# Patient Record
Sex: Male | Born: 1950 | Race: White | Hispanic: No | Marital: Single | Smoking: Never smoker
Health system: Southern US, Community
[De-identification: ages and names within clinical notes are randomized; demographics above are authoritative.]

## PROBLEM LIST (undated history)

## (undated) DIAGNOSIS — R3911 Hesitancy of micturition: Secondary | ICD-10-CM

## (undated) DIAGNOSIS — M75102 Unspecified rotator cuff tear or rupture of left shoulder, not specified as traumatic: Secondary | ICD-10-CM

## (undated) DIAGNOSIS — I712 Thoracic aortic aneurysm, without rupture: Secondary | ICD-10-CM

## (undated) DIAGNOSIS — I6529 Occlusion and stenosis of unspecified carotid artery: Secondary | ICD-10-CM

## (undated) DIAGNOSIS — E785 Hyperlipidemia, unspecified: Secondary | ICD-10-CM

## (undated) DIAGNOSIS — E291 Testicular hypofunction: Secondary | ICD-10-CM

## (undated) DIAGNOSIS — Z8709 Personal history of other diseases of the respiratory system: Secondary | ICD-10-CM

## (undated) DIAGNOSIS — M8580 Other specified disorders of bone density and structure, unspecified site: Secondary | ICD-10-CM

## (undated) DIAGNOSIS — H9319 Tinnitus, unspecified ear: Secondary | ICD-10-CM

## (undated) DIAGNOSIS — I10 Essential (primary) hypertension: Secondary | ICD-10-CM

## (undated) DIAGNOSIS — E559 Vitamin D deficiency, unspecified: Secondary | ICD-10-CM

## (undated) DIAGNOSIS — R351 Nocturia: Secondary | ICD-10-CM

## (undated) DIAGNOSIS — IMO0001 Reserved for inherently not codable concepts without codable children: Secondary | ICD-10-CM

## (undated) DIAGNOSIS — N400 Enlarged prostate without lower urinary tract symptoms: Secondary | ICD-10-CM

## (undated) DIAGNOSIS — M1990 Unspecified osteoarthritis, unspecified site: Secondary | ICD-10-CM

## (undated) DIAGNOSIS — R200 Anesthesia of skin: Secondary | ICD-10-CM

## (undated) DIAGNOSIS — R350 Frequency of micturition: Secondary | ICD-10-CM

## (undated) DIAGNOSIS — S68119A Complete traumatic metacarpophalangeal amputation of unspecified finger, initial encounter: Secondary | ICD-10-CM

## (undated) DIAGNOSIS — M545 Low back pain, unspecified: Secondary | ICD-10-CM

## (undated) DIAGNOSIS — G8929 Other chronic pain: Secondary | ICD-10-CM

## (undated) HISTORY — DX: Other chronic pain: G89.29

## (undated) HISTORY — DX: Vitamin D deficiency, unspecified: E55.9

## (undated) HISTORY — PX: ANTERIOR FUSION CERVICAL SPINE: SUR626

## (undated) HISTORY — DX: Hyperlipidemia, unspecified: E78.5

## (undated) HISTORY — DX: Low back pain, unspecified: M54.50

## (undated) HISTORY — DX: Testicular hypofunction: E29.1

## (undated) HISTORY — DX: Unspecified rotator cuff tear or rupture of left shoulder, not specified as traumatic: M75.102

## (undated) HISTORY — DX: Unspecified osteoarthritis, unspecified site: M19.90

## (undated) HISTORY — DX: Essential (primary) hypertension: I10

## (undated) HISTORY — DX: Other specified disorders of bone density and structure, unspecified site: M85.80

## (undated) HISTORY — DX: Occlusion and stenosis of unspecified carotid artery: I65.29

## (undated) HISTORY — PX: PROSTATE CRYOABLATION: SUR358

## (undated) HISTORY — PX: CARPAL TUNNEL RELEASE: SHX101

## (undated) HISTORY — DX: Low back pain: M54.5

## (undated) HISTORY — PX: ROTATOR CUFF REPAIR: SHX139

## (undated) HISTORY — DX: Thoracic aortic aneurysm, without rupture: I71.2

## (undated) HISTORY — DX: Benign prostatic hyperplasia without lower urinary tract symptoms: N40.0

## (undated) HISTORY — DX: Complete traumatic metacarpophalangeal amputation of unspecified finger, initial encounter: S68.119A

---
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
## 2001-06-09 HISTORY — PX: COLONOSCOPY: SHX174

## 2005-06-05 ENCOUNTER — Encounter: Admission: RE | Admit: 2005-06-05 | Discharge: 2005-06-05 | Payer: Self-pay | Admitting: Family Medicine

## 2005-06-10 ENCOUNTER — Encounter: Admission: RE | Admit: 2005-06-10 | Discharge: 2005-06-10 | Payer: Self-pay | Admitting: Family Medicine

## 2005-09-05 ENCOUNTER — Ambulatory Visit (HOSPITAL_COMMUNITY): Admission: RE | Admit: 2005-09-05 | Discharge: 2005-09-06 | Payer: Self-pay | Admitting: Neurosurgery

## 2007-10-13 ENCOUNTER — Encounter: Admission: RE | Admit: 2007-10-13 | Discharge: 2007-10-13 | Payer: Self-pay | Admitting: Internal Medicine

## 2008-01-27 ENCOUNTER — Ambulatory Visit (HOSPITAL_COMMUNITY): Admission: RE | Admit: 2008-01-27 | Discharge: 2008-01-28 | Payer: Self-pay | Admitting: Specialist

## 2008-09-30 ENCOUNTER — Encounter: Admission: RE | Admit: 2008-09-30 | Discharge: 2008-09-30 | Payer: Self-pay | Admitting: Neurosurgery

## 2008-10-09 ENCOUNTER — Emergency Department (HOSPITAL_COMMUNITY): Admission: EM | Admit: 2008-10-09 | Discharge: 2008-10-09 | Payer: Self-pay | Admitting: Emergency Medicine

## 2009-09-07 HISTORY — PX: OTHER SURGICAL HISTORY: SHX169

## 2010-06-09 HISTORY — PX: SPIROMETRY: SHX456

## 2010-10-22 NOTE — Op Note (Signed)
NAMEFRANCHOT, POLLITT               ACCOUNT NO.:  [id number]   MEDICAL RECORD NO.:  [id number]          PATIENT TYPE:  AMB   LOCATION:  DAY                          FACILITY:  [facility]   PHYSICIAN:  Jene Every, M.D.    DATE OF BIRTH:  1951-05-07   DATE OF PROCEDURE:  01/27/2008  DATE OF DISCHARGE:                               OPERATIVE REPORT   PREOPERATIVE DIAGNOSES:  Massive rotator cuff tear left shoulder with  impingement syndrome.   POSTOPERATIVE DIAGNOSES:  Massive rotator cuff tear left shoulder with  impingement syndrome.   PROCEDURE PERFORMED:  1. Repair of massive rotator cuff tear, left shoulder.  2. Subacromial decompression with acromioplasty.  3. Debridement of labral tear with lavaged of glenohumeral joint.   ANESTHESIA:  General.   ASSISTANT:  Strader.   I used a push lock suture anchor system.   BRIEF HISTORY AND INDICATIONS:  This gentleman is status post a rotator  cuff tear sustained at work.  He is 60 years old, right hand dominant.  Confirmed by MRI.  It was indicated for repair.  The risks and benefits  discussed, including bleeding, infection, suboptimal range of motion,  need for immobilization, prolonged recovery, DVT, PE, and anesthetic  complications, etc.   TECHNIQUE:  The patient in supine in the beach-chair position.  After  the induction of adequate anesthesia and 1 gm of Kefzol, the left  shoulder and upper extremity was prepped and draped in the usual sterile  fashion.  A surgical marker was utilized to delineate the acromion and  an incision was made over the anterolateral aspect of the acromion.  Subcutaneous tissue was dissected.  Electrocautery was utilized to  achieve hemostasis.  The raphe between the anterolateral heads of the  deltoid were subperiosteally elevated over the anterolateral aspect of  the acromion, preserving the attachment.  The CA ligament was divided  and excised.  Acromioplasty was performed with the deltoid  protected  utilizing the oscillating saw.  The glenohumeral joint was debrided and  lavaged.  A massive rotator cuff tear was noted, retracted to the  glenohumeral joint.  The glenohumeral joint showed some arthritic  changes.  This was debrided and this was lavaged.  I then mobilized the  rotator cuff.  Utilizing a combination of subacromial digital lysis and  the Cobb, elevating the anterior aspect of the cuffs attachment to the  glenoid.  Next, a bed in the medial to the greater tuberosity was  prepared utilizing a Baer rongeur and a curette.  Good bleeding bone was  obtained.  There was a tear in the subscap as well.  I then placed two  suture anchors utilizing an awl just lateral to the articular surface  into the bone, and then a 5/5 suture anchor with good purchase, unable  to dislodge.  The suture leaflets were then threaded into the subscap,  the supraspinatus anteriorly and laterally, the first suture anchor and  into the posterior aspect of the supraspinatus with the second suture  anchor.  Two of these were utilized.  The tendon was advanced with the  arm  abducted.  We tied the first leaflet down to the suture anchor,  fixing the subscap.  We then tied the second two down transfixing the  supraspinatus.  This was advanced to the bed just 1-2 mm medial to the  greater tuberosity without excessive tension.  We then crossed the  suture ends and then utilized a push lock over the outer aspect of the  greater tuberosity, preparing it with an awl and then inserting the 4-5  push lock, threading the threads through that.  With tension on that,  that then further matted down the rotator cuff.  With excellent fixation  and coverage of the head, we ranged the shoulder and there was full  coverage noted.  The actual tendon itself once advanced was fairly thick  and of good quality.  The biceps tendon was also intact.  The wound was  then copiously irrigated, and I repaired the raphe with  #1 Vicryl  interrupted figure-of-eight sutures over and through the acromion, subcu  with 2-0 Vicryl simple sutures.  The skin was reapproximated for 4-0  subcuticular Prolene.  The wound reinforced with Steri-Strips.  Sterile  dressing applied.  I placed him in a large abduction pillow, extubated  without difficulty and transported to the recovery room in satisfactory  condition.   The patient tolerated the procedure well with no complications.      Jene Every, M.D.  Electronically Signed     JB/MEDQ  D:  01/27/2008  T:  01/27/2008  Job:  [id number]

## 2010-10-25 NOTE — Op Note (Signed)
Gregory Fisher, Gregory Fisher               ACCOUNT NO.:  [id number]   MEDICAL RECORD NO.:  [id number]          PATIENT TYPE:  AMB   LOCATION:  SDS                          FACILITY:  [facility]   PHYSICIAN:  Henry A. Pool, M.D.    DATE OF BIRTH:  01/31/1951   DATE OF PROCEDURE:  09/05/2005  DATE OF DISCHARGE:                                 OPERATIVE REPORT   SERVICE:  Neurosurgery.   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   PROCEDURE:  C6-7 anterior cervical diskectomy and fusion with allograft and  anterior plating.   SURGEON:  Kathaleen Maser. Pool, M.D.   ASSISTANT:  Tia Alert, M.D.   ANESTHESIA:  General endotracheal.   INDICATION:  Mr. Slatten is a 60 year old male who is status post previous  C5-6 anterior cervical diskectomy and fusion done by another physician  approximately 16 years ago.  The patient presents now with neck and left  upper extremity pain, paresthesias and weakness and tenderness on the left  side with C7 radiculopathy.  Workup demonstrates evidence of a left-sided C6-  7 disk herniation and compression of the spinal cord and exiting left-sided  C7 nerve root.  The patient has been counseled as to his options.  He  decided with a C6-7 anterior cervical diskectomy and fusion, allografting  and anterior plating in hopes of improving his symptoms.   DESCRIPTION OF PROCEDURE:  The patient was taken to the operating room and  placed on the table in a supine position.  After adequate level of  anesthesia was achieved, the patient was positioned supine with his neck  slightly extended and head placed in halter traction.  The patient's  anterior cervical region was prepped and draped sterilely.  A 10 blade was  used to make a linear skin incision overlying the C6-7 interspace.  This was  carried down sharply to the platysma.  The platysma was  then divided  vertically and dissection proceeded along the medial border of the  sternocleidomastoid muscle and carotid sheath.  Trachea and esophagus were  mobilized and retracted towards the left.  The prevertebral fascia was  stripped off the anterior spinal column.  The longus colli muscle was then  elevated bilaterally using electrocautery.  A deep self-retaining retractor  was placed and intraoperative fluoroscopy used; the C6-7 level was  confirmed.  The disk space was then incised with a 15 blade in rectangular  fashion.  A wide disk space cleanout was then achieved using pituitary  rongeurs, forward- and back-biting Carlen curettes, Kerrison rongeurs and a  high-speed drill until all elements of disk were removed down to the level  of the posterior annulus.  The microscope was then brought into the field  and used for microdissection throughout the remainder of the diskectomy.  The remaining aspects of the annulus and osteophytes were removed using a  high-speed drill down to the level of the posterior longitudinal ligament.  The posterior longitudinal ligament was then elevated and resected in  piecemeal fashion using Kerrison rongeurs.  The underlying thecal sac was  then divided and a wide central decompression was then performed,  undercutting the bodies of C6 and C7 using Kerrison rongeurs.  All elements  of disk herniation were resected along the way.  Decompression then  proceeded out into each neural foramen.  A wide anterior foraminotomy was  then performed along the previously exciting C7 nerve roots bilaterally.  Wound was then irrigated with antibiotic solution.  Gelfoam was placed  topically for hemostasis, which was found to be good.  A 6-mm LifeNet  allograft wedge was then impacted into place and recessed approximately 1 mm  from the anterior cortical margin.  A 25-mm Prestige anterior cervical plate  was then placed over the C6 and C7 levels.  This was then  attached under  fluoroscopic guidance using 13-mm variable-angle screws, 2 each at both  levels.  All 4 screws were given a final tightening and found to be solid  within bone.  Locking screws were then engaged.  Final images revealed good  position of bone grafts and hardware at proper operative levels and normal  alignment of the spine.  Wound was then irrigated with antibiotic solution.  Gelfoam was placed topically for hemostasis, which was found to be good.  Wound was then closed in typical fashion.  Steri-Strips and sterile dressing  were applied.  There were no apparent complications.  The patient tolerated  the procedure well and he returns to the recovery room postoperatively.           ______________________________  Kathaleen Maser Pool, M.D.     HAP/MEDQ  D:  09/05/2005  T:  09/06/2005  Job:  [id number]

## 2011-03-07 LAB — BASIC METABOLIC PANEL
CO2: 31
Calcium: 10
Creatinine, Ser: 1.18
GFR calc Af Amer: >60
GFR calc non Af Amer: >60
Sodium: 146 — ABNORMAL HIGH

## 2011-03-07 LAB — DIFFERENTIAL
Lymphocytes Relative: 21
Lymphs Abs: 1.2
Monocytes Absolute: 0.5
Monocytes Relative: 9
Neutro Abs: 3.9
Neutrophils Relative %: 69

## 2011-03-07 LAB — CBC
Hemoglobin: 14.4
MCHC: 34.4
RBC: 4.49
WBC: 5.7

## 2011-04-10 HISTORY — PX: US ECHOCARDIOGRAPHY: HXRAD669

## 2011-11-26 ENCOUNTER — Other Ambulatory Visit: Payer: Self-pay | Admitting: Specialist

## 2011-11-26 DIAGNOSIS — M25512 Pain in left shoulder: Secondary | ICD-10-CM

## 2011-12-04 ENCOUNTER — Ambulatory Visit
Admission: RE | Admit: 2011-12-04 | Discharge: 2011-12-04 | Disposition: A | Discharge status: Home / Self Care | Payer: BC Managed Care – PPO | Source: Ambulatory Visit | Attending: Specialist | Admitting: Specialist

## 2011-12-04 DIAGNOSIS — M25512 Pain in left shoulder: Secondary | ICD-10-CM

## 2011-12-04 MED ORDER — IOHEXOL 180 MG/ML  SOLN
1.5000 mL | Freq: Once | INTRAMUSCULAR | Status: AC | PRN
  Administered 2011-12-04: 1.5 mL via INTRA_ARTICULAR

## 2012-02-05 LAB — COMPREHENSIVE METABOLIC PANEL
AST: 21 U/L
Alkaline Phosphatase: 62 U/L
Calcium: 10.2 mg/dL
Creat: 1
Potassium: 4 mmol/L
Total Bilirubin: 0.7 mg/dL

## 2012-02-05 LAB — LIPID PANEL
Cholesterol: 165 mg/dL (ref 0–200)
HDL: 59 mg/dL (ref 35–70)
Triglycerides: 60

## 2012-02-05 LAB — CBC: platelet count: 254

## 2012-02-05 LAB — VITAMIN D 25 HYDROXY (VIT D DEFICIENCY, FRACTURES): Vit D, 25-Hydroxy: 35.97

## 2012-09-03 ENCOUNTER — Telehealth: Payer: Self-pay

## 2012-09-03 MED ORDER — CARVEDILOL 12.5 MG PO TABS: 12.5000 mg | ORAL_TABLET | Freq: Two times a day (BID) | ORAL | Status: DC

## 2012-09-03 MED ORDER — VALSARTAN-HYDROCHLOROTHIAZIDE 320-12.5 MG PO TABS: 1.0000 | ORAL_TABLET | Freq: Every day | ORAL | Status: DC

## 2012-09-03 NOTE — Telephone Encounter (Signed)
Pt request refill of Valsartan HCT 320/12.5 mg taking one daily and Carvedilol 12.5 mg taking one tab twice a day. [facility]. Advised pt since he was a new pt would need to ck with the doctor he has been seeing for refill. Pt said that doctor will want him to come in for CPX. Lyla Son put pt on list to call if cancellation for new pt. Prior to pt scheduled appt on 10/19/12. Please advise.

## 2012-09-03 NOTE — Telephone Encounter (Signed)
Patient notified as instructed by telephone. Pt has lived here all his life. Pt will ck with previous dr first and call back if further need.

## 2012-09-03 NOTE — Telephone Encounter (Signed)
Pt said previous dr will not refill med without being seen. Pt request meds sent to [facility]; pt is out of Carvedilol.Please advise.

## 2012-09-03 NOTE — Addendum Note (Signed)
Addended by: Eustaquio Boyden on: 09/03/2012 05:58 PM   Modules accepted: Orders

## 2012-09-03 NOTE — Telephone Encounter (Signed)
Is he local or did he move from afar? If local, recommend he first check with prior PCP for 2 mo refill while he establishes here.  If they refuse, we may fill. If from far away, may refill 1 mo supply with 1 refill.

## 2012-09-03 NOTE — Telephone Encounter (Signed)
Meds refilled x 2 mo. Notified patient.

## 2012-10-19 ENCOUNTER — Ambulatory Visit (INDEPENDENT_AMBULATORY_CARE_PROVIDER_SITE_OTHER): Payer: BC Managed Care – PPO | Admitting: Family Medicine

## 2012-10-19 ENCOUNTER — Encounter: Payer: Self-pay | Admitting: Family Medicine

## 2012-10-19 VITALS — BP 126/84 | HR 76 | Temp 98.2°F | Ht 68.0 in | Wt 225.0 lb

## 2012-10-19 DIAGNOSIS — N400 Enlarged prostate without lower urinary tract symptoms: Secondary | ICD-10-CM

## 2012-10-19 DIAGNOSIS — I10 Essential (primary) hypertension: Secondary | ICD-10-CM

## 2012-10-19 DIAGNOSIS — I712 Thoracic aortic aneurysm, without rupture, unspecified: Secondary | ICD-10-CM

## 2012-10-19 DIAGNOSIS — I6529 Occlusion and stenosis of unspecified carotid artery: Secondary | ICD-10-CM

## 2012-10-19 DIAGNOSIS — M75102 Unspecified rotator cuff tear or rupture of left shoulder, not specified as traumatic: Secondary | ICD-10-CM

## 2012-10-19 DIAGNOSIS — M8580 Other specified disorders of bone density and structure, unspecified site: Secondary | ICD-10-CM | POA: Insufficient documentation

## 2012-10-19 DIAGNOSIS — E559 Vitamin D deficiency, unspecified: Secondary | ICD-10-CM

## 2012-10-19 DIAGNOSIS — E785 Hyperlipidemia, unspecified: Secondary | ICD-10-CM

## 2012-10-19 DIAGNOSIS — S43429A Sprain of unspecified rotator cuff capsule, initial encounter: Secondary | ICD-10-CM

## 2012-10-19 DIAGNOSIS — M899 Disorder of bone, unspecified: Secondary | ICD-10-CM

## 2012-10-19 DIAGNOSIS — M949 Disorder of cartilage, unspecified: Secondary | ICD-10-CM

## 2012-10-19 NOTE — Patient Instructions (Addendum)
Sign one more release form today. Return at your convenience fasting for blood work and afterwards for physical. Good to meet you today, call us with quesitons. Look into taking aspirin 81mg  enteric coated several times a week.
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
## 2012-10-19 NOTE — Assessment & Plan Note (Signed)
Chronic, stable as of last blood work.  Continue high dose simvastatin

## 2012-10-19 NOTE — Assessment & Plan Note (Signed)
On last dexa.  Compliant with vit D and calcium daily. H/o vit d def.

## 2012-10-19 NOTE — Assessment & Plan Note (Signed)
Chronic, stable. Continue meds. Discussed avoiding sudden discontinuation of clonidine.
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
## 2012-10-19 NOTE — Assessment & Plan Note (Addendum)
I have requested and will review last echo report, then determine need for f/u based on this result. Discussed with pt possible increased risk of future thoracic aorta problems and possible need for further f/u.

## 2012-10-19 NOTE — Assessment & Plan Note (Signed)
I have requested records of latest carotid US from [facility]

## 2012-10-19 NOTE — Assessment & Plan Note (Signed)
On disability for this. Ortho is Dr. Thomasena Edis at [facility].

## 2012-11-19 ENCOUNTER — Other Ambulatory Visit: Payer: Self-pay | Admitting: Family Medicine

## 2012-11-23 ENCOUNTER — Encounter: Payer: Self-pay | Admitting: Family Medicine

## 2012-12-06 ENCOUNTER — Encounter: Payer: Self-pay | Admitting: Family Medicine

## 2012-12-13 ENCOUNTER — Other Ambulatory Visit: Payer: Self-pay

## 2012-12-13 ENCOUNTER — Other Ambulatory Visit: Payer: Self-pay | Admitting: Family Medicine

## 2012-12-13 NOTE — Telephone Encounter (Signed)
Pt has changed insurance and pt request new written rx for carvedilol and valsartan-HCTZ to be attached to [employer] form that is at [employer] and that form with prescriptions to be mailed to [employer].Please advise.

## 2012-12-15 MED ORDER — CARVEDILOL 12.5 MG PO TABS: ORAL_TABLET | ORAL | Status: DC

## 2012-12-15 MED ORDER — VALSARTAN-HYDROCHLOROTHIAZIDE 320-12.5 MG PO TABS: ORAL_TABLET | ORAL | Status: DC

## 2012-12-15 NOTE — Telephone Encounter (Signed)
Printed and placed in Kim's box. 

## 2012-12-15 NOTE — Telephone Encounter (Signed)
Rx's and form mailed per request.

## 2012-12-20 ENCOUNTER — Other Ambulatory Visit: Payer: Self-pay

## 2012-12-20 MED ORDER — SIMVASTATIN 80 MG PO TABS: 80.0000 mg | ORAL_TABLET | Freq: Every day | ORAL | Status: DC

## 2012-12-20 NOTE — Telephone Encounter (Signed)
Pt request refill simvastatin to [facility]. Advise done.

## 2012-12-20 NOTE — Telephone Encounter (Signed)
Pt wanted to ck status of refill advised mailed on 12/15/12.

## 2013-01-07 DIAGNOSIS — I6529 Occlusion and stenosis of unspecified carotid artery: Secondary | ICD-10-CM

## 2013-01-07 HISTORY — DX: Occlusion and stenosis of unspecified carotid artery: I65.29

## 2013-01-18 ENCOUNTER — Telehealth: Payer: Self-pay

## 2013-01-18 NOTE — Telephone Encounter (Signed)
Pt has refills available for simvastatin,carvedilol and valsartan to [facility]; [facility] to let [facility] know time for example carvedilol needs to be refilled. pts insurance has changed requiring pt to use mail order pharmacy for long term meds. [facility] and let them know to take off auto refill except if pt needs immediate filling of medication like an antibiotic or pain med.

## 2013-01-19 ENCOUNTER — Ambulatory Visit (INDEPENDENT_AMBULATORY_CARE_PROVIDER_SITE_OTHER): Payer: BC Managed Care – PPO | Admitting: Family Medicine

## 2013-01-19 ENCOUNTER — Encounter: Payer: Self-pay | Admitting: Family Medicine

## 2013-01-19 VITALS — BP 104/72 | HR 80 | Temp 98.0°F | Ht 67.75 in | Wt 221.5 lb

## 2013-01-19 DIAGNOSIS — E559 Vitamin D deficiency, unspecified: Secondary | ICD-10-CM

## 2013-01-19 DIAGNOSIS — Z0000 Encounter for general adult medical examination without abnormal findings: Secondary | ICD-10-CM

## 2013-01-19 DIAGNOSIS — Z23 Encounter for immunization: Secondary | ICD-10-CM

## 2013-01-19 DIAGNOSIS — M899 Disorder of bone, unspecified: Secondary | ICD-10-CM

## 2013-01-19 DIAGNOSIS — E785 Hyperlipidemia, unspecified: Secondary | ICD-10-CM

## 2013-01-19 DIAGNOSIS — I10 Essential (primary) hypertension: Secondary | ICD-10-CM

## 2013-01-19 DIAGNOSIS — Z1211 Encounter for screening for malignant neoplasm of colon: Secondary | ICD-10-CM

## 2013-01-19 DIAGNOSIS — I6523 Occlusion and stenosis of bilateral carotid arteries: Secondary | ICD-10-CM

## 2013-01-19 DIAGNOSIS — I6529 Occlusion and stenosis of unspecified carotid artery: Secondary | ICD-10-CM

## 2013-01-19 DIAGNOSIS — M8580 Other specified disorders of bone density and structure, unspecified site: Secondary | ICD-10-CM

## 2013-01-19 DIAGNOSIS — I658 Occlusion and stenosis of other precerebral arteries: Secondary | ICD-10-CM

## 2013-01-19 DIAGNOSIS — N400 Enlarged prostate without lower urinary tract symptoms: Secondary | ICD-10-CM

## 2013-01-19 DIAGNOSIS — I712 Thoracic aortic aneurysm, without rupture, unspecified: Secondary | ICD-10-CM

## 2013-01-19 LAB — LIPID PANEL
HDL: 44.3 mg/dL (ref >39.00)
Total CHOL/HDL Ratio: 3

## 2013-01-19 LAB — COMPREHENSIVE METABOLIC PANEL
ALT: 25 U/L (ref 0–53)
AST: 21 U/L (ref 0–37)
CO2: 32 mEq/L (ref 19–32)
Chloride: 100 mEq/L (ref 96–112)
GFR: 58.98 mL/min — ABNORMAL LOW (ref >60.00)
Sodium: 138 mEq/L (ref 135–145)
Total Bilirubin: 1 mg/dL (ref 0.3–1.2)
Total Protein: 7.1 g/dL (ref 6.0–8.3)

## 2013-01-19 LAB — CBC WITH DIFFERENTIAL/PLATELET
Basophils Absolute: 0 10*3/uL (ref 0.0–0.1)
Eosinophils Absolute: 0.1 10*3/uL (ref 0.0–0.7)
Hemoglobin: 15.8 g/dL (ref 13.0–17.0)
Lymphocytes Relative: 17.6 % (ref 12.0–46.0)
Lymphs Abs: 1.1 10*3/uL (ref 0.7–4.0)
MCHC: 34.1 g/dL (ref 30.0–36.0)
MCV: 94.3 fl (ref 78.0–100.0)
Monocytes Absolute: 0.7 10*3/uL (ref 0.1–1.0)
Neutro Abs: 4.3 10*3/uL (ref 1.4–7.7)
RDW: 12.6 % (ref 11.5–14.6)

## 2013-01-19 NOTE — Assessment & Plan Note (Signed)
Chronic. Stable on simvastatin. Check FLP today.

## 2013-01-19 NOTE — Assessment & Plan Note (Signed)
Stable. Continue flomax.  DRE/PSA today.

## 2013-01-19 NOTE — Assessment & Plan Note (Signed)
Per prior records.  I will order another carotid US to assess.  No bruit on exam today.

## 2013-01-19 NOTE — Assessment & Plan Note (Signed)
Preventative protocols reviewed and updated unless pt declined. Discussed healthy diet and lifestyle.  

## 2013-01-19 NOTE — Assessment & Plan Note (Signed)
Chronic, stable. Continue meds.  Today bp somewhat soft - I advised him to monitor at local pharmacy and notify me if remaining low for decrease in clonidine.   Pt agrees with plan.

## 2013-01-19 NOTE — Assessment & Plan Note (Signed)
No details of this.  Currently stable.

## 2013-01-19 NOTE — Addendum Note (Signed)
Addended by: Sydell Axon C on: 01/19/2013 09:57 AM   Modules accepted: Orders

## 2013-01-19 NOTE — Assessment & Plan Note (Signed)
Continue calcium/vit D supplementation. Last dexa 2011 with mild osteopenia.  H/o hypogonadism in past.

## 2013-01-19 NOTE — Assessment & Plan Note (Signed)
Last check Vit D level 35.
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
## 2013-01-19 NOTE — Patient Instructions (Addendum)
Tdap today (tetanus and pertussis immunization).  Blood work today. Pass by Marion's office to schedule ultrasound of carotid arteries. Let's keep an eye on blood pressure at local pharmacy - if top number persistently <110, let me know to change clonidine. No med changes today. Call your insurance about the shingles shot to see if it is covered or how much it would cost and where is cheaper (here or pharmacy).  If you want to receive here, call for nurse visit. Good to see you today, return to see me in 6 months for follow up.

## 2013-01-20 ENCOUNTER — Other Ambulatory Visit: Payer: BC Managed Care – PPO

## 2013-01-20 ENCOUNTER — Telehealth: Payer: Self-pay | Admitting: Family Medicine

## 2013-01-20 DIAGNOSIS — Z1211 Encounter for screening for malignant neoplasm of colon: Secondary | ICD-10-CM

## 2013-01-20 LAB — FECAL OCCULT BLOOD, IMMUNOCHEMICAL: Fecal Occult Bld: NEGATIVE

## 2013-01-20 NOTE — Telephone Encounter (Signed)
Patient Information:  Caller Name: Albert  Phone: [phone]  Patient: Gregory Fisher, Gregory Fisher  Gender: Male  DOB: 31-May-1951  Age: 62 Years  PCP: Eustaquio Boyden [facility])  Office Follow Up:  Does the office need to follow up with this patient?: No  Instructions For The Office: N/A  RN Note:  Per EPIC advised pt's FBS 115.  Advised pt of food high in sugar and carb content.  Symptoms  Reason For Call & Symptoms: Pt states he received a message from the office to return call to get his result of his lab work.  Pt has a questions about his Blood sugar.  Reviewed Health History In EMR: Yes  Reviewed Medications In EMR: Yes  Reviewed Allergies In EMR: Yes  Reviewed Surgeries / Procedures: Yes  Date of Onset of Symptoms: 01/20/2013  Guideline(s) Used:  No Protocol Available - Information Only  Disposition Per Guideline:   Home Care  Reason For Disposition Reached:   Information only question and nurse able to answer  Advice Given:  Call Back If:  New symptoms develop  You become worse.  Patient Will Follow Care Advice:  YES

## 2013-01-21 ENCOUNTER — Encounter: Payer: Self-pay | Admitting: Family Medicine

## 2013-01-21 ENCOUNTER — Encounter: Payer: Self-pay | Admitting: *Deleted

## 2013-01-21 LAB — FECAL OCCULT BLOOD, GUAIAC: Fecal Occult Blood: NEGATIVE

## 2013-01-24 ENCOUNTER — Encounter (INDEPENDENT_AMBULATORY_CARE_PROVIDER_SITE_OTHER): Payer: BC Managed Care – PPO

## 2013-01-24 DIAGNOSIS — I6523 Occlusion and stenosis of bilateral carotid arteries: Secondary | ICD-10-CM

## 2013-01-24 DIAGNOSIS — I6529 Occlusion and stenosis of unspecified carotid artery: Secondary | ICD-10-CM

## 2013-01-26 ENCOUNTER — Encounter: Payer: Self-pay | Admitting: Family Medicine

## 2013-03-07 ENCOUNTER — Emergency Department (HOSPITAL_COMMUNITY)
Admission: EM | Admit: 2013-03-07 | Discharge: 2013-03-07 | Disposition: A | Discharge status: Home / Self Care | Payer: BC Managed Care – PPO | Source: Home / Self Care | Attending: Family Medicine | Admitting: Family Medicine

## 2013-03-07 ENCOUNTER — Encounter (HOSPITAL_COMMUNITY): Payer: Self-pay

## 2013-03-07 DIAGNOSIS — F5102 Adjustment insomnia: Secondary | ICD-10-CM

## 2013-03-07 DIAGNOSIS — I10 Essential (primary) hypertension: Secondary | ICD-10-CM

## 2013-03-07 MED ORDER — FUROSEMIDE 40 MG PO TABS
ORAL_TABLET | ORAL | Status: AC
  Filled 2013-03-07: qty 1

## 2013-03-07 MED ORDER — ZOLPIDEM TARTRATE 5 MG PO TABS: 5.0000 mg | ORAL_TABLET | Freq: Every evening | ORAL | Status: DC | PRN

## 2013-03-07 MED ORDER — FUROSEMIDE 40 MG PO TABS
40.0000 mg | ORAL_TABLET | Freq: Once | ORAL | Status: AC
Start: 2013-03-07 — End: 2013-03-07
  Administered 2013-03-07: 40 mg via ORAL

## 2013-03-07 NOTE — ED Notes (Signed)
Reportedly ate 2 lb of on sale Polish sausage in 2 days, and since then has not felt right, BP readings have been elevated, in spite of having used extra doses of Rx medication in an attempt to bring it down
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
## 2013-03-08 ENCOUNTER — Telehealth: Payer: Self-pay

## 2013-03-08 ENCOUNTER — Ambulatory Visit (INDEPENDENT_AMBULATORY_CARE_PROVIDER_SITE_OTHER): Payer: BC Managed Care – PPO | Admitting: Family Medicine

## 2013-03-08 ENCOUNTER — Encounter: Payer: Self-pay | Admitting: Family Medicine

## 2013-03-08 VITALS — BP 144/104 | HR 88 | Temp 98.3°F | Wt 222.5 lb

## 2013-03-08 DIAGNOSIS — I10 Essential (primary) hypertension: Secondary | ICD-10-CM

## 2013-03-08 DIAGNOSIS — Z23 Encounter for immunization: Secondary | ICD-10-CM

## 2013-03-08 MED ORDER — CLONIDINE HCL 0.1 MG PO TABS: 0.1000 mg | ORAL_TABLET | Freq: Two times a day (BID) | ORAL | Status: DC

## 2013-03-08 NOTE — Telephone Encounter (Signed)
Has appt this AM.  

## 2013-03-08 NOTE — Patient Instructions (Addendum)
continue carvedilol twice daily and continue valsartan hct once daily as prior. Let's decrease clonidine to 0.1mg  twice daily every day. Continue monitoring blood pressure over next week - once to twice daily - and give me a call with numbers next week. Good to see you today, return to see me in 1 month for blood pressure follow up. Look into mediterranean diet and low blood pressure diet below.  DASH Diet The DASH diet stands for "Dietary Approaches to Stop Hypertension." It is a healthy eating plan that has been shown to reduce high blood pressure (hypertension) in as little as 14 days, while also possibly providing other significant health benefits. These other health benefits include reducing the risk of breast cancer after menopause and reducing the risk of type 2 diabetes, heart disease, colon cancer, and stroke. Health benefits also include weight loss and slowing kidney failure in patients with chronic kidney disease.  DIET GUIDELINES  Limit salt (sodium). Your diet should contain less than 1500 mg of sodium daily.  Limit refined or processed carbohydrates. Your diet should include mostly whole grains. Desserts and added sugars should be used sparingly.  Include small amounts of heart-healthy fats. These types of fats include nuts, oils, and tub margarine. Limit saturated and trans fats. These fats have been shown to be harmful in the body. CHOOSING FOODS  The following food groups are based on a 2000 calorie diet. See your Registered Dietitian for individual calorie needs. Grains and Grain Products (6 to 8 servings daily)  Eat More Often: Whole-wheat bread, brown rice, whole-grain or wheat pasta, quinoa, popcorn without added fat or salt (air popped).  Eat Less Often: White bread, white pasta, white rice, cornbread. Vegetables (4 to 5 servings daily)  Eat More Often: Fresh, frozen, and canned vegetables. Vegetables may be raw, steamed, roasted, or grilled with a minimal amount of  fat.  Eat Less Often/Avoid: Creamed or fried vegetables. Vegetables in a cheese sauce. Fruit (4 to 5 servings daily)  Eat More Often: All fresh, canned (in natural juice), or frozen fruits. Dried fruits without added sugar. One hundred percent fruit juice ( cup [237 mL] daily).  Eat Less Often: Dried fruits with added sugar. Canned fruit in light or heavy syrup. [employer], Fish, and Poultry (2 servings or less daily. One serving is 3 to 4 oz [85-114 g]).  Eat More Often: Ninety percent or leaner ground beef, tenderloin, sirloin. Round cuts of beef, chicken breast, [place] breast. All fish. Grill, bake, or broil your meat. Nothing should be fried.  Eat Less Often/Avoid: Fatty cuts of meat, [place], or chicken leg, thigh, or wing. Fried cuts of meat or fish. Dairy (2 to 3 servings)  Eat More Often: Low-fat or fat-free milk, low-fat plain or light yogurt, reduced-fat or part-skim cheese.  Eat Less Often/Avoid: Milk (whole, 2%).Whole milk yogurt. Full-fat cheeses. Nuts, Seeds, and Legumes (4 to 5 servings per week)  Eat More Often: All without added salt.  Eat Less Often/Avoid: Salted nuts and seeds, canned beans with added salt. Fats and Sweets (limited)  Eat More Often: Vegetable oils, tub margarines without trans fats, sugar-free gelatin. Mayonnaise and salad dressings.  Eat Less Often/Avoid: Coconut oils, palm oils, butter, stick margarine, cream, half and half, cookies, candy, pie. FOR MORE INFORMATION The Dash Diet Eating Plan: www.dashdiet.org Document Released: 05/15/2011 Document Revised: 08/18/2011 Document Reviewed: 05/15/2011 [facility] Patient Information 2014 [place], [place].

## 2013-03-08 NOTE — Addendum Note (Signed)
Addended by: Josph Macho A on: 03/08/2013 10:53 AM   Modules accepted: Orders

## 2013-03-08 NOTE — Assessment & Plan Note (Addendum)
With recent hypertensive urgency - anticipate brought on by dietary indiscretions and self taper off clonidine leading to rebound HTN. Discussed safety concerns with self tapering antihypertensives. rec start lower dose clonidine 0.1mg  bid regularly, continue coreg and diovan hct at current doses. Anticipate improvement in control with adherence to a more regular regimen. Discussed mediterranean diet and provided with DASH handout today. rtc 1 mo for f/u.

## 2013-03-08 NOTE — Telephone Encounter (Signed)
Triage Record Num: [id number] Operator: Geanie Berlin Patient Name: Gregory Fisher Call Date & Time: 03/07/2013 5:05:26PM Patient Phone: [phone] PCP: Eustaquio Boyden Patient Gender: Male PCP Fax : [phone] Patient DOB: 01-20-1951 Practice Name: [facility] Reason for Call: Caller: [name]/Patient; PCP: Eustaquio Boyden [facility]); CB#: [phone]; Call regarding Elevated blood pressure; Onset: 03/07/13. Afebrile. BP 200/123 and 206/120 in left arm at 17:00. Face feels flushed. [facility]. Has been eating smoked sausage and cheese over past several days. Took three Valsartan/HCTZ today without improvement. Advised to see [facility] now for systolic blood pressure of more than 180 mmHg or diastolic blood pressue of more than 120 mmHg per Hypertesnion Diagnosed or Suspected. Protocol(s) Used: Hypertension, Diagnosed or Suspected Recommended Outcome per Protocol: See Provider within 4 hours Reason for Outcome: Systolic blood pressure of more than 180 mmHg OR diastolic blood pressure of more than 120 mmHg Care Advice: ~ Another adult should drive. Call EMS 911 if new symptoms develop, such as severe shortness of breath, chest pain, change in mental status, acute neurologic deficit, seizure, visual disturbances, pulse rate > 120 / minute, or very irregular pulse. ~ ~ HEALTH PROMOTION / MAINTENANCE ~ CAUTIONS ~ List, or take, all current prescription(s), nonprescription or alternative medication(s) to provider for evaluation. Medication Advice: - Discontinue all nonprescription and alternative medications, especially stimulants, until evaluated by provider. - Take prescribed medications as directed, following label instructions for the medication. - Do not change medications or dosing regimen until provider is consulted. - Know possible side effects of medication and what to do if they occur. - Tell provider all prescription, nonprescription or  alternative medications that you take ~ 03/07/2013 5:20:23PM Page 1 of 1 CAN_
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
## 2013-03-10 ENCOUNTER — Emergency Department (HOSPITAL_COMMUNITY)
Admission: EM | Admit: 2013-03-10 | Discharge: 2013-03-10 | Disposition: A | Discharge status: Home / Self Care | Payer: BC Managed Care – PPO | Source: Home / Self Care | Attending: Family Medicine | Admitting: Family Medicine

## 2013-03-10 ENCOUNTER — Encounter (HOSPITAL_COMMUNITY): Payer: Self-pay | Admitting: Emergency Medicine

## 2013-03-10 DIAGNOSIS — F411 Generalized anxiety disorder: Secondary | ICD-10-CM

## 2013-03-10 DIAGNOSIS — F419 Anxiety disorder, unspecified: Secondary | ICD-10-CM

## 2013-03-10 MED ORDER — CLONAZEPAM 1 MG PO TABS
1.0000 mg | ORAL_TABLET | Freq: Once | ORAL | Status: AC
  Administered 2013-03-10: 1 mg via ORAL
  Filled 2013-03-10: qty 1

## 2013-03-10 MED ORDER — CLONAZEPAM 0.5 MG PO TABS: 0.5000 mg | ORAL_TABLET | Freq: Two times a day (BID) | ORAL | Status: DC | PRN

## 2013-03-10 MED ORDER — CLONAZEPAM 1 MG PO TABS: 1.0000 mg | ORAL_TABLET | Freq: Once | ORAL | Status: DC

## 2013-03-10 NOTE — Discharge Instructions (Signed)
See your doctor on fri for recheck.
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
## 2013-03-10 NOTE — ED Notes (Signed)
C/o blood pressure is elevated.  Patient took new medication an hour ago before coming in. 6pm.

## 2013-03-11 ENCOUNTER — Other Ambulatory Visit: Payer: Self-pay

## 2013-03-11 MED ORDER — CLONAZEPAM 0.5 MG PO TABS: 0.5000 mg | ORAL_TABLET | Freq: Two times a day (BID) | ORAL | Status: DC | PRN

## 2013-03-11 NOTE — Telephone Encounter (Signed)
Pt came by office and left note that was seen [facility] on 03/10/13; pt said BP 233/123; BP went down 110/72 P 76 after taking Klonopin. Revonda Standard scheduled pt 30 min appt with Dr Reece Agar on 03/14/13 at 12 noon.Please advise.

## 2013-03-11 NOTE — Telephone Encounter (Signed)
Phoned in to pharmacy. 

## 2013-03-11 NOTE — Telephone Encounter (Signed)
plz phone in another short course to take until pt sees me.  plz notify patient.

## 2013-03-11 NOTE — Telephone Encounter (Signed)
Pt left v/m requesting refill Klonopin 0.5 mg [facility]; Klonopin 0.5 mg was filled # 10 on 03/10/13 by Dr Artis Flock, pt was seen [facility].Please advise.

## 2013-03-14 ENCOUNTER — Ambulatory Visit (INDEPENDENT_AMBULATORY_CARE_PROVIDER_SITE_OTHER): Payer: BC Managed Care – PPO | Admitting: Family Medicine

## 2013-03-14 ENCOUNTER — Encounter: Payer: Self-pay | Admitting: Family Medicine

## 2013-03-14 VITALS — BP 122/94 | HR 72 | Temp 97.6°F | Wt 221.5 lb

## 2013-03-14 DIAGNOSIS — I712 Thoracic aortic aneurysm, without rupture, unspecified: Secondary | ICD-10-CM

## 2013-03-14 DIAGNOSIS — I10 Essential (primary) hypertension: Secondary | ICD-10-CM

## 2013-03-14 DIAGNOSIS — I6529 Occlusion and stenosis of unspecified carotid artery: Secondary | ICD-10-CM

## 2013-03-14 DIAGNOSIS — I6523 Occlusion and stenosis of bilateral carotid arteries: Secondary | ICD-10-CM

## 2013-03-14 DIAGNOSIS — I658 Occlusion and stenosis of other precerebral arteries: Secondary | ICD-10-CM

## 2013-03-14 DIAGNOSIS — F32A Depression, unspecified: Secondary | ICD-10-CM | POA: Insufficient documentation

## 2013-03-14 DIAGNOSIS — F39 Unspecified mood [affective] disorder: Secondary | ICD-10-CM

## 2013-03-14 MED ORDER — LORAZEPAM 1 MG PO TABS: 1.0000 mg | ORAL_TABLET | Freq: Two times a day (BID) | ORAL | Status: DC | PRN

## 2013-03-14 MED ORDER — QUETIAPINE FUMARATE 50 MG PO TABS: 50.0000 mg | ORAL_TABLET | Freq: Every day | ORAL | Status: DC

## 2013-03-14 NOTE — Assessment & Plan Note (Signed)
Anticipate situational anxiety with recent family stress leading to sudden elevations of blood pressure. Treat this with prn lorazepam - discussed use. Also concerning intrusive thoughts in [facility] mental health - likely depression related.  Will start seroquel in hopes of minimizing this - rtc 3 wks for f/u.   Has not responded well to paxil or zoloft. Pt agrees with plan.

## 2013-03-14 NOTE — Assessment & Plan Note (Signed)
Consider rpt echo to monitor esp in setting of elevated bp

## 2013-03-14 NOTE — Assessment & Plan Note (Signed)
Log of blood pressures he brings over the past week predominantly shows well controlled hypertension.  Only elevated reading was during stressful period where he was talking to sister in law about brother's health issues. Endorses some low bp readings mainly after exercise.  Will recommend continue lower clonidine 0.1mg  bid dose, continue coreg and diovan hct. Keep appt at end of month. See below for stress management.
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
## 2013-03-14 NOTE — Patient Instructions (Addendum)
Continue clonidine and carvedilol twice daily. Continue valsartan hct once daily. May use lorazepam 1mg  1/2 to 1 pill as needed for anxiety/stress. Good to see you today, call us with questions. Start seroquel 50mg  nightly - keep appointment at end of month

## 2013-04-06 ENCOUNTER — Encounter: Payer: Self-pay | Admitting: Radiology

## 2013-04-07 ENCOUNTER — Ambulatory Visit: Payer: BC Managed Care – PPO | Admitting: Family Medicine

## 2013-04-07 DIAGNOSIS — Z0289 Encounter for other administrative examinations: Secondary | ICD-10-CM

## 2013-04-09 HISTORY — PX: US ECHOCARDIOGRAPHY: HXRAD669

## 2013-04-11 ENCOUNTER — Encounter: Payer: Self-pay | Admitting: Family Medicine

## 2013-04-11 ENCOUNTER — Ambulatory Visit (INDEPENDENT_AMBULATORY_CARE_PROVIDER_SITE_OTHER): Payer: BC Managed Care – PPO | Admitting: Family Medicine

## 2013-04-11 VITALS — BP 130/88 | HR 80 | Temp 97.9°F | Wt 227.5 lb

## 2013-04-11 DIAGNOSIS — F39 Unspecified mood [affective] disorder: Secondary | ICD-10-CM

## 2013-04-11 DIAGNOSIS — I712 Thoracic aortic aneurysm, without rupture, unspecified: Secondary | ICD-10-CM

## 2013-04-11 DIAGNOSIS — I10 Essential (primary) hypertension: Secondary | ICD-10-CM

## 2013-04-11 MED ORDER — AMITRIPTYLINE HCL 50 MG PO TABS: 50.0000 mg | ORAL_TABLET | Freq: Every day | ORAL | Status: DC

## 2013-04-11 MED ORDER — LORAZEPAM 1 MG PO TABS
1.0000 mg | ORAL_TABLET | Freq: Two times a day (BID) | ORAL | Status: DC | PRN
Start: 2013-04-11 — End: 2013-06-03

## 2013-04-11 NOTE — Patient Instructions (Signed)
Stop seroquel.  Start amitriptyline for mood and sleep - 1/2 tablet nightly for 4 nights then increase to 1 tablet nightly. Continue lorazepam but only use as needed for anxiety/stress. Continue blood pressure medicines. I will order ultrasound of heart to check on thoracic aortic aneurysm history. Return as needed or in 2-3 months for follow up.

## 2013-04-11 NOTE — Assessment & Plan Note (Signed)
Improved #s as evidenced by blood pressure in office today. Continue meds. Encouraged compliance to low salt diet.

## 2013-04-11 NOTE — Assessment & Plan Note (Signed)
Anticipate situational anxiety with family stress. Continue prn lorazepam as it seems to be helping. Continued homicidal thoughts but pt denies concerns about acting on those thoughts. Start amitriptyline 1/2 tab nightly for 4 days then increase to 50mg  nightly. Poor response to SSRI in past, want to avoid SNRI 2/2 hypertensive effect. F/u in 2-3 months.
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
## 2013-04-28 ENCOUNTER — Ambulatory Visit (INDEPENDENT_AMBULATORY_CARE_PROVIDER_SITE_OTHER): Payer: BC Managed Care – PPO | Admitting: Cardiology

## 2013-04-28 DIAGNOSIS — I716 Thoracoabdominal aortic aneurysm, without rupture: Secondary | ICD-10-CM

## 2013-04-30 ENCOUNTER — Encounter: Payer: Self-pay | Admitting: Family Medicine

## 2013-05-02 ENCOUNTER — Encounter: Payer: Self-pay | Admitting: *Deleted

## 2013-06-03 ENCOUNTER — Other Ambulatory Visit: Payer: Self-pay | Admitting: Family Medicine

## 2013-06-03 NOTE — Telephone Encounter (Signed)
plz phone in. 

## 2013-06-03 NOTE — Telephone Encounter (Signed)
Ok to refill 

## 2013-06-06 NOTE — Telephone Encounter (Signed)
Rx called in as directed.   

## 2013-06-27 ENCOUNTER — Other Ambulatory Visit: Payer: Self-pay | Admitting: Family Medicine

## 2013-07-08 ENCOUNTER — Encounter: Payer: Self-pay | Admitting: Family Medicine

## 2013-07-08 ENCOUNTER — Ambulatory Visit (INDEPENDENT_AMBULATORY_CARE_PROVIDER_SITE_OTHER): Payer: BC Managed Care – PPO | Admitting: Family Medicine

## 2013-07-08 VITALS — BP 124/84 | HR 94 | Temp 98.2°F | Wt 234.5 lb

## 2013-07-08 DIAGNOSIS — J209 Acute bronchitis, unspecified: Secondary | ICD-10-CM

## 2013-07-08 MED ORDER — HYDROCODONE-HOMATROPINE 5-1.5 MG/5ML PO SYRP: 5.0000 mL | ORAL_SOLUTION | Freq: Two times a day (BID) | ORAL | Status: DC | PRN

## 2013-07-08 MED ORDER — AZITHROMYCIN 250 MG PO TABS: ORAL_TABLET | ORAL | Status: DC

## 2013-07-08 NOTE — Assessment & Plan Note (Signed)
Anticipate acute bronchitis. Given lung findings will treat aggressively to cover atypicals with zpack. Treat cough with hycodan prn (discussed sedation precautions), ibuprofen and immediate release guaifenesin during the day Update if sxs persist or fail to improve.  Red flags to return discussed
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
## 2013-07-08 NOTE — Progress Notes (Signed)
Pre-visit discussion using our clinic review tool. No additional management support is needed unless otherwise documented below in the visit note.  

## 2013-07-08 NOTE — Patient Instructions (Signed)
I do think you have bronchitis - treat with zpack antibiotic. Start ibuprofen 400-600mg  twice daily with food. Cough syrup for night time . Push fluids and plenty of rest. Immediate release guaifenesin with plenty of water to help mobilize mucous out. Let me know if fever >101.05, worsening productive cough, or not improving as expected.

## 2013-07-11 ENCOUNTER — Ambulatory Visit: Payer: BC Managed Care – PPO | Admitting: Family Medicine

## 2013-07-12 ENCOUNTER — Telehealth: Payer: Self-pay | Admitting: *Deleted

## 2013-07-12 NOTE — Telephone Encounter (Signed)
Plz notify - zpack is longacting med that stays in his system for several weeks. Would give this more time.  How is cough syrup working for night time? If no improvement into end of week call us back with an update.  if any worsening fever or shortness of breath or wheezing to return for recheck.

## 2013-07-12 NOTE — Telephone Encounter (Signed)
Patient called to let you know that he finished the antibiotic today. Patient states that he still has a terrible cough, productive/yellow, no fever. Patient thinks that he needs a stronger antibiotic. Pharmacy [facility]

## 2013-07-13 NOTE — Telephone Encounter (Signed)
Patient notified. He said the cough syrup isn't really working to help him sleep and that OTC Robitussin seems to work better. He will continue to use this since you are not here today to give him anything stronger. He thinks he is better today than he was yesterday when called. He will call back on Friday if he doesn't continue to get better.

## 2013-07-20 ENCOUNTER — Ambulatory Visit: Payer: BC Managed Care – PPO | Admitting: Family Medicine

## 2013-07-25 ENCOUNTER — Encounter: Payer: Self-pay | Admitting: Family Medicine

## 2013-07-25 ENCOUNTER — Ambulatory Visit (INDEPENDENT_AMBULATORY_CARE_PROVIDER_SITE_OTHER): Payer: BC Managed Care – PPO | Admitting: Family Medicine

## 2013-07-25 VITALS — BP 118/64 | HR 92 | Temp 98.1°F | Wt 246.5 lb

## 2013-07-25 DIAGNOSIS — M899 Disorder of bone, unspecified: Secondary | ICD-10-CM

## 2013-07-25 DIAGNOSIS — I10 Essential (primary) hypertension: Secondary | ICD-10-CM

## 2013-07-25 DIAGNOSIS — M25519 Pain in unspecified shoulder: Secondary | ICD-10-CM

## 2013-07-25 DIAGNOSIS — M25511 Pain in right shoulder: Secondary | ICD-10-CM | POA: Insufficient documentation

## 2013-07-25 DIAGNOSIS — M949 Disorder of cartilage, unspecified: Secondary | ICD-10-CM

## 2013-07-25 DIAGNOSIS — I712 Thoracic aortic aneurysm, without rupture, unspecified: Secondary | ICD-10-CM

## 2013-07-25 DIAGNOSIS — M8580 Other specified disorders of bone density and structure, unspecified site: Secondary | ICD-10-CM

## 2013-07-25 LAB — BASIC METABOLIC PANEL
BUN: 14 mg/dL (ref 6–23)
CHLORIDE: 103 meq/L (ref 96–112)
CO2: 31 mEq/L (ref 19–32)
Calcium: 9.4 mg/dL (ref 8.4–10.5)
Creatinine, Ser: 1.1 mg/dL (ref 0.4–1.5)
GFR: 76 mL/min (ref >60.00)
Glucose, Bld: 105 mg/dL — ABNORMAL HIGH (ref 70–99)
Potassium: 4.6 mEq/L (ref 3.5–5.1)
Sodium: 139 mEq/L (ref 135–145)

## 2013-07-25 LAB — TSH: TSH: 1.33 u[IU]/mL (ref 0.35–5.50)

## 2013-07-25 MED ORDER — TRAMADOL HCL 50 MG PO TABS: 50.0000 mg | ORAL_TABLET | Freq: Two times a day (BID) | ORAL | Status: DC | PRN

## 2013-07-25 NOTE — Assessment & Plan Note (Signed)
Chronic, stable. Continue meds. 

## 2013-07-25 NOTE — Assessment & Plan Note (Signed)
Discussed tylenol then tramadol prn use.

## 2013-07-25 NOTE — Patient Instructions (Addendum)
Blood work today. No changes to meds today - you are doing well. Return to see me for physical in 6 months. Call your insurance about the shingles shot to see if it is covered or how much it would cost and where is cheaper (here or pharmacy).  If you want to receive here, call for nurse visit. For shoulder, start with tylenol, may use tramadol for breakthrough pain.

## 2013-07-25 NOTE — Assessment & Plan Note (Signed)
Stable echo 04/2013.  Discussed with patient dx, as well as improtance of BP control and need for urgent evaluation if any chest pain.

## 2013-07-25 NOTE — Progress Notes (Signed)
BP 118/64  Pulse 92  Temp(Src) 98.1 F (36.7 C) (Oral)  Wt 246 lb 8 oz (111.812 kg)  SpO2 97%   CC: 3 mo f/u  Subjective:    Patient ID: Gregory Fisher, male    DOB: [birth year], 63 y.o.   MRN: [id number]  HPI: Gregory Fisher is a 63 y.o. male presenting on 07/25/2013 with Follow-up  HTN - Compliant with current antihypertensive regimen of diovan hct 320/12.5mg , clonidine 0.1mg  bid, and coreg 12.5mg  bid.  Does check blood pressures at home: well controlled, no recent checkes.  No low blood pressure readings or symptoms of dizziness/syncope.  Denies HA, vision changes, CP/tightness, SOB, leg swelling.  BP Readings from Last 3 Encounters:  07/25/13 118/64  07/08/13 124/84  04/11/13 130/88   Mood - stopped amitriptyline because he's currently taking alka seltzer cold at night and didn't want to mix meds.  Bronchitis - getting over this, taking robitussin and alka seltzer plus, still with some head congestion.  H/o bilat shoulder issues, s/p L RTC repair x 2.  Having worsening R shoulder discomfort.  Worse with sleep.  Has had shots in past.  Thinks will need work done by ortho but states he cannot afford currently.  Requests tramadol prn.  Relevant past medical, surgical, family and social history reviewed and updated. Allergies and medications reviewed and updated. Current Outpatient Prescriptions on File Prior to Visit  Medication Sig  . aspirin EC 81 MG tablet Take 81 mg by mouth daily.   . Calcium Carbonate-Vitamin D (CALCIUM 600+D) 600-400 MG-UNIT per tablet Take 1 tablet by mouth daily.  . carvedilol (COREG) 12.5 MG tablet TAKE 1 TABLET (12.5 MG TOTAL) BY MOUTH 2 (TWO) TIMES DAILY WITH A MEAL.  . cholecalciferol (VITAMIN D) 1000 UNITS tablet Take 1,000 Units by mouth daily.  . cloNIDine (CATAPRES) 0.1 MG tablet Take 1 tablet (0.1 mg total) by mouth 2 (two) times daily.  Marland Kitchen LORazepam (ATIVAN) 1 MG tablet TAKE 1 TABLET BY MOUTH TWICE A DAY AS NEEDED ANXIETY  . omega-3 fish oil  (MAXEPA) 1000 MG CAPS capsule Take 1 capsule by mouth 2 (two) times daily.   . simvastatin (ZOCOR) 80 MG tablet TAKE 1 BY MOUTH AT BEDTIME  . tamsulosin (FLOMAX) 0.4 MG CAPS Take 0.4 mg by mouth at bedtime.  . valsartan-hydrochlorothiazide (DIOVAN-HCT) 320-12.5 MG per tablet TAKE 1 TABLET BY MOUTH DAILY.  . vitamin C (ASCORBIC ACID) 500 MG tablet Take 500 mg by mouth daily.  Marland Kitchen amitriptyline (ELAVIL) 50 MG tablet Take 1 tablet (50 mg total) by mouth at bedtime.  . magnesium hydroxide (MILK OF MAGNESIA) 400 MG/5ML suspension Take 5 mL by mouth daily as needed for constipation.   No current facility-administered medications on file prior to visit.    Review of Systems Per HPI unless specifically indicated above    Objective:    BP 118/64  Pulse 92  Temp(Src) 98.1 F (36.7 C) (Oral)  Wt 246 lb 8 oz (111.812 kg)  SpO2 97%  Physical Exam  Nursing note and vitals reviewed. Constitutional: He appears well-developed and well-nourished. No distress.  HENT:  Head: Normocephalic and atraumatic.  Mouth/Throat: Oropharynx is clear and moist. No oropharyngeal exudate.  Cardiovascular: Normal rate, regular rhythm, normal heart sounds and intact distal pulses.   No murmur heard. Pulmonary/Chest: Effort normal and breath sounds normal. No respiratory distress. He has no wheezes. He has no rales.  Musculoskeletal: He exhibits no edema.  Skin: Skin is warm and dry. No  rash noted.       Assessment & Plan:   Problem List Items Addressed This Visit   HTN (hypertension) - Primary     Chronic, stable. Continue meds.    Relevant Orders      TSH      Basic metabolic panel   Osteopenia     Unclear etiology - check TSH.  Continue cal/vit D once daily.  H/o hypogonadism per prior report.    Right shoulder pain     Discussed tylenol then tramadol prn use.    Thoracic aortic aneurysm     Stable echo 04/2013.  Discussed with patient dx, as well as improtance of BP control and need for urgent  evaluation if any chest pain.        Follow up plan: Return in about 6 months (around 01/22/2014), or if symptoms worsen or fail to improve, for annual exam, prior fasting for blood work.

## 2013-07-25 NOTE — Assessment & Plan Note (Signed)
Unclear etiology - check TSH.  Continue cal/vit D once daily.  H/o hypogonadism per prior report.

## 2013-07-25 NOTE — Progress Notes (Signed)
Pre-visit discussion using our clinic review tool. No additional management support is needed unless otherwise documented below in the visit note.  

## 2013-07-27 ENCOUNTER — Telehealth: Payer: Self-pay | Admitting: Family Medicine

## 2013-07-27 ENCOUNTER — Encounter: Payer: Self-pay | Admitting: *Deleted

## 2013-07-27 NOTE — Telephone Encounter (Signed)
Relevant patient education mailed to patient.  

## 2013-08-17 ENCOUNTER — Telehealth: Payer: Self-pay | Admitting: Family Medicine

## 2013-08-17 NOTE — Telephone Encounter (Signed)
Patient notified

## 2013-08-17 NOTE — Telephone Encounter (Signed)
I recommend against colon cleansers as they can be unsafe.

## 2013-08-17 NOTE — Telephone Encounter (Signed)
Caller: Gregory Fisher/Patient; Phone: [phone]; Reason for Call: Gregory Fisher is calling to see if it would be ok for him to do a 7 day detox/colon cleanse called [employer]; wants to know if he could take it and if it would affect his meds; says he bought the box at [facility] and it advised asking physician; please call back

## 2013-08-22 ENCOUNTER — Other Ambulatory Visit: Payer: Self-pay | Admitting: Family Medicine

## 2013-08-22 NOTE — Telephone Encounter (Signed)
plz phone in. 

## 2013-08-22 NOTE — Telephone Encounter (Signed)
Ok to refill 

## 2013-08-22 NOTE — Telephone Encounter (Signed)
Rx called in as directed.   

## 2013-08-25 ENCOUNTER — Other Ambulatory Visit: Payer: Self-pay | Admitting: Family Medicine

## 2013-08-25 NOTE — Telephone Encounter (Signed)
Ok to refill 

## 2013-08-26 NOTE — Telephone Encounter (Signed)
Rx called in as directed.   

## 2013-08-26 NOTE — Telephone Encounter (Signed)
plz phoen in. 

## 2013-10-11 ENCOUNTER — Other Ambulatory Visit: Payer: Self-pay | Admitting: Family Medicine

## 2013-10-11 NOTE — Telephone Encounter (Signed)
plz phone in. 

## 2013-10-11 NOTE — Telephone Encounter (Signed)
Ok to refill 

## 2013-10-12 NOTE — Telephone Encounter (Signed)
Rx called in as directed.   

## 2013-11-21 ENCOUNTER — Other Ambulatory Visit: Payer: Self-pay | Admitting: Family Medicine

## 2013-12-07 ENCOUNTER — Other Ambulatory Visit: Payer: Self-pay | Admitting: Family Medicine

## 2013-12-07 NOTE — Telephone Encounter (Signed)
Last refilled 10/11/13--last OV was 07/25/13--please advise if okay to refill

## 2013-12-07 NOTE — Telephone Encounter (Signed)
plz phone in. 

## 2013-12-08 NOTE — Telephone Encounter (Signed)
Rx called in to pharmacy. 

## 2013-12-12 ENCOUNTER — Ambulatory Visit (INDEPENDENT_AMBULATORY_CARE_PROVIDER_SITE_OTHER): Payer: BC Managed Care – PPO | Admitting: Family Medicine

## 2013-12-12 ENCOUNTER — Encounter: Payer: Self-pay | Admitting: Family Medicine

## 2013-12-12 VITALS — BP 138/96 | HR 76 | Temp 97.9°F | Wt 230.2 lb

## 2013-12-12 DIAGNOSIS — G4700 Insomnia, unspecified: Secondary | ICD-10-CM | POA: Insufficient documentation

## 2013-12-12 DIAGNOSIS — R5381 Other malaise: Secondary | ICD-10-CM | POA: Insufficient documentation

## 2013-12-12 DIAGNOSIS — I10 Essential (primary) hypertension: Secondary | ICD-10-CM

## 2013-12-12 DIAGNOSIS — R5383 Other fatigue: Secondary | ICD-10-CM

## 2013-12-12 LAB — BASIC METABOLIC PANEL
BUN: 7 mg/dL (ref 6–23)
CALCIUM: 9.4 mg/dL (ref 8.4–10.5)
CO2: 30 meq/L (ref 19–32)
Chloride: 99 mEq/L (ref 96–112)
Creatinine, Ser: 1 mg/dL (ref 0.4–1.5)
GFR: 76.75 mL/min (ref >60.00)
GLUCOSE: 108 mg/dL — AB (ref 70–99)
Potassium: 3.7 mEq/L (ref 3.5–5.1)
Sodium: 135 mEq/L (ref 135–145)

## 2013-12-12 MED ORDER — TRAZODONE HCL 50 MG PO TABS: 25.0000 mg | ORAL_TABLET | Freq: Every evening | ORAL | Status: DC | PRN

## 2013-12-12 MED ORDER — ATORVASTATIN CALCIUM 40 MG PO TABS: 40.0000 mg | ORAL_TABLET | Freq: Every day | ORAL | Status: DC

## 2013-12-12 NOTE — Progress Notes (Signed)
Pre visit review using our clinic review tool, if applicable. No additional management support is needed unless otherwise documented below in the visit note. 

## 2013-12-12 NOTE — Assessment & Plan Note (Signed)
Chronic issue. Currently on lorazepam prn sleep/anxiety. Will add trazodone 25-50mg  nightly prn insomnia.

## 2013-12-12 NOTE — Progress Notes (Signed)
BP 138/96  Pulse 76  Temp(Src) 97.9 F (36.6 C) (Oral)  Wt 230 lb 4 oz (104.441 kg)   CC: malaise   Subjective:    Patient ID: Gregory Fisher, male    DOB: 11-Jan-1951, 63 y.o.   MRN: [id number]  HPI: Gregory Fisher is a 63 y.o. male presenting on 12/12/2013 for Not feeling well   Trouble sleeping last night. Some malaise for the past month. 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  Feeling woozy in the mornings described as lightheaded, no vertigo or pre syncope. Wonders about low blood sugars. Some itching without rash.    No fevers/chills, abd pain, nausea, coughing. No headaches.  HTN - reports compliance with carvedilol 12.5mg  bid, diovan hctz 320/12.5mg  daily, and clonidine 0.1mg  bid. Also on flomax 0.4mg  daily. No HA, CP/tightness, SOB, leg swelling. Does not check bp at home.  [facility].  HLD - compliant with statin daily. Noticing some leg pains recently.  Stopped simvastatin and noticed decrease in leg pain.  Anxiety - stable on ativan.  Not eating right - skips breakfast. Eating more junk food.   Wt Readings from Last 3 Encounters:  12/12/13 230 lb 4 oz (104.441 kg)  07/25/13 246 lb 8 oz (111.812 kg)  07/08/13 234 lb 8 oz (106.369 kg)  Body mass index is 35.26 kg/(m^2).  Relevant past medical, surgical, family and social history reviewed and updated as indicated.  Allergies and medications reviewed and updated. Current Outpatient Prescriptions on File Prior to Visit  Medication Sig  . aspirin EC 81 MG tablet Take 81 mg by mouth daily.   . carvedilol (COREG) 12.5 MG tablet TAKE 1 BY MOUTH TWICE DAILY WITH A MEAL  . cholecalciferol (VITAMIN D) 1000 UNITS tablet Take 1,000 Units by mouth daily.  . cloNIDine (CATAPRES) 0.1 MG tablet Take 1 tablet (0.1 mg total) by mouth 2 (two) times daily.  Marland Kitchen LORazepam (ATIVAN) 1 MG tablet TAKE 1 TABLET BY MOUTH 2 TIMES A DAY AS NEEDED  . omega-3 fish oil (MAXEPA) 1000 MG CAPS capsule  Take 1 capsule by mouth 2 (two) times daily.   . tamsulosin (FLOMAX) 0.4 MG CAPS Take 0.4 mg by mouth at bedtime.  . traMADol (ULTRAM) 50 MG tablet TAKE 1 TABLET BY MOUTH EVERY 12 HOURS AS NEEDED FOR MODERATE PAIN  . valsartan-hydrochlorothiazide (DIOVAN-HCT) 320-12.5 MG per tablet TAKE 1 BY MOUTH DAILY  . vitamin C (ASCORBIC ACID) 500 MG tablet Take 500 mg by mouth daily.  . Calcium Carbonate-Vitamin D (CALCIUM 600+D) 600-400 MG-UNIT per tablet Take 1 tablet by mouth daily.  . magnesium hydroxide (MILK OF MAGNESIA) 400 MG/5ML suspension Take 5 mL by mouth daily as needed for constipation.  . psyllium (METAMUCIL) 58.6 % powder Take 1 packet by mouth daily.   No current facility-administered medications on file prior to visit.    Review of Systems Per HPI unless specifically indicated above    Objective:    BP 138/96  Pulse 76  Temp(Src) 97.9 F (36.6 C) (Oral)  Wt 230 lb 4 oz (104.441 kg)  Physical Exam  Vitals reviewed. Constitutional: He is oriented to person, place, and time. He appears well-developed and well-nourished. No distress.  HENT:  Head: Normocephalic and atraumatic.  Mouth/Throat: Oropharynx is clear and moist. No oropharyngeal exudate.  Eyes: Conjunctivae and EOM are normal. Pupils are equal, round, and reactive to light. No scleral icterus.  Neck: Normal range of motion. Neck  supple. Carotid bruit is not present.  Cardiovascular: Normal rate, regular rhythm, normal heart sounds and intact distal pulses.   No murmur heard. Pulmonary/Chest: Breath sounds normal. No respiratory distress. He has no wheezes. He has no rales.  Abdominal: Soft. There is no tenderness.  No abd/renal bruits  Musculoskeletal: He exhibits no edema.  Neurological: He is alert and oriented to person, place, and time. He has normal strength. No cranial nerve deficit or sensory deficit. Coordination normal.  CN 2-12 intact Intact FTN  Skin: Skin is warm and dry. No rash noted.   Results for  orders placed in visit on 07/25/13  TSH      Result Value Ref Range   TSH 1.33  0.35 - 5.50 uIU/mL  BASIC METABOLIC PANEL      Result Value Ref Range   Sodium 139  135 - 145 mEq/L   Potassium 4.6  3.5 - 5.1 mEq/L   Chloride 103  96 - 112 mEq/L   CO2 31  19 - 32 mEq/L   Glucose, Bld 105 (*) 70 - 99 mg/dL   BUN 14  6 - 23 mg/dL   Creatinine, Ser 1.1  0.4 - 1.5 mg/dL   Calcium 9.4  8.4 - 10.5 mg/dL   GFR 76.00  >60.00 mL/min      Assessment & Plan:   Problem List Items Addressed This Visit   Malaise - Primary     Nonspecific sxs with nonfocal exam. Pt questions hypoglycemia, I wonder about dehydration. Will check Cr and glu today. Continue to monitor sxs for now.    Insomnia     Chronic issue. Currently on lorazepam prn sleep/anxiety. Will add trazodone 25-50mg  nightly prn insomnia.    HTN (hypertension)      Chronic, overall stable. Continue meds.  BP Readings from Last 3 Encounters:  12/12/13 138/96  07/25/13 118/64  07/08/13 124/84      Relevant Medications      atorvastatin (LIPITOR) tablet   Other Relevant Orders      Basic metabolic panel       Follow up plan: Return if symptoms worsen or fail to improve.

## 2013-12-12 NOTE — Assessment & Plan Note (Signed)
Nonspecific sxs with nonfocal exam. Pt questions hypoglycemia, I wonder about dehydration. Will check Cr and glu today. Continue to monitor sxs for now.

## 2013-12-12 NOTE — Assessment & Plan Note (Signed)
Chronic, overall stable. Continue meds.  BP Readings from Last 3 Encounters:  12/12/13 138/96  07/25/13 118/64  07/08/13 124/84

## 2013-12-12 NOTE — Patient Instructions (Addendum)
Let's try lipitor 40mg  instead of simvastatin daily. Let's check blood work today No changes to medications today. Let me know if not feeling better.

## 2013-12-13 ENCOUNTER — Telehealth: Payer: Self-pay

## 2013-12-13 ENCOUNTER — Encounter: Payer: Self-pay | Admitting: *Deleted

## 2013-12-13 NOTE — Telephone Encounter (Signed)
Joy with [facility] left v/m requesting clarification of med; ref # [id number]; pt had received refill simvastatin in 11/2013 and now rx for atorvastatin 40 mg. Pt was seen [facility] 12/12/13 and simvastatin was stopped and pt was to start atorvastatin. [place] pharmacist with [facility] voiced understanding.

## 2014-01-12 ENCOUNTER — Telehealth: Payer: Self-pay

## 2014-01-12 NOTE — Telephone Encounter (Signed)
Pt missed call yesterday from [facility] #; pts v/m not working. Pt said probably reminder call about 01/16/14 at 8 AM lab appt. Pt will keep appt for fasting lab work.

## 2014-01-14 ENCOUNTER — Other Ambulatory Visit: Payer: Self-pay | Admitting: Family Medicine

## 2014-01-14 DIAGNOSIS — N400 Enlarged prostate without lower urinary tract symptoms: Secondary | ICD-10-CM

## 2014-01-14 DIAGNOSIS — I10 Essential (primary) hypertension: Secondary | ICD-10-CM

## 2014-01-14 DIAGNOSIS — E785 Hyperlipidemia, unspecified: Secondary | ICD-10-CM

## 2014-01-14 DIAGNOSIS — E559 Vitamin D deficiency, unspecified: Secondary | ICD-10-CM

## 2014-01-16 ENCOUNTER — Other Ambulatory Visit (INDEPENDENT_AMBULATORY_CARE_PROVIDER_SITE_OTHER): Payer: BC Managed Care – PPO

## 2014-01-16 ENCOUNTER — Encounter: Payer: Self-pay | Admitting: Radiology

## 2014-01-16 DIAGNOSIS — E559 Vitamin D deficiency, unspecified: Secondary | ICD-10-CM

## 2014-01-16 DIAGNOSIS — M899 Disorder of bone, unspecified: Secondary | ICD-10-CM

## 2014-01-16 DIAGNOSIS — N400 Enlarged prostate without lower urinary tract symptoms: Secondary | ICD-10-CM

## 2014-01-16 DIAGNOSIS — M8580 Other specified disorders of bone density and structure, unspecified site: Secondary | ICD-10-CM

## 2014-01-16 DIAGNOSIS — M949 Disorder of cartilage, unspecified: Secondary | ICD-10-CM

## 2014-01-16 DIAGNOSIS — E785 Hyperlipidemia, unspecified: Secondary | ICD-10-CM

## 2014-01-16 LAB — LIPID PANEL
CHOL/HDL RATIO: 5
Cholesterol: 185 mg/dL (ref 0–200)
HDL: 40.8 mg/dL (ref >39.00)
LDL Cholesterol: 118 mg/dL — ABNORMAL HIGH (ref 0–99)
NONHDL: 144.2
Triglycerides: 133 mg/dL (ref 0.0–149.0)
VLDL: 26.6 mg/dL (ref 0.0–40.0)

## 2014-01-16 LAB — VITAMIN D 25 HYDROXY (VIT D DEFICIENCY, FRACTURES): VITD: 36.97 ng/mL (ref 30.00–100.00)

## 2014-01-16 LAB — PSA: PSA: 2.89 ng/mL (ref 0.10–4.00)

## 2014-01-23 ENCOUNTER — Ambulatory Visit (INDEPENDENT_AMBULATORY_CARE_PROVIDER_SITE_OTHER): Payer: BC Managed Care – PPO | Admitting: Family Medicine

## 2014-01-23 ENCOUNTER — Encounter: Payer: Self-pay | Admitting: Family Medicine

## 2014-01-23 VITALS — BP 110/70 | HR 72 | Temp 98.1°F | Ht 67.75 in | Wt 221.5 lb

## 2014-01-23 DIAGNOSIS — E559 Vitamin D deficiency, unspecified: Secondary | ICD-10-CM

## 2014-01-23 DIAGNOSIS — I10 Essential (primary) hypertension: Secondary | ICD-10-CM

## 2014-01-23 DIAGNOSIS — I6529 Occlusion and stenosis of unspecified carotid artery: Secondary | ICD-10-CM

## 2014-01-23 DIAGNOSIS — M545 Low back pain, unspecified: Secondary | ICD-10-CM

## 2014-01-23 DIAGNOSIS — E785 Hyperlipidemia, unspecified: Secondary | ICD-10-CM

## 2014-01-23 DIAGNOSIS — N400 Enlarged prostate without lower urinary tract symptoms: Secondary | ICD-10-CM

## 2014-01-23 DIAGNOSIS — Z0000 Encounter for general adult medical examination without abnormal findings: Secondary | ICD-10-CM

## 2014-01-23 DIAGNOSIS — Z1211 Encounter for screening for malignant neoplasm of colon: Secondary | ICD-10-CM

## 2014-01-23 LAB — POCT URINALYSIS DIPSTICK
Bilirubin, UA: NEGATIVE
Glucose, UA: NEGATIVE
KETONES UA: NEGATIVE
Leukocytes, UA: NEGATIVE
Nitrite, UA: NEGATIVE
PH UA: 6
Protein, UA: NEGATIVE
RBC UA: NEGATIVE
SPEC GRAV UA: 1.02
Urobilinogen, UA: 0.2

## 2014-01-23 MED ORDER — ATORVASTATIN CALCIUM 80 MG PO TABS: 80.0000 mg | ORAL_TABLET | Freq: Every day | ORAL | Status: DC

## 2014-01-23 NOTE — Addendum Note (Signed)
Addended by: Royann Shivers A on: 01/23/2014 12:09 PM   Modules accepted: Orders

## 2014-01-23 NOTE — Assessment & Plan Note (Signed)
Reviewed carotid US from 01/2013. Recommended rpt Q2 yrs. No bruits.

## 2014-01-23 NOTE — Patient Instructions (Addendum)
Stool kit today. Urine check today (urinalysis for infection). Increase lipitor to $RemoveBe'80mg'heshzkDjY$  daily (double up until you run out then new dose will be $Remov'80mg'cMAZis$  at night time). Call your insurance about the shingles shot to see if it is covered or how much it would cost and where is cheaper (here or pharmacy).  If you want to receive here, call for nurse visit. Return for fasting labwork in 6 months to recheck prostate and cholesterol. Return in 1 year for next physical. Good to see you today, call us with questions.

## 2014-01-23 NOTE — Progress Notes (Signed)
Pre visit review using our clinic review tool, if applicable. No additional management support is needed unless otherwise documented below in the visit note. 

## 2014-01-23 NOTE — Assessment & Plan Note (Signed)
Preventative protocols reviewed and updated unless pt declined. Discussed healthy diet and lifestyle.  

## 2014-01-23 NOTE — Assessment & Plan Note (Signed)
Chronic, continue med regimen.

## 2014-01-23 NOTE — Assessment & Plan Note (Signed)
Increase lipitor to 80mg  daily given not controlled with current regimen.
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
## 2014-01-23 NOTE — Assessment & Plan Note (Signed)
Now in range. Continue regimne.

## 2014-01-23 NOTE — Assessment & Plan Note (Addendum)
Noticing increasing lower urinary tract symptoms. Prostate exam with mild enlargement today. Will check UA to r/o infection as endorses some LBP although exam without significant tenderness or bogginess of prostate.

## 2014-01-26 ENCOUNTER — Other Ambulatory Visit: Payer: BC Managed Care – PPO

## 2014-01-26 ENCOUNTER — Encounter: Payer: Self-pay | Admitting: *Deleted

## 2014-01-26 DIAGNOSIS — Z1211 Encounter for screening for malignant neoplasm of colon: Secondary | ICD-10-CM

## 2014-01-26 LAB — FECAL OCCULT BLOOD, IMMUNOCHEMICAL: Fecal Occult Bld: NEGATIVE

## 2014-01-26 LAB — FECAL OCCULT BLOOD, GUAIAC: Fecal Occult Blood: NEGATIVE

## 2014-02-01 ENCOUNTER — Encounter: Payer: Self-pay | Admitting: Family Medicine

## 2014-02-07 ENCOUNTER — Other Ambulatory Visit: Payer: Self-pay | Admitting: Family Medicine

## 2014-02-07 NOTE — Telephone Encounter (Signed)
plz phone in. 

## 2014-02-07 NOTE — Telephone Encounter (Signed)
Rx called in as directed.   

## 2014-02-07 NOTE — Telephone Encounter (Signed)
Ok to refill 

## 2014-02-14 ENCOUNTER — Other Ambulatory Visit: Payer: Self-pay | Admitting: Family Medicine

## 2014-02-15 ENCOUNTER — Telehealth: Payer: Self-pay

## 2014-02-15 NOTE — Telephone Encounter (Signed)
Pt had lab test with [facility] and pt has received ck from [employer] and wants to know what to do with ck; advised pt to call Landry Mellow [phone]. Pt voiced understanding.

## 2014-03-08 ENCOUNTER — Other Ambulatory Visit: Payer: Self-pay | Admitting: Family Medicine

## 2014-03-08 MED ORDER — ATORVASTATIN CALCIUM 80 MG PO TABS: 80.0000 mg | ORAL_TABLET | Freq: Every day | ORAL | Status: DC

## 2014-03-08 NOTE — Telephone Encounter (Signed)
plz phone in. 

## 2014-03-09 ENCOUNTER — Other Ambulatory Visit: Payer: Self-pay | Admitting: Family Medicine

## 2014-03-09 NOTE — Telephone Encounter (Signed)
plz phone in. 

## 2014-03-09 NOTE — Telephone Encounter (Signed)
Rx's already called to pharmacy.

## 2014-03-09 NOTE — Telephone Encounter (Signed)
Electronic refill request, please advise  

## 2014-03-09 NOTE — Telephone Encounter (Signed)
Rx's called to pharmacy as instructed. 

## 2014-04-17 ENCOUNTER — Other Ambulatory Visit: Payer: Self-pay | Admitting: Family Medicine

## 2014-04-17 NOTE — Telephone Encounter (Signed)
Ok to refill 

## 2014-04-17 NOTE — Telephone Encounter (Signed)
Rx's called in as directed.  

## 2014-04-17 NOTE — Telephone Encounter (Signed)
plz phone in. 

## 2014-05-09 ENCOUNTER — Encounter: Payer: Self-pay | Admitting: Family Medicine

## 2014-05-09 ENCOUNTER — Ambulatory Visit (INDEPENDENT_AMBULATORY_CARE_PROVIDER_SITE_OTHER): Payer: BC Managed Care – PPO | Admitting: Family Medicine

## 2014-05-09 VITALS — BP 118/78 | HR 68 | Temp 97.9°F | Wt 222.0 lb

## 2014-05-09 DIAGNOSIS — R739 Hyperglycemia, unspecified: Secondary | ICD-10-CM

## 2014-05-09 DIAGNOSIS — E1169 Type 2 diabetes mellitus with other specified complication: Secondary | ICD-10-CM | POA: Insufficient documentation

## 2014-05-09 DIAGNOSIS — E118 Type 2 diabetes mellitus with unspecified complications: Secondary | ICD-10-CM | POA: Insufficient documentation

## 2014-05-09 DIAGNOSIS — E785 Hyperlipidemia, unspecified: Secondary | ICD-10-CM

## 2014-05-09 DIAGNOSIS — R7303 Prediabetes: Secondary | ICD-10-CM | POA: Insufficient documentation

## 2014-05-09 DIAGNOSIS — R5383 Other fatigue: Secondary | ICD-10-CM

## 2014-05-09 DIAGNOSIS — Z23 Encounter for immunization: Secondary | ICD-10-CM

## 2014-05-09 DIAGNOSIS — I10 Essential (primary) hypertension: Secondary | ICD-10-CM

## 2014-05-09 LAB — BASIC METABOLIC PANEL
BUN: 16 mg/dL (ref 6–23)
CALCIUM: 9.5 mg/dL (ref 8.4–10.5)
CHLORIDE: 106 meq/L (ref 96–112)
CO2: 25 mEq/L (ref 19–32)
CREATININE: 1 mg/dL (ref 0.4–1.5)
GFR: 77.51 mL/min (ref >60.00)
GLUCOSE: 99 mg/dL (ref 70–99)
Potassium: 4.7 mEq/L (ref 3.5–5.1)
Sodium: 141 mEq/L (ref 135–145)

## 2014-05-09 LAB — CBC WITH DIFFERENTIAL/PLATELET
BASOS ABS: 0 10*3/uL (ref 0.0–0.1)
Basophils Relative: 0.5 % (ref 0.0–3.0)
EOS ABS: 0.1 10*3/uL (ref 0.0–0.7)
Eosinophils Relative: 2.4 % (ref 0.0–5.0)
HCT: 44 % (ref 39.0–52.0)
Hemoglobin: 14.7 g/dL (ref 13.0–17.0)
Lymphocytes Relative: 20.5 % (ref 12.0–46.0)
Lymphs Abs: 1.2 10*3/uL (ref 0.7–4.0)
MCHC: 33.4 g/dL (ref 30.0–36.0)
MCV: 94.8 fl (ref 78.0–100.0)
MONO ABS: 0.5 10*3/uL (ref 0.1–1.0)
Monocytes Relative: 8.3 % (ref 3.0–12.0)
NEUTROS PCT: 68.3 % (ref 43.0–77.0)
Neutro Abs: 4.1 10*3/uL (ref 1.4–7.7)
Platelets: 203 10*3/uL (ref 150.0–400.0)
RBC: 4.64 Mil/uL (ref 4.22–5.81)
RDW: 12.7 % (ref 11.5–15.5)
WBC: 5.9 10*3/uL (ref 4.0–10.5)

## 2014-05-09 LAB — HEMOGLOBIN A1C: Hgb A1c MFr Bld: 5.4 % (ref 4.6–6.5)

## 2014-05-09 LAB — POCT CBG (FASTING - GLUCOSE)-MANUAL ENTRY: GLUCOSE FASTING, POC: 107 mg/dL — AB (ref 70–99)

## 2014-05-09 LAB — LDL CHOLESTEROL, DIRECT: Direct LDL: 69.8 mg/dL

## 2014-05-09 LAB — FOLATE: Folate: 23.4 ng/mL (ref >5.9)

## 2014-05-09 LAB — VITAMIN B12: VITAMIN B 12: 555 pg/mL (ref 211–911)

## 2014-05-09 MED ORDER — TRAMADOL HCL 50 MG PO TABS: ORAL_TABLET | ORAL | Status: DC

## 2014-05-09 MED ORDER — LORAZEPAM 1 MG PO TABS: ORAL_TABLET | ORAL | Status: DC

## 2014-05-09 NOTE — Assessment & Plan Note (Signed)
Check for reversible causes of fatigue including b12, CBC.

## 2014-05-09 NOTE — Assessment & Plan Note (Signed)
Chronic, stable. Continue regimen. 

## 2014-05-09 NOTE — Patient Instructions (Addendum)
Flu shot today cbg today Blood work today. Good to see you today, call us with questions.

## 2014-05-09 NOTE — Assessment & Plan Note (Signed)
Check A1c, cbg today. Doubt diabetes but will check for this.

## 2014-05-09 NOTE — Progress Notes (Signed)
BP 118/78 mmHg  Pulse 68  Temp(Src) 97.9 F (36.6 C) (Oral)  Wt 222 lb (100.699 kg)   CC: discuss concerns  Subjective:    Patient ID: Gregory Fisher, male    DOB: December 21, 1950, 63 y.o.   MRN: [id number]  HPI: Gregory Fisher is a 63 y.o. male presenting on 05/09/2014 for Diabetes   Noticing increasing fatigue and pain in legs ongoing for last few months.  Also feels he has noticed increased voiding. Worried about diabetes. For this reason has decreased soft drink intake. No dysuria, urgency, hematuria, fevers/chills. No results found for: HGBA1C  Lipitor increased to 80mg  daily 01/2014. Not currently taking aspirin. Advised to refill it  Fasting today.  No results found for: VITAMINB12   Relevant past medical, surgical, family and social history reviewed and updated as indicated. Interim medical history since our last visit reviewed. Allergies and medications reviewed and updated.  Current Outpatient Prescriptions on File Prior to Visit  Medication Sig  . aspirin EC 81 MG tablet Take 81 mg by mouth daily.   Marland Kitchen atorvastatin (LIPITOR) 80 MG tablet Take 1 tablet (80 mg total) by mouth daily.  . Calcium Carbonate-Vitamin D (CALCIUM 600+D) 600-400 MG-UNIT per tablet Take 1 tablet by mouth daily.  . carvedilol (COREG) 12.5 MG tablet TAKE 1 BY MOUTH TWICE DAILY WITH A MEAL  . cholecalciferol (VITAMIN D) 1000 UNITS tablet Take 1,000 Units by mouth daily.  . cloNIDine (CATAPRES) 0.1 MG tablet TAKE 1 TABLET (0.1 MG TOTAL) BY MOUTH 2 (TWO) TIMES DAILY.  . magnesium hydroxide (MILK OF MAGNESIA) 400 MG/5ML suspension Take 5 mL by mouth daily as needed for constipation.  . psyllium (METAMUCIL) 58.6 % powder Take 1 packet by mouth daily as needed.   . tamsulosin (FLOMAX) 0.4 MG CAPS Take 0.4 mg by mouth at bedtime.  . traZODone (DESYREL) 50 MG tablet Take 0.5-1 tablets (25-50 mg total) by mouth at bedtime as needed for sleep.  . valsartan-hydrochlorothiazide (DIOVAN-HCT) 320-12.5 MG per  tablet TAKE 1 BY MOUTH DAILY  . vitamin B-12 (CYANOCOBALAMIN) 1000 MCG tablet Take 1,000 mcg by mouth daily.  . vitamin C (ASCORBIC ACID) 500 MG tablet Take 500 mg by mouth daily.  . Omega-3 Krill Oil 500 MG CAPS Take 500 mg by mouth 2 (two) times daily.   No current facility-administered medications on file prior to visit.    Review of Systems Per HPI unless specifically indicated above     Objective:    BP 118/78 mmHg  Pulse 68  Temp(Src) 97.9 F (36.6 C) (Oral)  Wt 222 lb (100.699 kg)  Wt Readings from Last 3 Encounters:  05/09/14 222 lb (100.699 kg)  01/23/14 221 lb 8 oz (100.472 kg)  12/12/13 230 lb 4 oz (104.441 kg)    Physical Exam  Constitutional: He appears well-developed and well-nourished. No distress.  HENT:  Mouth/Throat: Oropharynx is clear and moist. No oropharyngeal exudate.  Cardiovascular: Normal rate, regular rhythm, normal heart sounds and intact distal pulses.   No murmur heard. Somewhat distant heart sounds  Pulmonary/Chest: Effort normal and breath sounds normal. No respiratory distress. He has no wheezes. He has no rales.  Musculoskeletal: He exhibits no edema.  Skin: Skin is warm and dry. No rash noted.  Nursing note and vitals reviewed.  Results for orders placed or performed in visit on 01/26/14  Fecal Occult Blood, Guaiac  Result Value Ref Range   Fecal Occult Blood Negative       Assessment &  Plan:   Problem List Items Addressed This Visit    Other fatigue - Primary    Check for reversible causes of fatigue including b12, CBC.    Relevant Orders      Vitamin B12      Folate      CBC with Differential   Hyperglycemia    Check A1c, cbg today. Doubt diabetes but will check for this.    Relevant Orders      Hemoglobin A1c      Basic metabolic panel   HTN (hypertension)    Chronic, stable. Continue regimen.    HLD (hyperlipidemia)    Check dLDL after increased lipitor dose today.    Relevant Orders      LDL Cholesterol, Direct         Follow up plan: Return in about 3 months (around 08/08/2014), or as needed, for follow up visit.

## 2014-05-09 NOTE — Assessment & Plan Note (Signed)
Check dLDL after increased lipitor dose today.

## 2014-05-09 NOTE — Addendum Note (Signed)
Addended by: Emelia Salisbury C on: 05/09/2014 10:22 AM   Modules accepted: Orders

## 2014-05-09 NOTE — Progress Notes (Signed)
Pre visit review using our clinic review tool, if applicable. No additional management support is needed unless otherwise documented below in the visit note. 

## 2014-05-11 ENCOUNTER — Encounter: Payer: Self-pay | Admitting: *Deleted

## 2014-05-15 ENCOUNTER — Other Ambulatory Visit: Payer: Self-pay | Admitting: Family Medicine

## 2014-05-15 NOTE — Telephone Encounter (Signed)
Ok to refill 

## 2014-05-16 NOTE — Telephone Encounter (Signed)
This is blood pressure med. Ok to refill.

## 2014-06-12 ENCOUNTER — Other Ambulatory Visit: Payer: Self-pay | Admitting: Family Medicine

## 2014-06-12 NOTE — Telephone Encounter (Signed)
Ok to refill 

## 2014-06-14 NOTE — Telephone Encounter (Signed)
plz phone in. 

## 2014-06-14 NOTE — Telephone Encounter (Signed)
Rx's called in as directed.  

## 2014-06-26 ENCOUNTER — Other Ambulatory Visit: Payer: Self-pay | Admitting: Family Medicine

## 2014-07-26 ENCOUNTER — Other Ambulatory Visit: Payer: Self-pay | Admitting: Family Medicine

## 2014-07-26 ENCOUNTER — Other Ambulatory Visit: Payer: Self-pay

## 2014-07-26 ENCOUNTER — Other Ambulatory Visit (INDEPENDENT_AMBULATORY_CARE_PROVIDER_SITE_OTHER): Payer: BLUE CROSS/BLUE SHIELD

## 2014-07-26 DIAGNOSIS — E785 Hyperlipidemia, unspecified: Secondary | ICD-10-CM

## 2014-07-26 DIAGNOSIS — I10 Essential (primary) hypertension: Secondary | ICD-10-CM

## 2014-07-26 LAB — HEPATIC FUNCTION PANEL
ALT: 18 U/L (ref 0–53)
AST: 17 U/L (ref 0–37)
Albumin: 4 g/dL (ref 3.5–5.2)
Alkaline Phosphatase: 71 U/L (ref 39–117)
Bilirubin, Direct: 0.1 mg/dL (ref 0.0–0.3)
TOTAL PROTEIN: 6.8 g/dL (ref 6.0–8.3)
Total Bilirubin: 0.6 mg/dL (ref 0.2–1.2)

## 2014-07-26 LAB — MICROALBUMIN / CREATININE URINE RATIO
Creatinine,U: 166.5 mg/dL
Microalb Creat Ratio: 0.5 mg/g (ref 0.0–30.0)
Microalb, Ur: 0.9 mg/dL (ref 0.0–1.9)

## 2014-07-26 MED ORDER — TRAMADOL HCL 50 MG PO TABS: ORAL_TABLET | ORAL | Status: DC

## 2014-07-26 MED ORDER — LORAZEPAM 1 MG PO TABS: ORAL_TABLET | ORAL | Status: DC

## 2014-07-26 NOTE — Telephone Encounter (Signed)
Rx's called in as directed.  

## 2014-07-26 NOTE — Telephone Encounter (Signed)
plz phone in. 

## 2014-07-26 NOTE — Telephone Encounter (Signed)
Pt left v/m requesting refill lorazepam and tramadol to [facility].Please advise. Pt last seen 05/09/2014.

## 2014-07-27 ENCOUNTER — Encounter: Payer: Self-pay | Admitting: *Deleted

## 2014-07-31 ENCOUNTER — Telehealth: Payer: Self-pay

## 2014-07-31 NOTE — Telephone Encounter (Signed)
Pt request appt with Dr Danise Mina on 08/01/14; appt given; pt having itching on and off all over his body; pt states has weight gain but not sure how much he has gained and over what period of time. Pt scheduled to see Dr Darnell Level on 08/01/14 at 2:45 pm.

## 2014-08-01 ENCOUNTER — Ambulatory Visit (INDEPENDENT_AMBULATORY_CARE_PROVIDER_SITE_OTHER): Payer: BLUE CROSS/BLUE SHIELD | Admitting: Family Medicine

## 2014-08-01 ENCOUNTER — Encounter: Payer: Self-pay | Admitting: Family Medicine

## 2014-08-01 VITALS — BP 150/80 | HR 72 | Temp 97.7°F | Wt 229.5 lb

## 2014-08-01 DIAGNOSIS — L299 Pruritus, unspecified: Secondary | ICD-10-CM | POA: Insufficient documentation

## 2014-08-01 DIAGNOSIS — E669 Obesity, unspecified: Secondary | ICD-10-CM

## 2014-08-01 MED ORDER — TAMSULOSIN HCL 0.4 MG PO CAPS: 0.4000 mg | ORAL_CAPSULE | Freq: Every day | ORAL | Status: DC

## 2014-08-01 NOTE — Progress Notes (Signed)
BP 150/80 mmHg  Pulse 72  Temp(Src) 97.7 F (36.5 C) (Oral)  Wt 229 lb 8 oz (104.101 kg)   CC: discuss pruritis, weight gain Subjective:    Patient ID: Gregory Fisher, male    DOB: [birth year], 64 y.o.   MRN: [id number]  HPI: Gregory Fisher is a 64 y.o. male presenting on 08/01/2014 for Pruritis and Weight Gain   In good spirits today  Stays worried about sugars.   Worried about 7lb weight gain - over holiday season has been eating more. Not as active as he should be.   Pruritis throughout arms and legs. Worse this winter. Quit drinking >10 yrs ago.   Noticing some leg pain and cramping more noticeable at night time. No pain with exercise or exertion.   bp elevated today - ate 3 hot dogs last night.  Lab Results  Component Value Date   ALT 18 07/26/2014   AST 17 07/26/2014   ALKPHOS 71 07/26/2014   BILITOT 0.6 07/26/2014    Relevant past medical, surgical, family and social history reviewed and updated as indicated. Interim medical history since our last visit reviewed. Allergies and medications reviewed and updated. Current Outpatient Prescriptions on File Prior to Visit  Medication Sig  . aspirin EC 81 MG tablet Take 81 mg by mouth daily.   Marland Kitchen atorvastatin (LIPITOR) 80 MG tablet Take 1 tablet (80 mg total) by mouth daily.  . Calcium Carbonate-Vitamin D (CALCIUM 600+D) 600-400 MG-UNIT per tablet Take 1 tablet by mouth daily.  . carvedilol (COREG) 12.5 MG tablet TAKE 1 BY MOUTH TWICE DAILY WITH A MEAL  . cholecalciferol (VITAMIN D) 1000 UNITS tablet Take 1,000 Units by mouth daily.  . cloNIDine (CATAPRES) 0.1 MG tablet TAKE 1 TABLET (0.1 MG TOTAL) BY MOUTH 2 (TWO) TIMES DAILY.  Marland Kitchen LORazepam (ATIVAN) 1 MG tablet TAKE 1 TABLET BY MOUTH 2 TIMES A DAY AS NEEDED  . psyllium (METAMUCIL) 58.6 % powder Take 1 packet by mouth daily as needed.   . traMADol (ULTRAM) 50 MG tablet TAKE 1 TABLET BY MOUTH 2 TIMES A DAY AS NEEDED  . valsartan-hydrochlorothiazide (DIOVAN-HCT) 320-12.5  MG per tablet TAKE 1 TABLET BY MOUTH DAILY.  . vitamin C (ASCORBIC ACID) 500 MG tablet Take 500 mg by mouth daily.  . magnesium hydroxide (MILK OF MAGNESIA) 400 MG/5ML suspension Take 5 mL by mouth daily as needed for constipation.  . Omega-3 Krill Oil 500 MG CAPS Take 500 mg by mouth 2 (two) times daily.  . vitamin B-12 (CYANOCOBALAMIN) 1000 MCG tablet Take 1,000 mcg by mouth daily.   No current facility-administered medications on file prior to visit.    Review of Systems Per HPI unless specifically indicated above     Objective:    BP 150/80 mmHg  Pulse 72  Temp(Src) 97.7 F (36.5 C) (Oral)  Wt 229 lb 8 oz (104.101 kg)  Wt Readings from Last 3 Encounters:  08/01/14 229 lb 8 oz (104.101 kg)  05/09/14 222 lb (100.699 kg)  01/23/14 221 lb 8 oz (100.472 kg)   Body mass index is 35.15 kg/(m^2).  Physical Exam  Constitutional: He appears well-developed and well-nourished. No distress.  HENT:  Mouth/Throat: Oropharynx is clear and moist. No oropharyngeal exudate.  Cardiovascular: Normal rate, regular rhythm, normal heart sounds and intact distal pulses.   No murmur heard. Pulmonary/Chest: Effort normal and breath sounds normal. No respiratory distress. He has no wheezes. He has no rales.  Musculoskeletal: He exhibits no edema.  Skin: Skin is warm and dry. No rash noted.  No significant dry skin  Nursing note and vitals reviewed.     Assessment & Plan:   Problem List Items Addressed This Visit    Pruritic condition - Primary    Anticipate some degree of xerosis - treat with benadryl and moisturizer. Update if not better with this.      Obesity    Reviewed weight gain with patient, discussed slowly re-incorporating walking into routine Discussed goal amt weekly activity recommended.          Follow up plan: Return in about 3 months (around 10/30/2014) for follow up visit.

## 2014-08-01 NOTE — Assessment & Plan Note (Signed)
Anticipate some degree of xerosis - treat with benadryl and moisturizer. Update if not better with this.

## 2014-08-01 NOTE — Patient Instructions (Addendum)
Start CoQ10 supplement daily to see if cramping improves.  Start walking daily. Start slowly and build up to a goal of 145min of moderate intensity aerobic exercise weekly.  For itching - try benadryl and regular moisturizing. This may be from dry winter skin. Let us know if any questions. Your sugar was good when we checked it. Return in May for follow up

## 2014-08-01 NOTE — Progress Notes (Signed)
Pre visit review using our clinic review tool, if applicable. No additional management support is needed unless otherwise documented below in the visit note. 

## 2014-08-01 NOTE — Assessment & Plan Note (Signed)
Reviewed weight gain with patient, discussed slowly re-incorporating walking into routine Discussed goal amt weekly activity recommended.

## 2014-08-31 ENCOUNTER — Other Ambulatory Visit: Payer: Self-pay | Admitting: Family Medicine

## 2014-08-31 NOTE — Telephone Encounter (Signed)
Rx called in as directed.   

## 2014-08-31 NOTE — Telephone Encounter (Signed)
plz phone in. 

## 2014-08-31 NOTE — Telephone Encounter (Signed)
Ok to refill 

## 2014-09-12 ENCOUNTER — Telehealth: Payer: Self-pay | Admitting: *Deleted

## 2014-09-12 DIAGNOSIS — N400 Enlarged prostate without lower urinary tract symptoms: Secondary | ICD-10-CM

## 2014-09-12 NOTE — Telephone Encounter (Signed)
Patient called stating that he needs a referral to Dr. Nesi/urologist. Patient stated that he has had problems with his prostate for years and has seen him previously, but it has been about 3 years. Patient stated that he thinks that his insurance requires a referral which is BCBS and medicare disability. Patient would like a call back regarding the referral.

## 2014-09-12 NOTE — Telephone Encounter (Signed)
H/o BPH - s/p thermal ablation by Dr. Janice Norrie. Hasn't f/u recently - financial concerns. On flomax.  Referral placed.

## 2014-09-13 NOTE — Telephone Encounter (Signed)
Message left advising patient.  

## 2014-09-15 NOTE — Telephone Encounter (Signed)
Pt aware of scheduled appt on 10/09/14 w/ Dr. Janice Norrie

## 2014-09-28 ENCOUNTER — Other Ambulatory Visit: Payer: Self-pay | Admitting: Family Medicine

## 2014-09-28 NOTE — Telephone Encounter (Signed)
Rx called in as directed.   

## 2014-09-28 NOTE — Telephone Encounter (Signed)
Ok to refill 

## 2014-09-28 NOTE — Telephone Encounter (Signed)
plz phone in. 

## 2014-10-08 DIAGNOSIS — S68119A Complete traumatic metacarpophalangeal amputation of unspecified finger, initial encounter: Secondary | ICD-10-CM

## 2014-10-08 HISTORY — PX: FINGER FRACTURE SURGERY: SHX638

## 2014-10-08 HISTORY — DX: Complete traumatic metacarpophalangeal amputation of unspecified finger, initial encounter: S68.119A

## 2014-10-12 ENCOUNTER — Emergency Department (HOSPITAL_COMMUNITY): Payer: Medicare Other | Admitting: Anesthesiology

## 2014-10-12 ENCOUNTER — Ambulatory Visit (HOSPITAL_COMMUNITY)
Admission: EM | Admit: 2014-10-12 | Discharge: 2014-10-13 | Disposition: A | Discharge status: Home / Self Care | Payer: Medicare Other | Attending: Orthopedic Surgery | Admitting: Orthopedic Surgery

## 2014-10-12 ENCOUNTER — Emergency Department (HOSPITAL_COMMUNITY): Payer: Medicare Other

## 2014-10-12 ENCOUNTER — Other Ambulatory Visit (HOSPITAL_COMMUNITY): Payer: Self-pay

## 2014-10-12 ENCOUNTER — Encounter (HOSPITAL_COMMUNITY)
Admission: EM | Disposition: A | Discharge status: Home / Self Care | Payer: Self-pay | Source: Home / Self Care | Attending: Emergency Medicine

## 2014-10-12 ENCOUNTER — Encounter (HOSPITAL_COMMUNITY): Payer: Self-pay | Admitting: Emergency Medicine

## 2014-10-12 DIAGNOSIS — E559 Vitamin D deficiency, unspecified: Secondary | ICD-10-CM | POA: Diagnosis not present

## 2014-10-12 DIAGNOSIS — M8580 Other specified disorders of bone density and structure, unspecified site: Secondary | ICD-10-CM | POA: Insufficient documentation

## 2014-10-12 DIAGNOSIS — Z981 Arthrodesis status: Secondary | ICD-10-CM | POA: Diagnosis not present

## 2014-10-12 DIAGNOSIS — Z01811 Encounter for preprocedural respiratory examination: Secondary | ICD-10-CM | POA: Diagnosis present

## 2014-10-12 DIAGNOSIS — Z7982 Long term (current) use of aspirin: Secondary | ICD-10-CM | POA: Insufficient documentation

## 2014-10-12 DIAGNOSIS — S62604B Fracture of unspecified phalanx of right ring finger, initial encounter for open fracture: Secondary | ICD-10-CM | POA: Diagnosis present

## 2014-10-12 DIAGNOSIS — I712 Thoracic aortic aneurysm, without rupture: Secondary | ICD-10-CM | POA: Insufficient documentation

## 2014-10-12 DIAGNOSIS — M1990 Unspecified osteoarthritis, unspecified site: Secondary | ICD-10-CM | POA: Insufficient documentation

## 2014-10-12 DIAGNOSIS — E785 Hyperlipidemia, unspecified: Secondary | ICD-10-CM | POA: Diagnosis not present

## 2014-10-12 DIAGNOSIS — IMO0001 Reserved for inherently not codable concepts without codable children: Secondary | ICD-10-CM

## 2014-10-12 DIAGNOSIS — S61309A Unspecified open wound of unspecified finger with damage to nail, initial encounter: Secondary | ICD-10-CM

## 2014-10-12 DIAGNOSIS — E118 Type 2 diabetes mellitus with unspecified complications: Secondary | ICD-10-CM | POA: Diagnosis present

## 2014-10-12 DIAGNOSIS — N400 Enlarged prostate without lower urinary tract symptoms: Secondary | ICD-10-CM | POA: Diagnosis not present

## 2014-10-12 DIAGNOSIS — R739 Hyperglycemia, unspecified: Secondary | ICD-10-CM | POA: Diagnosis not present

## 2014-10-12 DIAGNOSIS — W3189XA Contact with other specified machinery, initial encounter: Secondary | ICD-10-CM | POA: Diagnosis not present

## 2014-10-12 DIAGNOSIS — I10 Essential (primary) hypertension: Secondary | ICD-10-CM

## 2014-10-12 DIAGNOSIS — R7303 Prediabetes: Secondary | ICD-10-CM | POA: Diagnosis present

## 2014-10-12 DIAGNOSIS — E1169 Type 2 diabetes mellitus with other specified complication: Secondary | ICD-10-CM | POA: Diagnosis present

## 2014-10-12 DIAGNOSIS — S62634B Displaced fracture of distal phalanx of right ring finger, initial encounter for open fracture: Secondary | ICD-10-CM | POA: Insufficient documentation

## 2014-10-12 DIAGNOSIS — I6529 Occlusion and stenosis of unspecified carotid artery: Secondary | ICD-10-CM | POA: Diagnosis not present

## 2014-10-12 DIAGNOSIS — S61209A Unspecified open wound of unspecified finger without damage to nail, initial encounter: Secondary | ICD-10-CM

## 2014-10-12 HISTORY — PX: AMPUTATION: SHX166

## 2014-10-12 LAB — CBC WITH DIFFERENTIAL/PLATELET
Basophils Absolute: 0 10*3/uL (ref 0.0–0.1)
Basophils Relative: 1 % (ref 0–1)
Eosinophils Absolute: 0.2 10*3/uL (ref 0.0–0.7)
Eosinophils Relative: 3 % (ref 0–5)
HCT: 40.8 % (ref 39.0–52.0)
Hemoglobin: 14.3 g/dL (ref 13.0–17.0)
LYMPHS ABS: 1.2 10*3/uL (ref 0.7–4.0)
Lymphocytes Relative: 19 % (ref 12–46)
MCH: 31.6 pg (ref 26.0–34.0)
MCHC: 35 g/dL (ref 30.0–36.0)
MCV: 90.1 fL (ref 78.0–100.0)
MONO ABS: 0.5 10*3/uL (ref 0.1–1.0)
Monocytes Relative: 8 % (ref 3–12)
NEUTROS ABS: 4.3 10*3/uL (ref 1.7–7.7)
NEUTROS PCT: 69 % (ref 43–77)
PLATELETS: 201 10*3/uL (ref 150–400)
RBC: 4.53 MIL/uL (ref 4.22–5.81)
RDW: 11.8 % (ref 11.5–15.5)
WBC: 6.3 10*3/uL (ref 4.0–10.5)

## 2014-10-12 LAB — BASIC METABOLIC PANEL
Anion gap: 7 (ref 5–15)
BUN: 9 mg/dL (ref 6–20)
CO2: 26 mmol/L (ref 22–32)
Calcium: 9.9 mg/dL (ref 8.9–10.3)
Chloride: 103 mmol/L (ref 101–111)
Creatinine, Ser: 0.98 mg/dL (ref 0.61–1.24)
GFR calc non Af Amer: >60 mL/min (ref >60)
Glucose, Bld: 99 mg/dL (ref 70–99)
POTASSIUM: 4.2 mmol/L (ref 3.5–5.1)
Sodium: 136 mmol/L (ref 135–145)

## 2014-10-12 SURGERY — AMPUTATION DIGIT
Anesthesia: General | Laterality: Right

## 2014-10-12 MED ORDER — CEFAZOLIN SODIUM 1-5 GM-% IV SOLN
1.0000 g | Freq: Once | INTRAVENOUS | Status: AC
  Administered 2014-10-12: 1 g via INTRAVENOUS
  Filled 2014-10-12: qty 50

## 2014-10-12 MED ORDER — LORAZEPAM 1 MG PO TABS: 1.0000 mg | ORAL_TABLET | Freq: Two times a day (BID) | ORAL | Status: DC | PRN

## 2014-10-12 MED ORDER — TAMSULOSIN HCL 0.4 MG PO CAPS
0.4000 mg | ORAL_CAPSULE | Freq: Every day | ORAL | Status: DC
  Administered 2014-10-12: 0.4 mg via ORAL
  Filled 2014-10-12: qty 1

## 2014-10-12 MED ORDER — PROPOFOL 10 MG/ML IV BOLUS
INTRAVENOUS | Status: AC
  Filled 2014-10-12: qty 20

## 2014-10-12 MED ORDER — LACTATED RINGERS IV SOLN: INTRAVENOUS | Status: DC

## 2014-10-12 MED ORDER — METOCLOPRAMIDE HCL 5 MG/ML IJ SOLN: 5.0000 mg | Freq: Three times a day (TID) | INTRAMUSCULAR | Status: DC | PRN

## 2014-10-12 MED ORDER — CEFAZOLIN SODIUM-DEXTROSE 2-3 GM-% IV SOLR
2.0000 g | Freq: Four times a day (QID) | INTRAVENOUS | Status: AC
  Administered 2014-10-13 (×3): 2 g via INTRAVENOUS
  Filled 2014-10-12 (×3): qty 50

## 2014-10-12 MED ORDER — EPHEDRINE SULFATE 50 MG/ML IJ SOLN
INTRAMUSCULAR | Status: AC
  Filled 2014-10-12: qty 1

## 2014-10-12 MED ORDER — BUPIVACAINE HCL 0.25 % IJ SOLN
INTRAMUSCULAR | Status: DC | PRN
  Administered 2014-10-12: 10 mL

## 2014-10-12 MED ORDER — PROPOFOL 10 MG/ML IV BOLUS
INTRAVENOUS | Status: DC | PRN
  Administered 2014-10-12: 180 mg via INTRAVENOUS

## 2014-10-12 MED ORDER — ONDANSETRON HCL 4 MG/2ML IJ SOLN: 4.0000 mg | Freq: Four times a day (QID) | INTRAMUSCULAR | Status: DC | PRN

## 2014-10-12 MED ORDER — OXYCODONE-ACETAMINOPHEN 5-325 MG PO TABS: ORAL_TABLET | ORAL | Status: DC

## 2014-10-12 MED ORDER — SODIUM CHLORIDE 0.9 % IJ SOLN
INTRAMUSCULAR | Status: AC
  Filled 2014-10-12: qty 10

## 2014-10-12 MED ORDER — CARVEDILOL 12.5 MG PO TABS
12.5000 mg | ORAL_TABLET | Freq: Two times a day (BID) | ORAL | Status: DC
  Administered 2014-10-12: 12.5 mg via ORAL
  Filled 2014-10-12: qty 1

## 2014-10-12 MED ORDER — LIDOCAINE HCL 2 % IJ SOLN
20.0000 mL | Freq: Once | INTRAMUSCULAR | Status: AC
  Administered 2014-10-12: 400 mg via INTRADERMAL
  Filled 2014-10-12: qty 20

## 2014-10-12 MED ORDER — OXYCODONE HCL 5 MG PO TABS
5.0000 mg | ORAL_TABLET | ORAL | Status: DC | PRN
  Administered 2014-10-13 (×2): 10 mg via ORAL
  Filled 2014-10-12 (×2): qty 2

## 2014-10-12 MED ORDER — LIDOCAINE HCL (CARDIAC) 20 MG/ML IV SOLN
INTRAVENOUS | Status: AC
  Filled 2014-10-12: qty 5

## 2014-10-12 MED ORDER — MIDAZOLAM HCL 2 MG/2ML IJ SOLN
INTRAMUSCULAR | Status: AC
  Filled 2014-10-12: qty 2

## 2014-10-12 MED ORDER — IRBESARTAN 300 MG PO TABS
300.0000 mg | ORAL_TABLET | Freq: Every day | ORAL | Status: DC
  Administered 2014-10-13: 300 mg via ORAL
  Filled 2014-10-12: qty 1

## 2014-10-12 MED ORDER — CLONIDINE HCL 0.1 MG PO TABS
0.1000 mg | ORAL_TABLET | Freq: Two times a day (BID) | ORAL | Status: DC
  Administered 2014-10-12 – 2014-10-13 (×2): 0.1 mg via ORAL
  Filled 2014-10-12 (×2): qty 1

## 2014-10-12 MED ORDER — METHOCARBAMOL 500 MG PO TABS
500.0000 mg | ORAL_TABLET | Freq: Four times a day (QID) | ORAL | Status: DC | PRN
Start: 2014-10-12 — End: 2014-10-13
  Administered 2014-10-13: 500 mg via ORAL
  Filled 2014-10-12: qty 1

## 2014-10-12 MED ORDER — LACTATED RINGERS IV SOLN
INTRAVENOUS | Status: DC
  Administered 2014-10-12: 17:00:00 via INTRAVENOUS

## 2014-10-12 MED ORDER — METOCLOPRAMIDE HCL 5 MG PO TABS: 5.0000 mg | ORAL_TABLET | Freq: Three times a day (TID) | ORAL | Status: DC | PRN

## 2014-10-12 MED ORDER — FENTANYL CITRATE (PF) 250 MCG/5ML IJ SOLN
INTRAMUSCULAR | Status: AC
  Filled 2014-10-12: qty 5

## 2014-10-12 MED ORDER — MORPHINE SULFATE 4 MG/ML IJ SOLN
4.0000 mg | Freq: Once | INTRAMUSCULAR | Status: AC
  Administered 2014-10-12: 4 mg via INTRAVENOUS
  Filled 2014-10-12: qty 1

## 2014-10-12 MED ORDER — LIDOCAINE HCL (CARDIAC) 20 MG/ML IV SOLN
INTRAVENOUS | Status: DC | PRN
  Administered 2014-10-12: 80 mg via INTRAVENOUS

## 2014-10-12 MED ORDER — ONDANSETRON HCL 4 MG/2ML IJ SOLN
4.0000 mg | Freq: Once | INTRAMUSCULAR | Status: AC
  Administered 2014-10-12: 4 mg via INTRAVENOUS
  Filled 2014-10-12: qty 2

## 2014-10-12 MED ORDER — SULFAMETHOXAZOLE-TRIMETHOPRIM 800-160 MG PO TABS: 1.0000 | ORAL_TABLET | Freq: Two times a day (BID) | ORAL | Status: DC

## 2014-10-12 MED ORDER — ONDANSETRON HCL 4 MG PO TABS: 4.0000 mg | ORAL_TABLET | Freq: Four times a day (QID) | ORAL | Status: DC | PRN

## 2014-10-12 MED ORDER — TEMAZEPAM 15 MG PO CAPS
15.0000 mg | ORAL_CAPSULE | Freq: Every evening | ORAL | Status: DC | PRN
  Administered 2014-10-13: 15 mg via ORAL
  Filled 2014-10-12: qty 1

## 2014-10-12 MED ORDER — HYDROCHLOROTHIAZIDE 12.5 MG PO CAPS
12.5000 mg | ORAL_CAPSULE | Freq: Every day | ORAL | Status: DC
  Administered 2014-10-13: 12.5 mg via ORAL
  Filled 2014-10-12: qty 1

## 2014-10-12 MED ORDER — CEFAZOLIN SODIUM 1-5 GM-% IV SOLN
INTRAVENOUS | Status: DC | PRN
  Administered 2014-10-12: 1 g via INTRAVENOUS

## 2014-10-12 MED ORDER — MIDAZOLAM HCL 2 MG/2ML IJ SOLN
INTRAMUSCULAR | Status: DC | PRN
  Administered 2014-10-12: 2 mg via INTRAVENOUS

## 2014-10-12 MED ORDER — CEPHALEXIN 250 MG PO CAPS
500.0000 mg | ORAL_CAPSULE | Freq: Once | ORAL | Status: AC
  Administered 2014-10-12: 500 mg via ORAL
  Filled 2014-10-12: qty 2

## 2014-10-12 MED ORDER — FENTANYL CITRATE (PF) 250 MCG/5ML IJ SOLN
INTRAMUSCULAR | Status: DC | PRN
  Administered 2014-10-12: 50 ug via INTRAVENOUS

## 2014-10-12 MED ORDER — LACTATED RINGERS IV SOLN
INTRAVENOUS | Status: DC | PRN
  Administered 2014-10-12: 19:00:00 via INTRAVENOUS

## 2014-10-12 MED ORDER — EPHEDRINE SULFATE 50 MG/ML IJ SOLN
INTRAMUSCULAR | Status: DC | PRN
  Administered 2014-10-12 (×2): 10 mg via INTRAVENOUS

## 2014-10-12 MED ORDER — MORPHINE SULFATE 2 MG/ML IJ SOLN: 1.0000 mg | INTRAMUSCULAR | Status: DC | PRN

## 2014-10-12 MED ORDER — HYDRALAZINE HCL 20 MG/ML IJ SOLN: 5.0000 mg | INTRAMUSCULAR | Status: DC | PRN

## 2014-10-12 MED ORDER — DIPHENHYDRAMINE HCL 12.5 MG/5ML PO ELIX: 12.5000 mg | ORAL_SOLUTION | ORAL | Status: DC | PRN

## 2014-10-12 MED ORDER — ACETAMINOPHEN 650 MG RE SUPP: 650.0000 mg | Freq: Four times a day (QID) | RECTAL | Status: DC | PRN

## 2014-10-12 MED ORDER — CEFAZOLIN SODIUM-DEXTROSE 2-3 GM-% IV SOLR
2.0000 g | Freq: Four times a day (QID) | INTRAVENOUS | Status: DC
  Administered 2014-10-13: 2 g via INTRAVENOUS
  Filled 2014-10-12 (×3): qty 50

## 2014-10-12 MED ORDER — SODIUM CHLORIDE 0.9 % IV BOLUS (SEPSIS)
1000.0000 mL | Freq: Once | INTRAVENOUS | Status: AC
Start: 2014-10-12 — End: 2014-10-12
  Administered 2014-10-12: 1000 mL via INTRAVENOUS

## 2014-10-12 MED ORDER — METHOCARBAMOL 1000 MG/10ML IJ SOLN
500.0000 mg | Freq: Four times a day (QID) | INTRAVENOUS | Status: DC | PRN
  Filled 2014-10-12: qty 5

## 2014-10-12 MED ORDER — ACETAMINOPHEN 325 MG PO TABS: 650.0000 mg | ORAL_TABLET | Freq: Four times a day (QID) | ORAL | Status: DC | PRN

## 2014-10-12 MED ORDER — CEFAZOLIN SODIUM 1-5 GM-% IV SOLN
INTRAVENOUS | Status: AC
  Filled 2014-10-12: qty 50

## 2014-10-12 MED ORDER — INSULIN ASPART 100 UNIT/ML ~~LOC~~ SOLN
0.0000 [IU] | Freq: Three times a day (TID) | SUBCUTANEOUS | Status: DC
  Administered 2014-10-13: 2 [IU] via SUBCUTANEOUS

## 2014-10-12 MED ORDER — SUCCINYLCHOLINE CHLORIDE 20 MG/ML IJ SOLN
INTRAMUSCULAR | Status: DC | PRN
  Administered 2014-10-12: 140 mg via INTRAVENOUS

## 2014-10-12 MED ORDER — VALSARTAN-HYDROCHLOROTHIAZIDE 320-12.5 MG PO TABS: 1.0000 | ORAL_TABLET | Freq: Every day | ORAL | Status: DC

## 2014-10-12 MED ORDER — BUPIVACAINE HCL (PF) 0.25 % IJ SOLN
INTRAMUSCULAR | Status: AC
  Filled 2014-10-12: qty 30

## 2014-10-12 MED ORDER — ATORVASTATIN CALCIUM 80 MG PO TABS
80.0000 mg | ORAL_TABLET | Freq: Every day | ORAL | Status: DC
  Administered 2014-10-12 – 2014-10-13 (×2): 80 mg via ORAL
  Filled 2014-10-12 (×2): qty 1

## 2014-10-12 MED ORDER — SUCCINYLCHOLINE CHLORIDE 20 MG/ML IJ SOLN
INTRAMUSCULAR | Status: AC
  Filled 2014-10-12: qty 1

## 2014-10-12 MED ORDER — VITAMIN B-12 1000 MCG PO TABS
1000.0000 ug | ORAL_TABLET | Freq: Every day | ORAL | Status: DC
  Administered 2014-10-12 – 2014-10-13 (×2): 1000 ug via ORAL
  Filled 2014-10-12 (×2): qty 1

## 2014-10-12 SURGICAL SUPPLY — 45 items
BLADE LONG MED 31MMX9MM (MISCELLANEOUS)
BLADE LONG MED 31X9 (MISCELLANEOUS) IMPLANT
BNDG CMPR 9X4 STRL LF SNTH (GAUZE/BANDAGES/DRESSINGS) ×1
BNDG COHESIVE 1X5 TAN STRL LF (GAUZE/BANDAGES/DRESSINGS) ×3 IMPLANT
BNDG COHESIVE 3X5 TAN STRL LF (GAUZE/BANDAGES/DRESSINGS) IMPLANT
BNDG ESMARK 4X9 LF (GAUZE/BANDAGES/DRESSINGS) ×3 IMPLANT
BNDG GAUZE ELAST 4 BULKY (GAUZE/BANDAGES/DRESSINGS) IMPLANT
CORDS BIPOLAR (ELECTRODE) ×3 IMPLANT
CUFF TOURNIQUET SINGLE 18IN (TOURNIQUET CUFF) ×3 IMPLANT
CUFF TOURNIQUET SINGLE 24IN (TOURNIQUET CUFF) IMPLANT
DRSG KUZMA FLUFF (GAUZE/BANDAGES/DRESSINGS) IMPLANT
DURAPREP 26ML APPLICATOR (WOUND CARE) IMPLANT
GAUZE SPONGE 4X4 12PLY STRL (GAUZE/BANDAGES/DRESSINGS) IMPLANT
GAUZE XEROFORM 1X8 LF (GAUZE/BANDAGES/DRESSINGS) ×3 IMPLANT
GLOVE BIO SURGEON STRL SZ 6.5 (GLOVE) IMPLANT
GLOVE BIO SURGEONS STRL SZ 6.5 (GLOVE)
GLOVE SURG ORTHO 8.0 STRL STRW (GLOVE) ×3 IMPLANT
GOWN STRL REUS W/ TWL LRG LVL3 (GOWN DISPOSABLE) ×2 IMPLANT
GOWN STRL REUS W/ TWL XL LVL3 (GOWN DISPOSABLE) ×1 IMPLANT
GOWN STRL REUS W/TWL LRG LVL3 (GOWN DISPOSABLE) ×6
GOWN STRL REUS W/TWL XL LVL3 (GOWN DISPOSABLE) ×3
KIT BASIN OR (CUSTOM PROCEDURE TRAY) ×3 IMPLANT
KIT ROOM TURNOVER OR (KITS) ×3 IMPLANT
MANIFOLD NEPTUNE II (INSTRUMENTS) IMPLANT
NEEDLE HYPO 25GX1X1/2 BEV (NEEDLE) ×3 IMPLANT
NS IRRIG 1000ML POUR BTL (IV SOLUTION) ×3 IMPLANT
PACK ORTHO EXTREMITY (CUSTOM PROCEDURE TRAY) ×3 IMPLANT
PAD ARMBOARD 7.5X6 YLW CONV (MISCELLANEOUS) ×3 IMPLANT
PAD CAST 4YDX4 CTTN HI CHSV (CAST SUPPLIES) IMPLANT
PADDING CAST COTTON 4X4 STRL (CAST SUPPLIES)
RUBBERBAND STERILE (MISCELLANEOUS) IMPLANT
SPECIMEN JAR SMALL (MISCELLANEOUS) IMPLANT
SPLINT FINGER W/BULB (SOFTGOODS) ×3 IMPLANT
SPONGE GAUZE 4X4 12PLY STER LF (GAUZE/BANDAGES/DRESSINGS) ×3 IMPLANT
SPONGE SCRUB IODOPHOR (GAUZE/BANDAGES/DRESSINGS) IMPLANT
SUT CHROMIC 6 0 PS 4 (SUTURE) ×3 IMPLANT
SUT ETHILON 5 0 PS 2 18 (SUTURE) IMPLANT
SUT MON AB 5-0 P3 18 (SUTURE) ×3 IMPLANT
SUT SILK 4 0 PS 2 (SUTURE) IMPLANT
SUT VICRYL 4-0 PS2 18IN ABS (SUTURE) IMPLANT
SYR CONTROL 10ML LL (SYRINGE) ×3 IMPLANT
TOWEL OR 17X24 6PK STRL BLUE (TOWEL DISPOSABLE) ×3 IMPLANT
TOWEL OR 17X26 10 PK STRL BLUE (TOWEL DISPOSABLE) ×3 IMPLANT
UNDERPAD 30X30 INCONTINENT (UNDERPADS AND DIAPERS) ×3 IMPLANT
WATER STERILE IRR 1000ML POUR (IV SOLUTION) IMPLANT

## 2014-10-12 NOTE — ED Notes (Signed)
1 - $20 bill 1 - $10 bill 1 - $1 bill 1 medicare card 1 [employer] visa card - red 1 [employer] [place] visa card - silver 1 blue colored cell phone  Items placed in valuable belongings folder and given to security

## 2014-10-12 NOTE — Discharge Instructions (Signed)

## 2014-10-12 NOTE — Brief Op Note (Signed)
10/12/2014  8:17 PM  PATIENT:  Gregory Fisher  64 y.o. male  PRE-OPERATIVE DIAGNOSIS:  right ring finger amputation thru tuft  POST-OPERATIVE DIAGNOSIS:  same  PROCEDURE:  Right ring finger irrigation and debridement open fracture, open treatment distal phalanx fracture, repair skin and nail bed lacerations  SURGEON:  Surgeon(s) and Role:    * Leanora Cover, MD - Primary  PHYSICIAN ASSISTANT:   ASSISTANTS: none   ANESTHESIA:   general  EBL:     BLOOD ADMINISTERED:none  DRAINS: none   LOCAL MEDICATIONS USED:  MARCAINE     SPECIMEN:  No Specimen  DISPOSITION OF SPECIMEN:  N/A  COUNTS:  YES  TOURNIQUET:   Total Tourniquet Time Documented: Upper Arm (Right) - 30 minutes Total: Upper Arm (Right) - 30 minutes   DICTATION: .Other Dictation: Dictation Number [id number]  PLAN OF CARE: Discharge to home after PACU  PATIENT DISPOSITION:  PACU - hemodynamically stable.

## 2014-10-12 NOTE — Progress Notes (Addendum)
Pt arrived from ED, consent not signed. Pt reports that he wanted to talk to Dr. Fredna Dow prior to signing. Pt given 1g IV Ancef in ED, 2g ordered by Dr. Fredna Dow, will need to clarify dose prior to pulling med from pyxis.

## 2014-10-12 NOTE — Anesthesia Postprocedure Evaluation (Signed)
Anesthesia Post Note  Patient: Gregory Fisher  Procedure(s) Performed: Procedure(s) (LRB): IRRIGATION AND DEBRIDEMENT OF RING FINGER WITH REVISION AMPUTATION (Right)  Anesthesia type: general  Patient location: PACU  Post pain: Pain level controlled  Post assessment: Patient's Cardiovascular Status Stable  Last Vitals:  Filed Vitals:   10/12/14 2025  BP: 123/78  Pulse:   Temp: 36.6 C  Resp: 20    Post vital signs: Reviewed and stable  Level of consciousness: sedated  Complications: No apparent anesthesia complications

## 2014-10-12 NOTE — Anesthesia Preprocedure Evaluation (Signed)
Anesthesia Evaluation  Patient identified by MRN, date of birth, ID band Patient awake    Reviewed: Allergy & Precautions, NPO status , Patient's Chart, lab work & pertinent test results  History of Anesthesia Complications Negative for: history of anesthetic complications  Airway Mallampati: I  TM Distance: >3 FB Neck ROM: Full    Dental  (+) Edentulous Upper, Edentulous Lower   Pulmonary neg pulmonary ROS, former smoker,    Pulmonary exam normal       Cardiovascular hypertension, + Peripheral Vascular Disease Normal cardiovascular exam    Neuro/Psych PSYCHIATRIC DISORDERS negative neurological ROS     GI/Hepatic negative GI ROS, Neg liver ROS,   Endo/Other  negative endocrine ROS  Renal/GU negative Renal ROS     Musculoskeletal   Abdominal   Peds  Hematology   Anesthesia Other Findings   Reproductive/Obstetrics                             Anesthesia Physical Anesthesia Plan  ASA: III and emergent  Anesthesia Plan: General   Post-op Pain Management:    Induction: Intravenous, Rapid sequence and Cricoid pressure planned  Airway Management Planned: Oral ETT  Additional Equipment:   Intra-op Plan:   Post-operative Plan: Extubation in OR  Informed Consent: I have reviewed the patients History and Physical, chart, labs and discussed the procedure including the risks, benefits and alternatives for the proposed anesthesia with the patient or authorized representative who has indicated his/her understanding and acceptance.   Dental advisory given  Plan Discussed with: CRNA, Anesthesiologist and Surgeon  Anesthesia Plan Comments:         Anesthesia Quick Evaluation
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
## 2014-10-12 NOTE — Progress Notes (Signed)
Patient lives alone, only has church members to rely on for assistance. Three church members in waiting room available to give patient a ride home, but none able to stay with patient, Spoke with Dr SInger, anesthesia who advised patient unable to go home alone and must be admitted for overnight obs if nobody available to stay with patient. Dr Fredna Dow updated who will admit patient for night.

## 2014-10-12 NOTE — Op Note (Signed)
[id number] 

## 2014-10-12 NOTE — ED Notes (Signed)
Pt put right hand into a lawnmower blade. Right ring finger partially amputated just proximal to nail.
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
## 2014-10-12 NOTE — Transfer of Care (Signed)
Immediate Anesthesia Transfer of Care Note  Patient: Gregory Fisher  Procedure(s) Performed: Procedure(s): IRRIGATION AND DEBRIDEMENT OF RING FINGER WITH REVISION AMPUTATION (Right)  Patient Location: PACU  Anesthesia Type:General  Level of Consciousness: awake, alert  and oriented  Airway & Oxygen Therapy: Patient connected to nasal cannula oxygen  Post-op Assessment: Report given to RN, Post -op Vital signs reviewed and stable and Patient moving all extremities X 4  Post vital signs: Reviewed and stable  Last Vitals:  Filed Vitals:   10/12/14 1651  BP: 158/98  Pulse: 92  Temp:   Resp: 20    Complications: No apparent anesthesia complications

## 2014-10-12 NOTE — Anesthesia Procedure Notes (Signed)
Procedure Name: Intubation Date/Time: 10/12/2014 7:26 PM Performed by: Valetta Fuller Pre-anesthesia Checklist: Patient identified, Emergency Drugs available, Suction available and Patient being monitored Patient Re-evaluated:Patient Re-evaluated prior to inductionOxygen Delivery Method: Circle system utilized Preoxygenation: Pre-oxygenation with 100% oxygen Intubation Type: IV induction, Rapid sequence and Cricoid Pressure applied Laryngoscope Size: Miller and 2 Grade View: Grade I Tube type: Oral Tube size: 7.5 mm Number of attempts: 1 Airway Equipment and Method: Stylet Secured at: 23 cm Tube secured with: Tape Dental Injury: Teeth and Oropharynx as per pre-operative assessment

## 2014-10-13 DIAGNOSIS — I10 Essential (primary) hypertension: Secondary | ICD-10-CM | POA: Diagnosis not present

## 2014-10-13 DIAGNOSIS — E785 Hyperlipidemia, unspecified: Secondary | ICD-10-CM | POA: Diagnosis not present

## 2014-10-13 LAB — GLUCOSE, CAPILLARY
Glucose-Capillary: 99 mg/dL (ref 70–99)
Glucose-Capillary: 95 mg/dL (ref 70–99)
Glucose-Capillary: 132 mg/dL — ABNORMAL HIGH (ref 70–99)

## 2014-10-13 LAB — CBC
HEMATOCRIT: 38.1 % — AB (ref 39.0–52.0)
HEMOGLOBIN: 13.3 g/dL (ref 13.0–17.0)
MCH: 31.7 pg (ref 26.0–34.0)
MCHC: 34.9 g/dL (ref 30.0–36.0)
MCV: 90.9 fL (ref 78.0–100.0)
Platelets: 173 10*3/uL (ref 150–400)
RBC: 4.19 MIL/uL — ABNORMAL LOW (ref 4.22–5.81)
RDW: 12 % (ref 11.5–15.5)
WBC: 5.9 10*3/uL (ref 4.0–10.5)

## 2014-10-13 LAB — BASIC METABOLIC PANEL
Anion gap: 7 (ref 5–15)
BUN: 7 mg/dL (ref 6–20)
CHLORIDE: 103 mmol/L (ref 101–111)
CO2: 30 mmol/L (ref 22–32)
Calcium: 9.4 mg/dL (ref 8.9–10.3)
Creatinine, Ser: 1.01 mg/dL (ref 0.61–1.24)
GFR calc non Af Amer: >60 mL/min (ref >60)
GLUCOSE: 103 mg/dL — AB (ref 70–99)
POTASSIUM: 4.2 mmol/L (ref 3.5–5.1)
Sodium: 140 mmol/L (ref 135–145)

## 2014-10-13 NOTE — Progress Notes (Addendum)
Patient Demographics  Gregory Fisher, is a 64 y.o. male, DOB - 1950/07/14, CLE:[id number]  Admit date - 10/12/2014   Admitting Physician Leanora Cover, MD  Outpatient Primary MD for the patient is [name], MD  LOS -                                                             Consult follow-up note  No chief complaint on file.      Admission HPI/Brief narrative: With history of hypertension, hyperlipidemia, hyperglycemia had a surgery for his right hand after sustaining injury with lawnmower Fisher. Dr. Fredna Dow, hand surgeon has requested medical consult for medical issue management. On exam patient denies any chest pain shortness of breath nausea vomiting abdominal pain diarrhea or fever chills. Denies any loss of consciousness.  Subjective:   Gregory Fisher has, No headache, No chest pain, No abdominal pain - No Nausea, No new weakness tingling or numbness, No Cough - SOB.   Assessment & Plan    Active Problems:   HTN (hypertension)   HLD (hyperlipidemia)   Hyperglycemia   Open fracture of phalanx of right ring finger  Hypertension - Much improved after patient has been resumed on his home medication, continue with Coreg, clonidine, irbesartan, and hydrochlorothiazide, as well on PRN hydralazine.  Hyperlipidemia - Continue with statin  Hyperglycemia - Patient has been placed on insulin sliding-scale, she CBGs appear to be controlled, will monitor for the next 24 hours, if no hyperglycemia will discontinue sliding scale.  Code Status: Full  Family Communication: None at bedside     Procedures   Right ring finger irrigation and debridement open fracture, open treatment distal phalanx fracture, repair skin and nail bed lacerations 5/5     Medications  Scheduled Meds: . atorvastatin  80 mg Oral Daily  . carvedilol  12.5 mg Oral BID WC  .  ceFAZolin (ANCEF) IV  2 g  Intravenous Q6H  .  ceFAZolin (ANCEF) IV  2 g Intravenous Q6H  . cloNIDine  0.1 mg Oral BID  . irbesartan  300 mg Oral Daily   And  . hydrochlorothiazide  12.5 mg Oral Daily  . insulin aspart  0-9 Units Subcutaneous TID WC  . tamsulosin  0.4 mg Oral QHS  . vitamin B-12  1,000 mcg Oral Daily   Continuous Infusions: . lactated ringers     PRN Meds:.acetaminophen **OR** acetaminophen, diphenhydrAMINE, hydrALAZINE, methocarbamol **OR** methocarbamol (ROBAXIN)  IV, metoCLOPramide **OR** metoCLOPramide (REGLAN) injection, morphine injection, ondansetron **OR** ondansetron (ZOFRAN) IV, oxyCODONE, temazepam  DVT Prophylaxis   SCDs   Lab Results  Component Value Date   PLT 173 10/13/2014    Antibiotics    Anti-infectives    Start     Dose/Rate Route Frequency Ordered Stop   10/13/14 0100  ceFAZolin (ANCEF) IVPB 2 g/50 mL premix     2 g 100 mL/hr over 30 Minutes Intravenous Every 6 hours 10/12/14 2235 10/13/14 1859   10/12/14 2200  ceFAZolin (ANCEF) IVPB 2 g/50 mL premix     2  g 100 mL/hr over 30 Minutes Intravenous Every 6 hours 10/12/14 2155 10/13/14 1559   10/12/14 1915  ceFAZolin (ANCEF) 1-5 GM-% IVPB    Comments:  Ubaldo Glassing   : cabinet override      10/12/14 1915 10/13/14 0729   10/12/14 1430  ceFAZolin (ANCEF) IVPB 1 g/50 mL premix     1 g 100 mL/hr over 30 Minutes Intravenous  Once 10/12/14 1415 10/12/14 1650   10/12/14 1315  cephALEXin (KEFLEX) capsule 500 mg     500 mg Oral  Once 10/12/14 1307 10/12/14 1313   10/12/14 0000  sulfamethoxazole-trimethoprim (BACTRIM DS) 800-160 MG per tablet     1 tablet Oral 2 times daily 10/12/14 2022            Objective:   Filed Vitals:   10/12/14 2149 10/13/14 0115 10/13/14 0157 10/13/14 0609  BP: 162/94 152/91 105/71 136/82  Pulse: 87 79 74 82  Temp: 97.7 F (36.5 C)  97.6 F (36.4 C) 97.8 F (36.6 C)  TempSrc:      Resp: 20  20 20   Height:      Weight:      SpO2: 97%  96% 96%    Wt Readings from Last 3  Encounters:  10/12/14 99.791 kg (220 lb)  08/01/14 104.101 kg (229 lb 8 oz)  05/09/14 100.699 kg (222 lb)     Intake/Output Summary (Last 24 hours) at 10/13/14 1211 Last data filed at 10/12/14 2018  Gross per 24 hour  Intake   1950 ml  Output      0 ml  Net   1950 ml     Physical Exam  Awake Alert, Oriented X 3, No new F.N deficits, Normal affect [place].AT,PERRAL Supple Neck,No JVD, No cervical lymphadenopathy appriciated.  Symmetrical Chest wall movement, Good air movement bilaterally, CTAB RRR,No Gallops,Rubs or new Murmurs, No Parasternal Heave +ve B.Sounds, Abd Soft, No tenderness, No organomegaly appriciated, No rebound - guarding or rigidity. No Cyanosis, Clubbing or edema, No new Rash or bruise  , right ring finger wrapped in bandage   Data Review   Micro Results No results found for this or any previous visit (from the past 240 hour(s)).  Radiology Reports Dg Chest 2 View  10/12/2014   CLINICAL DATA:  Preoperative evaluation for finger injury. Hypertension.  EXAM: CHEST  2 VIEW  COMPARISON:  January 18, 2008  FINDINGS: There is slight scarring in the right perihilar region. There is no edema or consolidation. Heart size and pulmonary vascularity are normal. No adenopathy. There is postoperative change in the lower cervical spine region. A piece of wire is seen in the lateral left hemithorax, stable. There is degenerative change in the thoracic spine.  IMPRESSION: No edema or consolidation.   Electronically Signed   By: Lowella Grip III M.D.   On: 10/12/2014 15:00   Dg Finger Ring Right  10/12/2014   CLINICAL DATA:  Finger caught in lawnmower blade  EXAM: RIGHT FOURTH FINGER 2+V  COMPARISON:  Oct 09, 2008  FINDINGS: Frontal, oblique, and lateral views were obtained. There is extensive soft tissue injury to the distal aspect of the fourth digit. There is a comminuted fracture involving the distal aspect of the fourth distal phalanx with multiple displaced fracture fragments.  No dislocation. There is moderate osteoarthritic change in the fourth PIP and DIP joints. Osteoarthritic changes also noted in the adjacent third and fifth PIP and DIP joints.  IMPRESSION: Soft tissue injury to the distal fourth digit  with comminuted fracture of the distal aspect of the fourth distal phalanx. Multiple displaced fracture fragments present. No dislocation. Osteoarthritic change in multiple joints.   Electronically Signed   By: Lowella Grip III M.D.   On: 10/12/2014 13:35     CBC  Recent Labs Lab 10/12/14 1430 10/13/14 0501  WBC 6.3 5.9  HGB 14.3 13.3  HCT 40.8 38.1*  PLT 201 173  MCV 90.1 90.9  MCH 31.6 31.7  MCHC 35.0 34.9  RDW 11.8 12.0  LYMPHSABS 1.2  --   MONOABS 0.5  --   EOSABS 0.2  --   BASOSABS 0.0  --     Chemistries   Recent Labs Lab 10/12/14 1430 10/13/14 0501  NA 136 140  K 4.2 4.2  CL 103 103  CO2 26 30  GLUCOSE 99 103*  BUN 9 7  CREATININE 0.98 1.01  CALCIUM 9.9 9.4   ------------------------------------------------------------------------------------------------------------------ estimated creatinine clearance is 85.8 mL/min (by C-G formula based on Cr of 1.01). ------------------------------------------------------------------------------------------------------------------ No results for input(s): HGBA1C in the last 72 hours. ------------------------------------------------------------------------------------------------------------------ No results for input(s): CHOL, HDL, LDLCALC, TRIG, CHOLHDL, LDLDIRECT in the last 72 hours. ------------------------------------------------------------------------------------------------------------------ No results for input(s): TSH, T4TOTAL, T3FREE, THYROIDAB in the last 72 hours.  Invalid input(s): FREET3 ------------------------------------------------------------------------------------------------------------------ No results for input(s): VITAMINB12, FOLATE, FERRITIN, TIBC, IRON,  RETICCTPCT in the last 72 hours.  Coagulation profile No results for input(s): INR, PROTIME in the last 168 hours.  No results for input(s): DDIMER in the last 72 hours.  Cardiac Enzymes No results for input(s): CKMB, TROPONINI, MYOGLOBIN in the last 168 hours.  Invalid input(s): CK ------------------------------------------------------------------------------------------------------------------ Invalid input(s): POCBNP     Time Spent in minutes   20 minutes   Gregory Fisher M.D on 10/13/2014 at 12:11 PM  Between 7am to 7pm - Pager - [phone]  After 7pm go to [email or web address] - password [facility]  [facility]   Office  [phone]

## 2014-10-13 NOTE — Op Note (Signed)
NAMESYLVANUS, TELFORD NO.:  [id number]  MEDICAL RECORD NO.:  [id number]  LOCATION:  5N21C                        FACILITY:  [facility]  PHYSICIAN:  Leanora Cover, MD        DATE OF BIRTH:  1950/09/27  DATE OF PROCEDURE:  10/12/2014 DATE OF DISCHARGE:                              OPERATIVE REPORT   PREOPERATIVE DIAGNOSIS:  Right ring finger lawnmower injury with open distal phalanx fracture, skin and nail bed lacerations.  POSTOPERATIVE DIAGNOSIS:  Right ring finger lawnmower injury with open distal phalanx fracture, skin and nail bed lacerations.  PROCEDURE:   1. Right ring finger irrigation and debridement of open Fracture including bone fragments 2. Open treatment of open distal phalanx tuft fracture 3. Repair of skin laceration 4. Repair of nail bed laceration  SURGEON:  Leanora Cover, MD  ASSISTANT:  None.  ANESTHESIA:  General.  IV FLUIDS:  Per anesthesia flow sheet.  ESTIMATED BLOOD LOSS:  Minimal.  COMPLICATIONS:  None.  SPECIMENS:  None.  TOURNIQUET TIME:  30 minutes.  DISPOSITION:  Stable to PACU.  INDICATIONS:  Mr. Tendler is a 64 year old right-hand dominant male who states he injured his right ring finger on a lawnmower earlier today. He was seen at the [facility] Emergency Department where he was evaluated.  I was consulted for management of injury.  On examination, he had injury through the nail bed of the finger.  It appeared initially to be amputated.  I recommended going to the operating room for revision amputation of the finger.  Risks, benefits, and alternatives of the surgery were discussed including risk of blood loss, infection, damage to nerves, vessels, tendons, ligaments, bone; failure of surgery; need for additional surgery, complications with wound healing, continued pain, need for further surgery, and nail deformity.  He voiced understanding of these risks and elected to proceed.  OPERATIVE COURSE:  After being  identified preoperatively by myself, the patient and I agreed upon procedure and site of procedure.  The surgical site was marked.  The risks, benefits, and alternatives of surgery were reviewed and he wished to proceed.  Surgical consent had been signed. He was given IV Ancef as preoperative antibiotic coverage.  He was transported to the operating room and placed on the operating room table in a supine position with the right upper extremity on arm board. General anesthesia was induced by anesthesiologist.  Right upper extremity was prepped and draped in normal sterile orthopedic fashion. Surgical pause was performed between surgeons, anesthesia, and operating room staff, and all were in agreement as to the patient, procedure, and site of procedure.  Tourniquet at the proximal aspect of the extremity was inflated to 250 mmHg after exsanguination of the limb with an Esmarch bandage.  The wound was explored.  Sutures from the emergency department were removed.  There was small amount of gross contamination what appeared to be grass.  This was removed.  There was no other gross contamination.  There were multiple small bone fragments that required removal to allow closure of soft tissues.  There did appear to be enough nail bed tissue remaining to perform a repair rather than revision amputation.  The wound  was copiously irrigated with 1000 mL of sterile saline by bulb syringe.  The skin was debrided sharply with the scissors to remove any devitalized tissue.  The skin was repaired with 5-0 Monocryl suture in an interrupted fashion.  The nail bed was repaired with 6-0 chromic suture in an interrupted fashion.  This apposed all soft tissues well.  There was coverage of the bone.  A piece of Xeroform was placed in the nail fold and all wounds were dressed with sterile Xeroform, 4 x 4, and wrapped with Coban dressing lightly.  Alumafoam splint was placed and wrapped with a Coban dressing  lightly.  The tourniquet was deflated at 30 minutes.  Fingertips were pink with brisk capillary refill after deflation of the tourniquet.  Operative drapes were broken down and the patient was awoken from anesthesia safely.  He was transferred back to the stretcher and taken to PACU in stable condition.  I will see him back in the office in 1 week for postoperative followup.  I will give him Percocet 5/325, one to two p.o. q.6 hours p.r.n. pain, dispensed #40, and Bactrim DS 1 p.o. b.i.d. x7 days.     Leanora Cover, MD     KK/MEDQ  D:  10/12/2014  T:  10/13/2014  Job:  [id number]

## 2014-10-14 ENCOUNTER — Encounter: Payer: Self-pay | Admitting: Family Medicine

## 2014-10-16 ENCOUNTER — Encounter (HOSPITAL_COMMUNITY): Payer: Self-pay | Admitting: Orthopedic Surgery

## 2014-10-17 NOTE — Discharge Summary (Signed)
NAMEDESTAN, FRANCHINI NO.:  [id number]  MEDICAL RECORD NO.:  [id number]  LOCATION:  5N21C                        FACILITY:  [place] [facility]  PHYSICIAN:  Leanora Cover, MD        DATE OF BIRTH:  02/25/1951  DATE OF ADMISSION:  10/12/2014 DATE OF DISCHARGE:  10/13/2014                              DISCHARGE SUMMARY   PROCEDURES:  Right ring finger irrigation and debridement of open fracture, open treatment of distal phalanx fracture.  Repair of skin and nail bed lacerations.  HISTORY:  Mr. Millan is a 64 year old right-hand dominant male, who injured his right ring finger on a lawn mower earlier the day of admission, previously seen in the emergency department.  I was consulted for management of injury.  I recommended revision amputation versus repair in the operating room.  Risks, benefits, and alternatives of surgery were discussed including risk of blood loss; infection; damage to nerves, vessels, tendons, ligaments, bone; failure of surgery; need for additional surgery; complications with wound healing; continued pain; nonunion; malunion; stiffness.  He voiced understanding of these risks and elected to proceed.  HOSPITAL COURSE:  Mr. Lile was taken to the operating room on the day of admission for the above-noted procedures.  He tolerated the procedure well.  He did not have anybody who could stay with him postprocedure and was kept overnight for observation.  He was discharged home the following day in stable condition.  He was given a prescription for Percocet 5/325, 1-2 p.o. q.6 hours p.r.n. pain, dispensed #40, and Bactrim DS 1 p.o. b.i.d. x7 days.  He will follow up in the office in 1 week.     Leanora Cover, MD     KK/MEDQ  D:  10/16/2014  T:  10/17/2014  Job:  [id number]

## 2014-10-26 ENCOUNTER — Other Ambulatory Visit: Payer: Self-pay | Admitting: Family Medicine

## 2014-10-30 ENCOUNTER — Encounter: Payer: Self-pay | Admitting: Family Medicine

## 2014-10-30 ENCOUNTER — Ambulatory Visit (INDEPENDENT_AMBULATORY_CARE_PROVIDER_SITE_OTHER): Payer: Medicare Other | Admitting: Family Medicine

## 2014-10-30 VITALS — BP 108/82 | HR 68 | Temp 97.8°F | Wt 219.5 lb

## 2014-10-30 DIAGNOSIS — I10 Essential (primary) hypertension: Secondary | ICD-10-CM | POA: Diagnosis not present

## 2014-10-30 DIAGNOSIS — S62604D Fracture of unspecified phalanx of right ring finger, subsequent encounter for fracture with routine healing: Secondary | ICD-10-CM

## 2014-10-30 DIAGNOSIS — E785 Hyperlipidemia, unspecified: Secondary | ICD-10-CM | POA: Diagnosis not present

## 2014-10-30 DIAGNOSIS — R739 Hyperglycemia, unspecified: Secondary | ICD-10-CM

## 2014-10-30 DIAGNOSIS — E669 Obesity, unspecified: Secondary | ICD-10-CM | POA: Diagnosis not present

## 2014-10-30 LAB — GLUCOSE, POCT (MANUAL RESULT ENTRY): POC GLUCOSE: 103 mg/dL — AB (ref 70–99)

## 2014-10-30 MED ORDER — LORAZEPAM 1 MG PO TABS: 1.0000 mg | ORAL_TABLET | Freq: Every day | ORAL | Status: DC | PRN

## 2014-10-30 MED ORDER — TRAMADOL HCL 50 MG PO TABS: 50.0000 mg | ORAL_TABLET | Freq: Two times a day (BID) | ORAL | Status: DC | PRN

## 2014-10-30 NOTE — Assessment & Plan Note (Signed)
Chronic, stable. Continue regimen. Tolerating lower clonidine dose well.

## 2014-10-30 NOTE — Assessment & Plan Note (Signed)
Check random cbg today. Not due for labwork yet. Reassured 132 in hospital ok (after shortcake.)

## 2014-10-30 NOTE — Patient Instructions (Addendum)
You are doing well. Good to see you today, call us with question. Return in 6 months for physical.

## 2014-10-30 NOTE — Assessment & Plan Note (Addendum)
Recovering well. Has f/u with hand surgery next week.

## 2014-10-30 NOTE — Assessment & Plan Note (Signed)
Chronic, stable. Continue lipitor 80mg daily.  

## 2014-10-30 NOTE — Progress Notes (Signed)
BP 108/82 mmHg  Pulse 68  Temp(Src) 97.8 F (36.6 C) (Oral)  Wt 219 lb 8 oz (99.565 kg)  SpO2 98%   CC: 3 mo f/u visit  Subjective:    Patient ID: Gregory Fisher, male    DOB: 1950-07-29, 64 y.o.   MRN: [id number]  HPI: Gregory Fisher is a 64 y.o. male presenting on 10/30/2014 for Follow-up   Seen earlier this month at ER with R ring finger open phalanx fracture suffered by lawn mower s/p OR debridement and repair of nail bed lacerations. Recovered well from this.   HTN - Compliant with current antihypertensive regimen of diovan hct 320/25 mg daily, carvedilol 12.5mg  bid, and clonidine 0.1mg  bid. Does check blood pressures at home. No low blood pressure readings or symptoms of dizziness/syncope. Denies HA, vision changes, CP/tightness, SOB, leg swelling.    HLD - compliant and tolerating lipitor 80mg  daily.  Sugar up to 132 in hospital after shortcake, he was given insulin. Worried about elevated sugars. Not fasting yet.   Relevant past medical, surgical, family and social history reviewed and updated as indicated. Interim medical history since our last visit reviewed. Allergies and medications reviewed and updated. Current Outpatient Prescriptions on File Prior to Visit  Medication Sig  . aspirin EC 81 MG tablet Take 81 mg by mouth daily.   Marland Kitchen atorvastatin (LIPITOR) 80 MG tablet Take 1 tablet (80 mg total) by mouth daily.  . Calcium Carbonate-Vitamin D (CALCIUM 600+D) 600-400 MG-UNIT per tablet Take 1 tablet by mouth daily.  . carvedilol (COREG) 12.5 MG tablet TAKE 1 BY MOUTH TWICE DAILY WITH A MEAL  . cloNIDine (CATAPRES) 0.1 MG tablet TAKE 1 TABLET (0.1 MG TOTAL) BY MOUTH 2 (TWO) TIMES DAILY.  . magnesium hydroxide (MILK OF MAGNESIA) 400 MG/5ML suspension Take 5 mL by mouth daily as needed for constipation.  Marland Kitchen oxyCODONE-acetaminophen (PERCOCET) 5-325 MG per tablet 1-2 tabs po q6 hours prn pain  . psyllium (METAMUCIL) 58.6 % powder Take 1 packet by mouth daily as needed.   .  tamsulosin (FLOMAX) 0.4 MG CAPS capsule Take 1 capsule (0.4 mg total) by mouth at bedtime.  . valsartan-hydrochlorothiazide (DIOVAN-HCT) 320-12.5 MG per tablet TAKE 1 TABLET BY MOUTH DAILY.  . vitamin C (ASCORBIC ACID) 500 MG tablet Take 500 mg by mouth daily.   No current facility-administered medications on file prior to visit.    Review of Systems Per HPI unless specifically indicated above     Objective:    BP 108/82 mmHg  Pulse 68  Temp(Src) 97.8 F (36.6 C) (Oral)  Wt 219 lb 8 oz (99.565 kg)  SpO2 98%  Wt Readings from Last 3 Encounters:  10/30/14 219 lb 8 oz (99.565 kg)  10/12/14 220 lb (99.791 kg)  08/01/14 229 lb 8 oz (104.101 kg)    Physical Exam  Constitutional: He appears well-developed and well-nourished. No distress.  HENT:  Mouth/Throat: Oropharynx is clear and moist. No oropharyngeal exudate.  Cardiovascular: Normal rate, regular rhythm, normal heart sounds and intact distal pulses.   No murmur heard. Pulmonary/Chest: Effort normal and breath sounds normal. No respiratory distress. He has no wheezes. He has no rales.  Musculoskeletal: He exhibits no edema.  Skin: Skin is warm and dry. No rash noted.  Psychiatric: He has a normal mood and affect.  Nursing note and vitals reviewed.  Lab Results  Component Value Date   HGBA1C 5.4 05/09/2014      Assessment & Plan:   Problem List Items  Addressed This Visit    HLD (hyperlipidemia)    Chronic, stable. Continue lipitor 80mg  daily.      HTN (hypertension) - Primary    Chronic, stable. Continue regimen. Tolerating lower clonidine dose well.      Hyperglycemia    Check random cbg today. Not due for labwork yet. Reassured 132 in hospital ok (after shortcake.)      Obesity   Relevant Orders   Glucose (CBG)   Open fracture of phalanx of right ring finger    Recovering well. Has f/u with hand surgery next week.          Follow up plan: Return in about 6 months (around 05/02/2015), or as needed, for  annual exam, prior fasting for blood work.

## 2014-11-28 ENCOUNTER — Other Ambulatory Visit: Payer: Self-pay | Admitting: Family Medicine

## 2014-11-28 NOTE — Telephone Encounter (Signed)
Ok to refill 

## 2014-11-28 NOTE — Telephone Encounter (Signed)
plz phone in. 

## 2014-11-28 NOTE — Telephone Encounter (Signed)
Rx's called in as directed.  

## 2014-12-20 ENCOUNTER — Telehealth: Payer: Self-pay

## 2014-12-20 NOTE — Telephone Encounter (Signed)
PA required for Valsartan-Hydrochlorothiazide. Placed in your in box for completion.

## 2014-12-20 NOTE — Telephone Encounter (Signed)
[employer] left v/m that copy of non formulary exception form was faxed to [facility] (form placed on Kims desk) for valsartan-HCTZ.

## 2014-12-20 NOTE — Telephone Encounter (Signed)
filled and in Kim's box. 

## 2014-12-20 NOTE — Telephone Encounter (Signed)
Form faxed. Will await determination. 

## 2014-12-21 NOTE — Telephone Encounter (Signed)
[employer] left v/m; received form but needs answer to questions 4 and 5. Request refax form to [phone] or call [phone].

## 2014-12-21 NOTE — Telephone Encounter (Signed)
Form re-faxed

## 2014-12-22 NOTE — Telephone Encounter (Signed)
Gregory Fisher with [facility] medicare left v/m that valsartan HCTZ has been approved for one yr; effective 12/21/14; approval letter to follow.

## 2014-12-22 NOTE — Telephone Encounter (Signed)
Will await letter

## 2015-01-08 ENCOUNTER — Other Ambulatory Visit: Payer: Self-pay | Admitting: Family Medicine

## 2015-01-08 NOTE — Telephone Encounter (Signed)
Received refill request electronically. Last refill Lorazepam 11/28/14 #40 Tramadol #30 same date Is it okay to refill?

## 2015-01-09 NOTE — Telephone Encounter (Signed)
Rx's called in as directed.  

## 2015-01-09 NOTE — Telephone Encounter (Signed)
plz phone in. 

## 2015-01-14 ENCOUNTER — Other Ambulatory Visit: Payer: Self-pay | Admitting: Family Medicine

## 2015-01-18 ENCOUNTER — Other Ambulatory Visit: Payer: BC Managed Care – PPO

## 2015-01-25 ENCOUNTER — Encounter: Payer: BC Managed Care – PPO | Admitting: Family Medicine

## 2015-02-13 ENCOUNTER — Other Ambulatory Visit: Payer: Self-pay | Admitting: Family Medicine

## 2015-02-13 NOTE — Telephone Encounter (Signed)
plz phone in. 

## 2015-02-13 NOTE — Telephone Encounter (Signed)
Rx's called in as directed.  

## 2015-02-13 NOTE — Telephone Encounter (Signed)
Ok to refill 

## 2015-02-15 ENCOUNTER — Other Ambulatory Visit: Payer: Self-pay | Admitting: Family Medicine

## 2015-02-20 ENCOUNTER — Other Ambulatory Visit: Payer: Self-pay | Admitting: *Deleted

## 2015-02-20 MED ORDER — CLONIDINE HCL 0.1 MG PO TABS: ORAL_TABLET | ORAL | Status: DC

## 2015-03-26 ENCOUNTER — Other Ambulatory Visit: Payer: Self-pay | Admitting: Family Medicine

## 2015-03-26 NOTE — Telephone Encounter (Signed)
Ok to refill 

## 2015-03-27 ENCOUNTER — Other Ambulatory Visit: Payer: Self-pay | Admitting: Family Medicine

## 2015-03-27 NOTE — Telephone Encounter (Signed)
Rx's called in as directed.  

## 2015-03-27 NOTE — Telephone Encounter (Signed)
plz phone in. 

## 2015-04-17 ENCOUNTER — Ambulatory Visit (INDEPENDENT_AMBULATORY_CARE_PROVIDER_SITE_OTHER): Payer: Medicare Other | Admitting: Family Medicine

## 2015-04-17 ENCOUNTER — Encounter: Payer: Self-pay | Admitting: Family Medicine

## 2015-04-17 VITALS — BP 136/96 | HR 80 | Temp 97.5°F | Ht 67.75 in | Wt 236.0 lb

## 2015-04-17 DIAGNOSIS — E785 Hyperlipidemia, unspecified: Secondary | ICD-10-CM

## 2015-04-17 DIAGNOSIS — Z23 Encounter for immunization: Secondary | ICD-10-CM

## 2015-04-17 DIAGNOSIS — M8580 Other specified disorders of bone density and structure, unspecified site: Secondary | ICD-10-CM

## 2015-04-17 DIAGNOSIS — N400 Enlarged prostate without lower urinary tract symptoms: Secondary | ICD-10-CM | POA: Diagnosis not present

## 2015-04-17 DIAGNOSIS — I712 Thoracic aortic aneurysm, without rupture, unspecified: Secondary | ICD-10-CM

## 2015-04-17 DIAGNOSIS — I6529 Occlusion and stenosis of unspecified carotid artery: Secondary | ICD-10-CM

## 2015-04-17 DIAGNOSIS — Z0000 Encounter for general adult medical examination without abnormal findings: Secondary | ICD-10-CM | POA: Diagnosis not present

## 2015-04-17 DIAGNOSIS — Z1211 Encounter for screening for malignant neoplasm of colon: Secondary | ICD-10-CM

## 2015-04-17 DIAGNOSIS — I10 Essential (primary) hypertension: Secondary | ICD-10-CM

## 2015-04-17 DIAGNOSIS — Z7189 Other specified counseling: Secondary | ICD-10-CM | POA: Insufficient documentation

## 2015-04-17 DIAGNOSIS — IMO0001 Reserved for inherently not codable concepts without codable children: Secondary | ICD-10-CM

## 2015-04-17 LAB — LIPID PANEL
CHOL/HDL RATIO: 3
CHOLESTEROL: 152 mg/dL (ref 0–200)
HDL: 45.2 mg/dL (ref >39.00)
LDL Cholesterol: 89 mg/dL (ref 0–99)
NonHDL: 107.16
TRIGLYCERIDES: 89 mg/dL (ref 0.0–149.0)
VLDL: 17.8 mg/dL (ref 0.0–40.0)

## 2015-04-17 LAB — BASIC METABOLIC PANEL
BUN: 12 mg/dL (ref 6–23)
CO2: 31 mEq/L (ref 19–32)
Calcium: 10.5 mg/dL (ref 8.4–10.5)
Chloride: 101 mEq/L (ref 96–112)
Creatinine, Ser: 1.06 mg/dL (ref 0.40–1.50)
GFR: 74.76 mL/min (ref >60.00)
GLUCOSE: 105 mg/dL — AB (ref 70–99)
Potassium: 4.6 mEq/L (ref 3.5–5.1)
SODIUM: 138 meq/L (ref 135–145)

## 2015-04-17 LAB — PSA: PSA: 0.29 ng/mL (ref 0.10–4.00)

## 2015-04-17 NOTE — Progress Notes (Signed)
Pre visit review using our clinic review tool, if applicable. No additional management support is needed unless otherwise documented below in the visit note. 
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
## 2015-04-17 NOTE — Assessment & Plan Note (Signed)
Consider rpt carotid US in near future.

## 2015-04-17 NOTE — Patient Instructions (Addendum)
Flu shot today. Labwork today and stool kit today. Call your insurance about the shingles shot to see if it is covered or how much it would cost and where is cheaper (here or pharmacy).  If you want to receive here, call for nurse visit.  We will check PSA today and will send records to Dr Gaynelle Arabian.  Work on increased walking to help control weight. Return in 6 months for follow up visit

## 2015-04-17 NOTE — Assessment & Plan Note (Signed)

## 2015-04-17 NOTE — Assessment & Plan Note (Signed)
Chronic, stable. bp mildly elevated today but no changes made.

## 2015-04-17 NOTE — Assessment & Plan Note (Signed)
Advanced directive: does not have set up. Will provide with advanced directive packet.

## 2015-04-17 NOTE — Assessment & Plan Note (Signed)
Pending f/u with Dr Gaynelle Arabian. Will check PSA and route to urology.

## 2015-04-17 NOTE — Addendum Note (Signed)
Addended by: Royann Shivers A on: 04/17/2015 04:37 PM   Modules accepted: Orders

## 2015-04-17 NOTE — Assessment & Plan Note (Signed)
Stable echo 04/2013.

## 2015-04-17 NOTE — Assessment & Plan Note (Signed)
Discussed healthy diet and lifestyle changes to affect sustainable weight loss  

## 2015-04-17 NOTE — Assessment & Plan Note (Signed)
Continue calcium, vitamin D daily. H/o hypogonadism?

## 2015-04-17 NOTE — Assessment & Plan Note (Addendum)
Check FLP today. Continue high dose lipitor.

## 2015-04-18 ENCOUNTER — Encounter: Payer: Self-pay | Admitting: *Deleted

## 2015-04-24 ENCOUNTER — Other Ambulatory Visit: Payer: Medicare Other

## 2015-04-24 DIAGNOSIS — Z1211 Encounter for screening for malignant neoplasm of colon: Secondary | ICD-10-CM

## 2015-04-24 LAB — FECAL OCCULT BLOOD, GUAIAC: FECAL OCCULT BLD: NEGATIVE

## 2015-04-24 LAB — FECAL OCCULT BLOOD, IMMUNOCHEMICAL: FECAL OCCULT BLD: NEGATIVE

## 2015-04-26 ENCOUNTER — Encounter: Payer: Self-pay | Admitting: *Deleted

## 2015-04-30 ENCOUNTER — Other Ambulatory Visit: Payer: Self-pay | Admitting: Family Medicine

## 2015-04-30 NOTE — Telephone Encounter (Signed)
plz phone in. 

## 2015-04-30 NOTE — Telephone Encounter (Signed)
Ok to refill 

## 2015-05-01 NOTE — Telephone Encounter (Signed)
Rx's called in as directed.  

## 2015-05-17 ENCOUNTER — Other Ambulatory Visit: Payer: Self-pay | Admitting: Family Medicine

## 2015-05-31 ENCOUNTER — Other Ambulatory Visit: Payer: Self-pay | Admitting: Family Medicine

## 2015-05-31 NOTE — Telephone Encounter (Signed)
Rx's called in as directed.  

## 2015-05-31 NOTE — Telephone Encounter (Signed)
plz phone in. 

## 2015-05-31 NOTE — Telephone Encounter (Signed)
Ok to refill 

## 2015-07-04 ENCOUNTER — Other Ambulatory Visit: Payer: Self-pay | Admitting: Family Medicine

## 2015-07-04 NOTE — Telephone Encounter (Signed)
Ok to refill 

## 2015-07-05 NOTE — Telephone Encounter (Signed)
plz phone in. 

## 2015-07-05 NOTE — Telephone Encounter (Signed)
Rx's called in as directed.  

## 2015-07-17 ENCOUNTER — Other Ambulatory Visit: Payer: Self-pay | Admitting: *Deleted

## 2015-07-17 MED ORDER — ATORVASTATIN CALCIUM 80 MG PO TABS: ORAL_TABLET | ORAL | Status: DC

## 2015-07-17 MED ORDER — VALSARTAN-HYDROCHLOROTHIAZIDE 320-12.5 MG PO TABS: ORAL_TABLET | ORAL | Status: DC

## 2015-07-17 MED ORDER — CLONIDINE HCL 0.1 MG PO TABS: ORAL_TABLET | ORAL | Status: DC

## 2015-07-17 MED ORDER — CARVEDILOL 12.5 MG PO TABS: ORAL_TABLET | ORAL | Status: DC

## 2015-07-18 DIAGNOSIS — N401 Enlarged prostate with lower urinary tract symptoms: Secondary | ICD-10-CM | POA: Diagnosis not present

## 2015-07-18 DIAGNOSIS — N3281 Overactive bladder: Secondary | ICD-10-CM | POA: Diagnosis not present

## 2015-07-18 DIAGNOSIS — N138 Other obstructive and reflux uropathy: Secondary | ICD-10-CM | POA: Diagnosis not present

## 2015-07-18 DIAGNOSIS — Z0000 Encounter for general adult medical examination without abnormal findings: Secondary | ICD-10-CM | POA: Diagnosis not present

## 2015-07-23 DIAGNOSIS — M7541 Impingement syndrome of right shoulder: Secondary | ICD-10-CM | POA: Diagnosis not present

## 2015-07-24 ENCOUNTER — Other Ambulatory Visit: Payer: Self-pay | Admitting: *Deleted

## 2015-08-01 DIAGNOSIS — I10 Essential (primary) hypertension: Secondary | ICD-10-CM | POA: Diagnosis not present

## 2015-08-01 DIAGNOSIS — M5412 Radiculopathy, cervical region: Secondary | ICD-10-CM | POA: Diagnosis not present

## 2015-08-04 DIAGNOSIS — M5412 Radiculopathy, cervical region: Secondary | ICD-10-CM | POA: Diagnosis not present

## 2015-08-04 DIAGNOSIS — M4322 Fusion of spine, cervical region: Secondary | ICD-10-CM | POA: Diagnosis not present

## 2015-08-08 DIAGNOSIS — N401 Enlarged prostate with lower urinary tract symptoms: Secondary | ICD-10-CM | POA: Diagnosis not present

## 2015-08-08 DIAGNOSIS — N138 Other obstructive and reflux uropathy: Secondary | ICD-10-CM | POA: Diagnosis not present

## 2015-08-08 DIAGNOSIS — Z0000 Encounter for general adult medical examination without abnormal findings: Secondary | ICD-10-CM | POA: Diagnosis not present

## 2015-08-08 DIAGNOSIS — R351 Nocturia: Secondary | ICD-10-CM | POA: Diagnosis not present

## 2015-08-08 DIAGNOSIS — N3281 Overactive bladder: Secondary | ICD-10-CM | POA: Diagnosis not present

## 2015-08-14 ENCOUNTER — Other Ambulatory Visit: Payer: Self-pay | Admitting: Urology

## 2015-08-15 ENCOUNTER — Other Ambulatory Visit: Payer: Self-pay | Admitting: Family Medicine

## 2015-08-15 NOTE — Telephone Encounter (Signed)
Ok to refill 

## 2015-08-16 DIAGNOSIS — M5412 Radiculopathy, cervical region: Secondary | ICD-10-CM | POA: Diagnosis not present

## 2015-08-16 DIAGNOSIS — G5623 Lesion of ulnar nerve, bilateral upper limbs: Secondary | ICD-10-CM | POA: Diagnosis not present

## 2015-08-16 NOTE — Telephone Encounter (Signed)
Rx's called in as directed.  

## 2015-08-16 NOTE — Telephone Encounter (Signed)
plz phone in. 

## 2015-08-22 DIAGNOSIS — M7541 Impingement syndrome of right shoulder: Secondary | ICD-10-CM | POA: Diagnosis not present

## 2015-08-22 DIAGNOSIS — M19012 Primary osteoarthritis, left shoulder: Secondary | ICD-10-CM | POA: Diagnosis not present

## 2015-08-28 DIAGNOSIS — M4802 Spinal stenosis, cervical region: Secondary | ICD-10-CM | POA: Diagnosis not present

## 2015-08-28 DIAGNOSIS — G5623 Lesion of ulnar nerve, bilateral upper limbs: Secondary | ICD-10-CM | POA: Diagnosis not present

## 2015-08-28 DIAGNOSIS — G5601 Carpal tunnel syndrome, right upper limb: Secondary | ICD-10-CM | POA: Diagnosis not present

## 2015-08-28 NOTE — Patient Instructions (Addendum)
TRAVER FAZZINI  08/29/2015   Your procedure is scheduled on: Monday August 10, 2015  Report to [facility]  Entrance take [place]  elevators to 3rd floor to  [place] at 7:00 AM.  Call this number if you have problems the morning of surgery [phone]   Remember: ONLY 1 PERSON MAY GO WITH YOU TO SHORT STAY TO GET  READY MORNING OF [place].  Do not eat food or drink liquids :After Midnight.     Take these medicines the morning of surgery with A SIP OF WATER: Carvedilol (Coreg)                               You may not have any metal on your body including hair pins and              piercings  Do not wear jewelry,  lotions, powders or colognes, deodorant             Men may shave face and neck.   Do not bring valuables to the hospital. [place].  Contacts, dentures or bridgework may not be worn into surgery.      Patients discharged the day of surgery will not be allowed to drive home.  Name and phone number of your driver:Jim [place] Crane (friend)  _____________________________________________________________________             [facility] - Preparing for Surgery Before surgery, you can play an important role.  Because skin is not sterile, your skin needs to be as free of germs as possible.  You can reduce the number of germs on your skin by washing with CHG (chlorahexidine gluconate) soap before surgery.  CHG is an antiseptic cleaner which kills germs and bonds with the skin to continue killing germs even after washing. Please DO NOT use if you have an allergy to CHG or antibacterial soaps.  If your skin becomes reddened/irritated stop using the CHG and inform your nurse when you arrive at Short Stay. Do not shave (including legs and underarms) for at least 48 hours prior to the first CHG shower.  You may shave your face/neck. Please follow these instructions carefully:  1.  Shower with CHG Soap  the night before surgery and the  morning of Surgery.  2.  If you choose to wash your hair, wash your hair first as usual with your  normal  shampoo.  3.  After you shampoo, rinse your hair and body thoroughly to remove the  shampoo.                           4.  Use CHG as you would any other liquid soap.  You can apply chg directly  to the skin and wash                       Gently with a scrungie or clean washcloth.  5.  Apply the CHG Soap to your body ONLY FROM THE NECK DOWN.   Do not use on face/ open  Wound or open sores. Avoid contact with eyes, ears mouth and genitals (private parts).                       Wash face,  Genitals (private parts) with your normal soap.             6.  Wash thoroughly, paying special attention to the area where your surgery  will be performed.  7.  Thoroughly rinse your body with warm water from the neck down.  8.  DO NOT shower/wash with your normal soap after using and rinsing off  the CHG Soap.                9.  Pat yourself dry with a clean towel.            10.  Wear clean pajamas.            11.  Place clean sheets on your bed the night of your first shower and do not  sleep with pets. Day of Surgery : Do not apply any lotions/deodorants the morning of surgery.  Please wear clean clothes to the hospital/surgery center.  FAILURE TO FOLLOW THESE INSTRUCTIONS MAY RESULT IN THE CANCELLATION OF YOUR SURGERY PATIENT SIGNATURE_________________________________  NURSE SIGNATURE__________________________________  ________________________________________________________________________

## 2015-08-29 ENCOUNTER — Encounter (HOSPITAL_COMMUNITY)
Admission: RE | Admit: 2015-08-29 | Discharge: 2015-08-29 | Disposition: A | Discharge status: Home / Self Care | Payer: Medicare Other | Source: Ambulatory Visit | Attending: Urology | Admitting: Urology

## 2015-08-29 ENCOUNTER — Telehealth: Payer: Self-pay

## 2015-08-29 ENCOUNTER — Encounter (HOSPITAL_COMMUNITY): Payer: Self-pay

## 2015-08-29 DIAGNOSIS — N400 Enlarged prostate without lower urinary tract symptoms: Secondary | ICD-10-CM | POA: Insufficient documentation

## 2015-08-29 DIAGNOSIS — Z01812 Encounter for preprocedural laboratory examination: Secondary | ICD-10-CM | POA: Diagnosis not present

## 2015-08-29 HISTORY — DX: Frequency of micturition: R35.0

## 2015-08-29 HISTORY — DX: Nocturia: R35.1

## 2015-08-29 HISTORY — DX: Reserved for inherently not codable concepts without codable children: IMO0001

## 2015-08-29 HISTORY — DX: Hesitancy of micturition: R39.11

## 2015-08-29 HISTORY — DX: Personal history of other diseases of the respiratory system: Z87.09

## 2015-08-29 HISTORY — DX: Tinnitus, unspecified ear: H93.19

## 2015-08-29 HISTORY — DX: Anesthesia of skin: R20.0

## 2015-08-29 LAB — CBC
HCT: 42.4 % (ref 39.0–52.0)
Hemoglobin: 14.3 g/dL (ref 13.0–17.0)
MCH: 32.4 pg (ref 26.0–34.0)
MCHC: 33.7 g/dL (ref 30.0–36.0)
MCV: 95.9 fL (ref 78.0–100.0)
PLATELETS: 237 10*3/uL (ref 150–400)
RBC: 4.42 MIL/uL (ref 4.22–5.81)
RDW: 12.6 % (ref 11.5–15.5)
WBC: 7.5 10*3/uL (ref 4.0–10.5)

## 2015-08-29 LAB — BASIC METABOLIC PANEL
Anion gap: 7 (ref 5–15)
BUN: 19 mg/dL (ref 6–20)
CALCIUM: 9.9 mg/dL (ref 8.9–10.3)
CHLORIDE: 106 mmol/L (ref 101–111)
CO2: 29 mmol/L (ref 22–32)
CREATININE: 0.98 mg/dL (ref 0.61–1.24)
GFR calc Af Amer: >60 mL/min (ref >60)
GFR calc non Af Amer: >60 mL/min (ref >60)
GLUCOSE: 110 mg/dL — AB (ref 65–99)
Potassium: 5.5 mmol/L — ABNORMAL HIGH (ref 3.5–5.1)
Sodium: 142 mmol/L (ref 135–145)

## 2015-08-29 NOTE — Telephone Encounter (Signed)
Gregory Fisher with Dr Paula Compton office left v/m; pt has appt to see Webb Silversmith NP 08/30/15 at 8:45; pt is scheduled for surgery on 09/10/15 and hospital is requesting reeval aneurysm from 2014; Gregory Fisher wants to know if echo could be ordered; last echo was 04/28/2013. Gregory Fisher request cb to let her know if echo could be ordered prior to 09/10/15 surgical date or does R Baity NP thinks needs cardiology referral.

## 2015-08-29 NOTE — Telephone Encounter (Signed)
Message left advising Gregory Fisher.

## 2015-08-29 NOTE — Progress Notes (Signed)
BMP results in epic per PAT visit 08/29/2015 sent to Dr Gaynelle Arabian

## 2015-08-29 NOTE — Telephone Encounter (Signed)
Echo should be able to be done by that time

## 2015-08-29 NOTE — Progress Notes (Addendum)
Spoke with Dr E Fitzgerald/anesthesia in regards to pts ECHO/aneurysm noted. Dr  Ola Spurr wanted pt to be rechecked prior to surgery date due to size of aneurysm noted in 2014. This nurse contacted [place] with [facility] in regards to request.

## 2015-08-29 NOTE — Progress Notes (Signed)
Your patient has screened at an elevated risk for Obstructive Sleep Apnea using the Stop-Bang Tool during a pre-surgical visit. Patient scored at high risk.  

## 2015-08-29 NOTE — Progress Notes (Signed)
CXR epic 10/12/2014 EKG epic 10/15/2014 ECHO epic 04/28/2013

## 2015-08-30 ENCOUNTER — Ambulatory Visit (INDEPENDENT_AMBULATORY_CARE_PROVIDER_SITE_OTHER): Payer: Medicare Other | Admitting: Internal Medicine

## 2015-08-30 ENCOUNTER — Encounter: Payer: Self-pay | Admitting: Internal Medicine

## 2015-08-30 VITALS — BP 138/88 | HR 76 | Temp 98.2°F | Wt 232.0 lb

## 2015-08-30 DIAGNOSIS — R7301 Impaired fasting glucose: Secondary | ICD-10-CM | POA: Diagnosis not present

## 2015-08-30 DIAGNOSIS — I712 Thoracic aortic aneurysm, without rupture, unspecified: Secondary | ICD-10-CM

## 2015-08-30 DIAGNOSIS — Z01810 Encounter for preprocedural cardiovascular examination: Secondary | ICD-10-CM | POA: Diagnosis not present

## 2015-08-30 LAB — HEMOGLOBIN A1C: Hgb A1c MFr Bld: 5.4 % (ref 4.6–6.5)
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
## 2015-08-30 NOTE — Progress Notes (Signed)
Pre visit review using our clinic review tool, if applicable. No additional management support is needed unless otherwise documented below in the visit note. 

## 2015-08-30 NOTE — Patient Instructions (Signed)
Thoracic Aortic Aneurysm An aneurysm is a bulge in an artery. It happens when the wall of the artery is weakened or damaged. If the aneurysm gets too big, it bursts (ruptures) and severe bleeding occurs. A thoracic aortic aneurysm is an aneurysm that occurs in the first part of the aorta, between the heart and the diaphragm. The aorta is the main artery and supplies blood from the heart to the rest of the body. A thoracic aortic aneurysm can enlarge and rupture or blood can flow between the layers of the wall of the aorta through a tear (aorticdissection). Both of these conditions can cause bleeding inside the body and can be life threatening unless diagnosed and treated promptly. CAUSES  The exact cause of a thoracic aortic aneurysm is often unknown. Some contributing factors are:   A hardening of the arteries caused by the buildup of fat and other substances in the lining of a blood vessel (arteriosclerosis).  Inflammation of the walls of an artery (arteritis).  Connective tissue diseases, such as Marfan syndrome.  Injury or trauma to the aorta.  An infection, such as syphilis or staphylococcus, in the wall of the aorta (infectious aortitis) caused by bacteria. RISK FACTORS  Risk factors that contribute to a thoracic aortic aneurysm may include:  Age older than 19 years.  High blood pressure (hypertension).  Male gender.  Ethnicity (white race).  Obesity.  Family history of aneurysm (first degree relatives only).  Tobacco use. PREVENTION  The following healthy lifestyle habits may help decrease your risk of a thoracic aortic aneurysm:  Quitting smoking. Smoking can raise your blood pressure and cause arteriosclerosis.  Limiting or avoiding alcohol.  Keeping your blood pressure, blood sugar level, and cholesterol levels within normal limits.  Decreasing your salt intake. In some people, too much salt can raise blood pressure and increase your risk of abdominal aortic  aneurysm.  Eating a diet low in saturated fats and cholesterol.  Increasing your fiber intake by including whole grains, vegetables, and fruits in your diet. Eating these foods may help lower blood pressure.  Maintaining a healthy weight.  Staying physically active and exercising regularly. SYMPTOMS  The symptoms of thoracic aortic aneurysm may vary depending on the size and rate of growth of the aneurysm. Most grow slowly and do not have any symptoms. When symptoms do occur, they may include:  Pain (chest, back, sides, or abdomen). The pain may vary in intensity. A sudden onset of severe pain may indicate that the aneurysm has ruptured.  Hoarseness.  Cough.  Shortness of breath.  Swallowing problems.  Nausea or vomiting or both. DIAGNOSIS  Since most unruptured thoracic aortic aneurysms have no symptoms, they are often discovered during diagnostic exams for other conditions. An aneurysm may be found during the following procedures:  Ultrasonography (a one-time screening for thoracic aortic aneurysm by ultrasonography is also recommended for all men aged 63-75 years who have ever smoked).  X-ray exams.  A CT scan.  An MRI.  Angiography or arteriography. TREATMENT  Treatment of a thoracic aortic aneurysm depends on the size of your aneurysm, your age, and risk factors for rupture. Medicine to control blood pressure and pain may be used to manage aneurysms smaller than 2.3 in (6 cm). Regular monitoring for enlargement may be recommended by your health care provider if:  The aneurysm is 1.2-1.5 in (3-4 cm) in size (an annual ultrasonography may be recommended).  The aneurysm is 1.5-1.8 in (4-4.5 cm) in size (an ultrasonography every 6  months may be recommended).  The aneurysm is larger than 1.8 in (4.5 cm) in size (your health care provider may ask that you be examined by a vascular surgeon). If your aneurysm is larger than 2.2 in (5.5 cm) or if it is enlarging quickly,  surgical repair may be recommended. There are two main methods for repair of an aneurysm:   Endovascular repair (a minimally invasive surgery).  Open repair. This method is used if an endovascular repair is not possible.   This information is not intended to replace advice given to you by your health care provider. Make sure you discuss any questions you have with your health care provider.   Document Released: 05/26/2005 Document Revised: 03/16/2013 Document Reviewed: 12/06/2012 Elsevier Interactive Patient Education 2016 [employer]  

## 2015-09-04 ENCOUNTER — Ambulatory Visit (INDEPENDENT_AMBULATORY_CARE_PROVIDER_SITE_OTHER)
Admission: RE | Admit: 2015-09-04 | Discharge: 2015-09-04 | Disposition: A | Discharge status: Home / Self Care | Payer: Medicare Other | Source: Ambulatory Visit | Attending: Internal Medicine | Admitting: Internal Medicine

## 2015-09-04 DIAGNOSIS — I712 Thoracic aortic aneurysm, without rupture, unspecified: Secondary | ICD-10-CM

## 2015-09-04 MED ORDER — IOPAMIDOL (ISOVUE-370) INJECTION 76%
100.0000 mL | Freq: Once | INTRAVENOUS | Status: AC | PRN
  Administered 2015-09-04: 100 mL via INTRAVENOUS

## 2015-09-04 NOTE — Progress Notes (Signed)
CT Chest in epic 09/04/2015

## 2015-09-08 HISTORY — PX: CYSTOSCOPY WITH INSERTION OF UROLIFT: SHX6678

## 2015-09-09 NOTE — H&P (Signed)
Reason For Visit Cystoscopy   Active Problems Problems  1. Benign prostatic hyperplasia with urinary obstruction (N40.1,N13.8)   Assessed By: Carolan Clines (Urology); Last Assessed: 08 Aug 2015 2. Hypogonadism, testicular (E29.1)   Assessed By: Lowella Bandy (Urology); Last Assessed: 21 Aug 2010 3. Nocturia (R35.1)   Assessed By: Carolan Clines (Urology); Last Assessed: 18 Jul 2015 4. Overactive bladder (N32.81) 5. Peyronie's disease (N48.6)  History of Present Illness    65 yo disabled male ( shoulder) returns today for a cystoscopy for possible Urolift candidate. Hx of BPH, with IPSS= 21/7, and pvr = 52., peak flow 66m mean 6cc/sec, prostate volume 22cc. He had microwave thermotherapy before.     He is currently taking Tamsulosin 0.4mg  QD. He had a TUNA procedure in August 2008. His PSA was 2.89 in August 2015. Cystoscopy in June 2016 revealed moderate prostatic enlargement without significant bladder outlet obstruction.  Urodynamics showed a well sustained contraction but it was not a strong one. His flow was prolonged and interrupted. He voids better. He has a better flow.   Past Medical History Problems  1. History of Anxiety (F41.9) 2. History of arthritis (Z87.39) 3. History of depression (Z86.59) 4. History of esophageal reflux (Z87.19) 5. History of hypercholesterolemia (Z86.39) 6. History of hypertension (Z86.79)  Surgical History Problems  1. History of CABG 2. History of Neck Surgery 3. History of Repair Of Forearm 4. History of Shoulder Surgery 5. History of Wrist Surgery  Current Meds 1. Accu-Chek Compact STRP;  Therapy: (Recorded:03May2016) to Recorded 2. Aspirin 81 MG TABS;  Therapy: (Recorded:03May2016) to Recorded 3. Atorvastatin Calcium TABS;  Therapy: (Recorded:03May2016) to Recorded 4. Caltrate 600+D TABS;  Therapy: (Recorded:24Apr2008) to Recorded 5. Carvedilol 12.5 MG Oral Tablet;  Therapy: [id number] to Recorded 6. CloNIDine HCl  TABS;  Therapy: (Recorded:29Sep2009) to Recorded 7. Tamsulosin HCl - 0.4 MG Oral Capsule; TAKE 1 BY MOUTH AT BEDTIME;  Therapy: 21Jun2010 to (Evaluate:03Sep2017)  Requested for: 08Sep2016; Last  Rx:08Sep2016 Ordered 8. Valsartan-Hydrochlorothiazide 320-12.5 MG Oral Tablet;  Therapy: [id number] to Recorded 9. Vitamin C TABS;  Therapy: (Recorded:07Feb2013) to Recorded 10. Vitamin D TABS;   Therapy: (Recorded:07Feb2013) to Recorded  Allergies Medication  1. No Known Drug Allergies  Family History Problems  1. Family history of Alcoholism : Brother 2. Family history of Alzheimer Disease 3. Family history of Hypertension : Brother 4. Family history of Hypoglycemia : Brother  Social History Problems  1. Denied: History of Alcohol Use (History) 2. Caffeine Use 3. Family history of Death In The Family Father   45;  STROKE 4. Family history of Death In The Family Mother 34. Disabled 6. Marital History - Single 7. Never A Smoker 8. 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 Denied: History of Tobacco Use  Review of Systems Genitourinary, constitutional, skin, eye, otolaryngeal, hematologic/lymphatic, cardiovascular, pulmonary, endocrine, musculoskeletal, gastrointestinal, neurological and psychiatric system(s) were reviewed and pertinent findings if present are noted and are otherwise negative.  Genitourinary: urinary frequency, feelings of urinary urgency, nocturia, incomplete emptying of bladder and initiating urination requires straining.    Vitals Vital Signs [Data Includes: Last 1 Day]  Recorded: [id number] 11:26AM  Blood Pressure: 142 / 95 Temperature: 97.9 F Heart Rate: 92  Physical Exam Constitutional: Well nourished and well developed . No acute distress.  ENT:. The ears and nose are normal in appearance.  Neck: The appearance of the neck is normal and no neck mass is present.  Pulmonary: No respiratory distress and normal respiratory rhythm and effort.  Cardiovascular: Heart rate and rhythm are normal . No peripheral edema.  Abdomen: The abdomen is soft and nontender. No masses are palpated. No CVA tenderness. No hernias are palpable. No hepatosplenomegaly noted.  Rectal: Rectal exam demonstrates normal sphincter tone, the anus is normal on inspection., no tenderness and no masses. Estimated prostate size is 2+. Normal rectal tone, no rectal masses, prostate is smooth, symmetric and non-tender. The prostate has no nodularity, is not indurated, is not tender and is not fluctuant. The left seminal vesicle is nonpalpable. The right seminal vesicle is nonpalpable. The perineum is normal on inspection.  Genitourinary: Examination of the penis demonstrates no discharge, no masses, no lesions and a normal meatus. The scrotum is without lesions. The right epididymis is palpably normal and non-tender. The left epididymis is palpably normal and non-tender. The right testis is non-tender and without masses. The left testis is non-tender and without masses.  Lymphatics: The femoral and inguinal nodes are not enlarged or tender.  Skin: Normal skin turgor, no visible rash and no visible skin lesions.  Neuro/Psych:. Mood and affect are appropriate.    Results/Data Urine [Data Includes: Last 1 Day]   [id number]  COLOR YELLOW   APPEARANCE CLEAR   SPECIFIC GRAVITY 1.015   pH 5.0   GLUCOSE NEGATIVE   BILIRUBIN NEGATIVE   KETONE NEGATIVE   BLOOD NEGATIVE   PROTEIN NEGATIVE   NITRITE NEGATIVE   LEUKOCYTE ESTERASE NEGATIVE    Flow Rate: Voided 316 ml. A peak flow rate of 107ml/s and mean flow rate of 37ml/s.    Procedure  Procedure: Cystoscopy  Chaperone Present: Gregory Fisher.  Indication: Lower Urinary Tract Symptoms.  Informed Consent: Risks, benefits, and potential adverse events were discussed and informed consent was obtained from the patient.  Prep: The patient was prepped with betadine.  Anesthesia:. Local anesthesia was administered intraurethrally  with 2% lidocaine jelly.  Antibiotic prophylaxis: Ciprofloxacin.  Procedure Note:  Urethral meatus:. No abnormalities.  Anterior urethra: No abnormalities.  Prostatic urethra: No abnormalities . The lateral prostatic lobes were enlarged. No intravesical median lobe was visualized.  Bladder: Visulization was clear. The ureteral orifices were in the normal anatomic position bilaterally and had clear efflux of urine. A systematic survey of the bladder demonstrated no bladder tumors or stones. Examination of the bladder demonstrated no trabeculation no cellules. The patient tolerated the procedure well.  Complications: None.    Assessment Assessed  1. Benign prostatic hyperplasia with urinary obstruction (N40.1,N13.8)  Gregory Fisher is a 65 yo male with BPH and IPSS= 21, post microwave thermotherapy 9 years ago. He did lose some ejaculate volume after microwave thermotherapy. he now needs additional Rx, and is a good candidate for Urolift, with 22 cc prostate, pvr 52cc.   Plan  Benign prostatic hyperplasia with urinary obstruction  1. Follow-up Schedule Surgery Office  Follow-up  Status: Hold For - Appointment   Requested for: [id number] 2. Complex Uroflowmetry; Status:Complete;   [id number] Benign prostatic hyperplasia with urinary obstruction, Nocturia, Overactive bladder  3. Cysto; Status:Complete;   [id number]  Urolift. PROSTATE U/S; Status:Resulted - Requires Verification;  Done: [id number] 12:00AM  [id number]; Marked Important;Ordered; Today;   [id number] prostatic hyperplasia with urinary obstruction; Ordered [id number], Kieli Golladay;   Amendment  Prostate ultrasound shows length 3.65 cm; height 2.36 cm; with 4.98 cm the mucosa volume 22.40 cm.1     1 Amended By: Carolan Clines; Sep 03 2015 10:49 AM EST  Signatures Electronically signed by : Carolan Clines, M.D.; Sep 03 2015 10:50AM EST

## 2015-09-10 ENCOUNTER — Ambulatory Visit (HOSPITAL_COMMUNITY): Payer: Medicare Other | Admitting: Anesthesiology

## 2015-09-10 ENCOUNTER — Encounter (HOSPITAL_COMMUNITY): Payer: Self-pay | Admitting: *Deleted

## 2015-09-10 ENCOUNTER — Ambulatory Visit (HOSPITAL_COMMUNITY)
Admission: RE | Admit: 2015-09-10 | Discharge: 2015-09-10 | Disposition: A | Discharge status: Home / Self Care | Payer: Medicare Other | Source: Ambulatory Visit | Attending: Urology | Admitting: Urology

## 2015-09-10 ENCOUNTER — Encounter (HOSPITAL_COMMUNITY)
Admission: RE | Disposition: A | Discharge status: Home / Self Care | Payer: Self-pay | Source: Ambulatory Visit | Attending: Urology

## 2015-09-10 DIAGNOSIS — R3914 Feeling of incomplete bladder emptying: Secondary | ICD-10-CM | POA: Diagnosis not present

## 2015-09-10 DIAGNOSIS — Z951 Presence of aortocoronary bypass graft: Secondary | ICD-10-CM | POA: Insufficient documentation

## 2015-09-10 DIAGNOSIS — Z7982 Long term (current) use of aspirin: Secondary | ICD-10-CM | POA: Insufficient documentation

## 2015-09-10 DIAGNOSIS — R3915 Urgency of urination: Secondary | ICD-10-CM | POA: Diagnosis not present

## 2015-09-10 DIAGNOSIS — I739 Peripheral vascular disease, unspecified: Secondary | ICD-10-CM | POA: Diagnosis not present

## 2015-09-10 DIAGNOSIS — Z87891 Personal history of nicotine dependence: Secondary | ICD-10-CM | POA: Insufficient documentation

## 2015-09-10 DIAGNOSIS — Z79899 Other long term (current) drug therapy: Secondary | ICD-10-CM | POA: Diagnosis not present

## 2015-09-10 DIAGNOSIS — R3916 Straining to void: Secondary | ICD-10-CM | POA: Diagnosis not present

## 2015-09-10 DIAGNOSIS — N401 Enlarged prostate with lower urinary tract symptoms: Secondary | ICD-10-CM | POA: Insufficient documentation

## 2015-09-10 DIAGNOSIS — N486 Induration penis plastica: Secondary | ICD-10-CM | POA: Diagnosis not present

## 2015-09-10 DIAGNOSIS — N138 Other obstructive and reflux uropathy: Secondary | ICD-10-CM | POA: Insufficient documentation

## 2015-09-10 DIAGNOSIS — R350 Frequency of micturition: Secondary | ICD-10-CM | POA: Insufficient documentation

## 2015-09-10 DIAGNOSIS — R351 Nocturia: Secondary | ICD-10-CM | POA: Diagnosis not present

## 2015-09-10 DIAGNOSIS — M1990 Unspecified osteoarthritis, unspecified site: Secondary | ICD-10-CM | POA: Diagnosis not present

## 2015-09-10 DIAGNOSIS — I10 Essential (primary) hypertension: Secondary | ICD-10-CM | POA: Diagnosis not present

## 2015-09-10 DIAGNOSIS — E7800 Pure hypercholesterolemia, unspecified: Secondary | ICD-10-CM | POA: Diagnosis not present

## 2015-09-10 DIAGNOSIS — N400 Enlarged prostate without lower urinary tract symptoms: Secondary | ICD-10-CM

## 2015-09-10 HISTORY — PX: CYSTOSCOPY WITH INSERTION OF UROLIFT: SHX6678

## 2015-09-10 SURGERY — CYSTOSCOPY WITH INSERTION OF UROLIFT
Anesthesia: General

## 2015-09-10 MED ORDER — TRAMADOL-ACETAMINOPHEN 37.5-325 MG PO TABS: 1.0000 | ORAL_TABLET | Freq: Four times a day (QID) | ORAL | Status: DC | PRN

## 2015-09-10 MED ORDER — LACTATED RINGERS IV SOLN
INTRAVENOUS | Status: DC | PRN
  Administered 2015-09-10: 08:00:00 via INTRAVENOUS

## 2015-09-10 MED ORDER — PROPOFOL 10 MG/ML IV BOLUS
INTRAVENOUS | Status: AC
  Filled 2015-09-10: qty 20

## 2015-09-10 MED ORDER — ONDANSETRON HCL 4 MG/2ML IJ SOLN
INTRAMUSCULAR | Status: AC
  Filled 2015-09-10: qty 2

## 2015-09-10 MED ORDER — PHENYLEPHRINE HCL 10 MG/ML IJ SOLN
INTRAMUSCULAR | Status: DC | PRN
  Administered 2015-09-10: 40 ug via INTRAVENOUS

## 2015-09-10 MED ORDER — FENTANYL CITRATE (PF) 100 MCG/2ML IJ SOLN
INTRAMUSCULAR | Status: AC
  Filled 2015-09-10: qty 2

## 2015-09-10 MED ORDER — LIDOCAINE HCL (CARDIAC) 20 MG/ML IV SOLN
INTRAVENOUS | Status: AC
  Filled 2015-09-10: qty 5

## 2015-09-10 MED ORDER — TRIMETHOPRIM 100 MG PO TABS: 200.0000 mg | ORAL_TABLET | Freq: Every day | ORAL | Status: DC

## 2015-09-10 MED ORDER — DEXTROSE 5 % IV SOLN
2.0000 g | INTRAVENOUS | Status: AC
  Administered 2015-09-10: 2 g via INTRAVENOUS
  Filled 2015-09-10: qty 20

## 2015-09-10 MED ORDER — FENTANYL CITRATE (PF) 100 MCG/2ML IJ SOLN
25.0000 ug | INTRAMUSCULAR | Status: DC | PRN
  Administered 2015-09-10 (×2): 25 ug via INTRAVENOUS
  Administered 2015-09-10: 50 ug via INTRAVENOUS

## 2015-09-10 MED ORDER — PROPOFOL 10 MG/ML IV BOLUS
INTRAVENOUS | Status: DC | PRN
  Administered 2015-09-10 (×2): 100 mg via INTRAVENOUS

## 2015-09-10 MED ORDER — SODIUM CHLORIDE 0.9 % IR SOLN
Status: DC | PRN
  Administered 2015-09-10: 3000 mL

## 2015-09-10 MED ORDER — HYOSCYAMINE SULFATE 0.125 MG SL SUBL: 0.1250 mg | SUBLINGUAL_TABLET | Freq: Four times a day (QID) | SUBLINGUAL | Status: DC | PRN

## 2015-09-10 MED ORDER — MELOXICAM 15 MG PO TABS: 15.0000 mg | ORAL_TABLET | Freq: Every day | ORAL | Status: DC

## 2015-09-10 MED ORDER — FENTANYL CITRATE (PF) 250 MCG/5ML IJ SOLN
INTRAMUSCULAR | Status: DC | PRN
  Administered 2015-09-10 (×2): 25 ug via INTRAVENOUS
  Administered 2015-09-10: 50 ug via INTRAVENOUS

## 2015-09-10 MED ORDER — CEFAZOLIN SODIUM-DEXTROSE 2-3 GM-% IV SOLR
INTRAVENOUS | Status: AC
  Filled 2015-09-10: qty 50

## 2015-09-10 MED ORDER — ONDANSETRON HCL 4 MG/2ML IJ SOLN
INTRAMUSCULAR | Status: DC | PRN
  Administered 2015-09-10: 4 mg via INTRAVENOUS

## 2015-09-10 MED ORDER — LIDOCAINE HCL (CARDIAC) 20 MG/ML IV SOLN
INTRAVENOUS | Status: DC | PRN
  Administered 2015-09-10: 80 mg via INTRATRACHEAL

## 2015-09-10 MED ORDER — PHENAZOPYRIDINE HCL 200 MG PO TABS: 200.0000 mg | ORAL_TABLET | Freq: Three times a day (TID) | ORAL | Status: DC | PRN

## 2015-09-10 MED ORDER — TRAMADOL-ACETAMINOPHEN 37.5-325 MG PO TABS
1.0000 | ORAL_TABLET | Freq: Once | ORAL | Status: AC
  Administered 2015-09-10: 1 via ORAL

## 2015-09-10 MED ORDER — PHENYLEPHRINE 40 MCG/ML (10ML) SYRINGE FOR IV PUSH (FOR BLOOD PRESSURE SUPPORT)
PREFILLED_SYRINGE | INTRAVENOUS | Status: AC
  Filled 2015-09-10: qty 10

## 2015-09-10 MED ORDER — PHENAZOPYRIDINE HCL 200 MG PO TABS
200.0000 mg | ORAL_TABLET | Freq: Once | ORAL | Status: AC
  Administered 2015-09-10: 200 mg via ORAL
  Filled 2015-09-10: qty 1

## 2015-09-10 SURGICAL SUPPLY — 13 items
BAG URINE DRAINAGE (UROLOGICAL SUPPLIES) ×3 IMPLANT
BAG URO CATCHER STRL LF (MISCELLANEOUS) ×3 IMPLANT
CATH FOLEY 2WAY SLVR  5CC 16FR (CATHETERS) ×2
CATH FOLEY 2WAY SLVR 5CC 16FR (CATHETERS) ×1 IMPLANT
GLOVE BIOGEL M STRL SZ7.5 (GLOVE) ×3 IMPLANT
GLOVE BIOGEL PI IND STRL 6.5 (GLOVE) ×2 IMPLANT
GLOVE BIOGEL PI INDICATOR 6.5 (GLOVE) ×4
GOWN STRL REUS W/TWL XL LVL3 (GOWN DISPOSABLE) ×6 IMPLANT
MANIFOLD NEPTUNE II (INSTRUMENTS) ×3 IMPLANT
PACK CYSTO (CUSTOM PROCEDURE TRAY) ×3 IMPLANT
SYSTEM UROLIFT (Male Continence) ×9 IMPLANT
TUBING CONNECTING 10 (TUBING) ×2 IMPLANT
TUBING CONNECTING 10' (TUBING) ×1

## 2015-09-10 NOTE — Op Note (Signed)
Pre-operative diagnosis :  BPH  Postoperative diagnosis:  Same  Operation:  Implantation of three ( 3) Urolift Prostatic Implants  Surgeon:  S. Gaynelle Arabian, MD  First assistant:  None  Anesthesia: General LMA  Preparation:  After appropriate preanesthesia, the patient was brought the operating, placed on the operating table in the dorsal supine position where general LMA anesthesia was introduced. Using a place in the dorsal lithotomy position with the pubis was prepped with Betadine solution and draped in the usual fashion. The arm and was double checked. The history was reviewed as below.  Review history:  Benign prostatic hyperplasia with urinary obstruction (N40.1,N13.8)  Assessed By: Carolan Clines (Urology); Last Assessed: 08 Aug 2015 2. Hypogonadism, testicular (E29.1)  Assessed By: Lowella Bandy (Urology); Last Assessed: 21 Aug 2010 3. Nocturia (R35.1)  Assessed By: Carolan Clines (Urology); Last Assessed: 18 Jul 2015 4. Overactive bladder (N32.81) 5. Peyronie's disease (N48.6)  History of Present Illness   65 yo disabled male ( shoulder) returns today for a cystoscopy for possible Urolift candidate. Hx of BPH, with IPSS= 21/7, and pvr = 52., peak flow 29m mean 6cc/sec, prostate volume 22cc. He had microwave thermotherapy before.    He is currently taking Tamsulosin 0.4mg  QD. He had a TUNA procedure in August 2008. His PSA was 2.89 in August 2015. Cystoscopy in June 2016 revealed moderate prostatic enlargement without significant bladder outlet obstruction. Urodynamics showed a well sustained contraction but it was not a strong one. His flow was prolonged and interrupted. He voids better. He has a better flow.    Statement of  Likelihood of Success: Excellent. TIME-OUT observed.:  Procedure:  Cystourethroscopy was accomplished, showing bilobar BPH. Trabeculation was noted within the bladder. Early cellule formation was noted. There was no diverticular  formation. There was no stone or tumor identified. Clear reflux was seen from both ureteral orifices, which were located in normal position on the trigone position. Urolift  Implants were then placed first on the left side, 2 cm inside the bladder neck, at the 2:00 position, and inside the right bladder neck, at the 10:00 position. A third implant was necessary inside the Vero at the 3:00 position. Following implantation of 3 sutures, repeat cystoscopy showed the bladder neck to be open. No bleeding was noted. The patient tolerated procedure well. The bladder is drained of fluid. The patient had Foley catheter, 40 [place], place, with 10 mL of water in the balloon. He was awakened, taken to recovery room in good condition.

## 2015-09-10 NOTE — Interval H&P Note (Signed)
History and Physical Interval Note:  09/10/2015 8:56 AM  Gregory Fisher  has presented today for surgery, with the diagnosis of BENIGN PROSTATIC HYPERPLASIA  The various methods of treatment have been discussed with the patient and family. After consideration of risks, benefits and other options for treatment, the patient has consented to  Procedure(s): CYSTOSCOPY WITH INSERTION OF UROLIFT (N/A) as a surgical intervention .  The patient's history has been reviewed, patient examined, no change in status, stable for surgery.  I have reviewed the patient's chart and labs.  Questions were answered to the patient's satisfaction.     Myndi Wamble I Shon Indelicato

## 2015-09-10 NOTE — Anesthesia Postprocedure Evaluation (Signed)
Anesthesia Post Note  Patient: Gregory Fisher  Procedure(s) Performed: Procedure(s) (LRB): CYSTOSCOPY WITH INSERTION OF UROLIFT x 3 (N/A)  Patient location during evaluation: PACU Anesthesia Type: General Level of consciousness: sedated and patient cooperative Pain management: pain level controlled Vital Signs Assessment: post-procedure vital signs reviewed and stable Respiratory status: spontaneous breathing Cardiovascular status: stable Anesthetic complications: no    Last Vitals:  Filed Vitals:   09/10/15 1022 09/10/15 1152  BP: 139/86 144/90  Pulse: 69 76  Temp: 36.3 C 36.5 C  Resp: 16 16    Last Pain:  Filed Vitals:   09/10/15 1226  PainSc: [place]

## 2015-09-10 NOTE — Anesthesia Procedure Notes (Signed)
Procedure Name: LMA Insertion Date/Time: 09/10/2015 9:08 AM Performed by: Dione Booze Pre-anesthesia Checklist: Patient identified, Emergency Drugs available, Suction available and Patient being monitored Patient Re-evaluated:Patient Re-evaluated prior to inductionOxygen Delivery Method: Circle system utilized Preoxygenation: Pre-oxygenation with 100% oxygen Intubation Type: IV induction LMA: LMA inserted LMA Size: 5.0 Number of attempts: 2 Placement Confirmation: positive ETCO2 Tube secured with: Tape Dental Injury: Teeth and Oropharynx as per pre-operative assessment  Comments: Large air leak with LMA#4, changed to LMA#5.

## 2015-09-10 NOTE — Transfer of Care (Signed)
Immediate Anesthesia Transfer of Care Note  Patient: Gregory Fisher  Procedure(s) Performed: Procedure(s): CYSTOSCOPY WITH INSERTION OF UROLIFT x 3 (N/A)  Patient Location: PACU  Anesthesia Type:General  Level of Consciousness: awake, alert , oriented and patient cooperative  Airway & Oxygen Therapy: Patient Spontanous Breathing and Patient connected to face mask oxygen  Post-op Assessment: Report given to RN and Post -op Vital signs reviewed and stable  Post vital signs: Reviewed and stable  Last Vitals:  Filed Vitals:   09/10/15 0714  BP: 167/96  Pulse: 89  Temp: 36.6 C  Resp: 16    Complications: No apparent anesthesia complications

## 2015-09-10 NOTE — Anesthesia Preprocedure Evaluation (Addendum)
Anesthesia Evaluation  Patient identified by MRN, date of birth, ID band Patient awake    Reviewed: Allergy & Precautions, NPO status , Patient's Chart, lab work & pertinent test results, reviewed documented beta blocker date and time   History of Anesthesia Complications Negative for: history of anesthetic complications  Airway Mallampati: I  TM Distance: >3 FB Neck ROM: Full    Dental  (+) Edentulous Upper, Edentulous Lower   Pulmonary shortness of breath, former smoker,    Pulmonary exam normal        Cardiovascular hypertension, Pt. on home beta blockers and Pt. on medications + Peripheral Vascular Disease  Normal cardiovascular exam     Neuro/Psych PSYCHIATRIC DISORDERS negative neurological ROS     GI/Hepatic negative GI ROS, Neg liver ROS,   Endo/Other  negative endocrine ROS  Renal/GU negative Renal ROS     Musculoskeletal  (+) Arthritis ,   Abdominal   Peds  Hematology   Anesthesia Other Findings   Reproductive/Obstetrics                             Anesthesia Physical  Anesthesia Plan  ASA: III  Anesthesia Plan: General   Post-op Pain Management:    Induction: Intravenous  Airway Management Planned: LMA  Additional Equipment:   Intra-op Plan:   Post-operative Plan: Extubation in OR  Informed Consent: I have reviewed the patients History and Physical, chart, labs and discussed the procedure including the risks, benefits and alternatives for the proposed anesthesia with the patient or authorized representative who has indicated his/her understanding and acceptance.   Dental advisory given  Plan Discussed with: CRNA  Anesthesia Plan Comments:        Anesthesia Quick Evaluation

## 2015-09-10 NOTE — Discharge Instructions (Addendum)
Benign Prostatic Hyperplasia An enlarged prostate (benign prostatic hyperplasia) is common in older men. You may experience the following: 1. Weak urine stream. 2. Dribbling. 3. Feeling like the bladder has not emptied completely. 4. Difficulty starting urination. 5. Getting up frequently at night to urinate. 6. Urinating more frequently during the day. HOME CARE INSTRUCTIONS  Monitor your prostatic hyperplasia for any changes. The following actions may help to alleviate any discomfort you are experiencing: 1. Give yourself time when you urinate. 2. Stay away from alcohol. 3. Avoid beverages containing caffeine, such as coffee, tea, and colas, because they can make the problem worse. 4. Avoid decongestants, antihistamines, and some prescription medicines that can make the problem worse. 5. Follow up with your health care provider for further treatment as recommended. SEEK MEDICAL CARE IF: 1. You are experiencing progressive difficulty voiding. 2. Your urine stream is progressively getting narrower. 3. You are awaking from sleep with the urge to void more frequently. 4. You are constantly feeling the need to void. 5. You experience loss of urine, especially in small amounts. SEEK IMMEDIATE MEDICAL CARE IF:  1. You develop increased pain with urination or are unable to urinate. 2. You develop severe abdominal pain, vomiting, a high fever, or fainting. 3. You develop back pain or blood in your urine. MAKE SURE YOU:   Understand these instructions.  Will watch your condition.  Will get help right away if you are not doing well or get worse.   This information is not intended to replace advice given to you by your health care provider. Make sure you discuss any questions you have with your health care provider.   Document Released: 05/26/2005 Document Revised: 06/16/2014 Document Reviewed: 10/26/2012 Elsevier Interactive Patient Education [employer].   Call Maudie Mercury [phone]  for appointment for voiding trial.  General Anesthesia, Adult, Care After Refer to this sheet in the next few weeks. These instructions provide you with information on caring for yourself after your procedure. Your health care provider may also give you more specific instructions. Your treatment has been planned according to current medical practices, but problems sometimes occur. Call your health care provider if you have any problems or questions after your procedure. WHAT TO EXPECT AFTER THE PROCEDURE After the procedure, it is typical to experience: 7. Sleepiness. 8. Nausea and vomiting. HOME CARE INSTRUCTIONS 6. For the first 24 hours after general anesthesia: 1. Have a responsible person with you. 2. Do not drive a car. If you are alone, do not take public transportation. 3. Do not drink alcohol. 4. Do not take medicine that has not been prescribed by your health care provider. 5. Do not sign important papers or make important decisions. 6. You may resume a normal diet and activities as directed by your health care provider. 7. Change bandages (dressings) as directed. 8. If you have questions or problems that seem related to general anesthesia, call the hospital and ask for the anesthetist or anesthesiologist on call. SEEK MEDICAL CARE IF: 6. You have nausea and vomiting that continue the day after anesthesia. 7. You develop a rash. SEEK IMMEDIATE MEDICAL CARE IF:  4. You have difficulty breathing. 5. You have chest pain. 6. You have any allergic problems.   This information is not intended to replace advice given to you by your health care provider. Make sure you discuss any questions you have with your health care provider.   Document Released: 09/01/2000 Document Revised: 06/16/2014 Document Reviewed: 09/24/2011 Elsevier Interactive Patient Education  2016 [place], Adult A Foley catheter is a soft, flexible tube. This tube is placed into your  bladder to drain pee (urine). If you go home with this catheter in place, follow the instructions below. TAKING CARE OF THE CATHETER 9. Wash your hands with soap and water. 10. Put soap and water on a clean washcloth.  Clean the skin where the tube goes into your body.  Clean away from the tube site.  Never wipe toward the tube.  Clean the area using a circular motion.  Remove all the soap. Pat the area dry with a clean towel. For males, reposition the skin that covers the end of the penis (foreskin). 11. Attach the tube to your leg with tape or a leg strap. Do not stretch the tube tight. If you are using tape, remove any stickiness left behind by past tape you used. 12. Keep the drainage bag below your hips. Keep it off the floor. 13. Check your tube during the day. Make sure it is working and draining. Make sure the tube does not curl, twist, or bend. 14. Do not pull on the tube or try to take it out. TAKING CARE OF THE DRAINAGE BAGS You will have a large overnight drainage bag and a small leg bag. You may wear the overnight bag any time. Never wear the small bag at night. Follow the directions below. Emptying the Drainage Bag Empty your drainage bag when it is  - full or at least 2-3 times a day. 9. Wash your hands with soap and water. 10. Keep the drainage bag below your hips. 11. Hold the dirty bag over the toilet or clean container. 12. Open the pour spout at the bottom of the bag. Empty the pee into the toilet or container. Do not let the pour spout touch anything. 13. Clean the pour spout with a gauze pad or cotton ball that has rubbing alcohol on it. 14. Close the pour spout. 15. Attach the bag to your leg with tape or a leg strap. 16. Wash your hands well. Changing the Drainage Bag Change your bag once a month or sooner if it starts to smell or look dirty.  8. Wash your hands with soap and water. 9. Pinch the rubber tube so that pee does not spill out. 10. Disconnect the  catheter tube from the drainage tube at the connection valve. Do not let the tubes touch anything. 11. Clean the end of the catheter tube with an alcohol wipe. Clean the end of a the drainage tube with a different alcohol wipe. 12. Connect the catheter tube to the drainage tube of the clean drainage bag. 13. Attach the new bag to the leg with tape or a leg strap. Avoid attaching the new bag too tightly. 14. Wash your hands well. Cleaning the Drainage Bag 7. Wash your hands with soap and water. 8. Wash the bag in warm, soapy water. 9. Rinse the bag with warm water. 10. Fill the bag with a mixture of white vinegar and water (1 cup vinegar to 1 quart warm water [.2 liter vinegar to 1 liter warm water]). Close the bag and soak it for 30 minutes in the solution. 11. Rinse the bag with warm water. 12. Hang the bag to dry with the pour spout open and hanging downward. 13. Store the clean bag (once it is dry) in a clean plastic bag. 14. Wash your hands well. PREVENT INFECTION  Wash your hands before  and after touching your tube.  Take showers every day. Wash the skin where the tube enters your body. Do not take baths. Replace wet leg straps with dry ones, if this applies.  Do not use powders, sprays, or lotions on the genital area. Only use creams, lotions, or ointments as told by your doctor.  For females, wipe from front to back after going to the bathroom.  Drink enough fluids to keep your pee clear or pale yellow unless you are told not to have too much fluid (fluid restriction).  Do not let the drainage bag or tubing touch or lie on the floor.  Wear cotton underwear to keep the area dry. GET HELP IF:  Your pee is cloudy or smells unusually bad.  Your tube becomes clogged.  You are not draining pee into the bag or your bladder feels full.  Your tube starts to leak. GET HELP RIGHT AWAY IF:  You have pain, puffiness (swelling), redness, or yellowish-white fluid (pus) where the tube  enters the body.  You have pain in the belly (abdomen), legs, lower back, or bladder.  You have a fever.  You see blood fill the tube, or your pee is pink or red.  You feel sick to your stomach (nauseous), throw up (vomit), or have chills.  Your tube gets pulled out. MAKE SURE YOU:   Understand these instructions.  Will watch your condition.  Will get help right away if you are not doing well or get worse.   This information is not intended to replace advice given to you by your health care provider. Make sure you discuss any questions you have with your health care provider.   Document Released: 09/20/2012 Document Revised: 06/16/2014 Document Reviewed: 09/20/2012 Elsevier Interactive Patient Education 2016 [employer].  Call MD office to schedule removal of foley catheter this week.

## 2015-09-20 ENCOUNTER — Other Ambulatory Visit: Payer: Self-pay | Admitting: Family Medicine

## 2015-09-20 DIAGNOSIS — G5623 Lesion of ulnar nerve, bilateral upper limbs: Secondary | ICD-10-CM | POA: Diagnosis not present

## 2015-09-20 NOTE — Telephone Encounter (Signed)
plz phone in. 

## 2015-09-20 NOTE — Telephone Encounter (Signed)
Rx's called in as directed.  

## 2015-09-20 NOTE — Telephone Encounter (Signed)
Ok to refill 

## 2015-09-26 DIAGNOSIS — N138 Other obstructive and reflux uropathy: Secondary | ICD-10-CM | POA: Diagnosis not present

## 2015-09-26 DIAGNOSIS — Z0000 Encounter for general adult medical examination without abnormal findings: Secondary | ICD-10-CM | POA: Diagnosis not present

## 2015-09-26 DIAGNOSIS — N401 Enlarged prostate with lower urinary tract symptoms: Secondary | ICD-10-CM | POA: Diagnosis not present

## 2015-10-01 DIAGNOSIS — G5621 Lesion of ulnar nerve, right upper limb: Secondary | ICD-10-CM | POA: Diagnosis not present

## 2015-10-15 ENCOUNTER — Ambulatory Visit (INDEPENDENT_AMBULATORY_CARE_PROVIDER_SITE_OTHER): Payer: Medicare Other | Admitting: Family Medicine

## 2015-10-15 ENCOUNTER — Encounter: Payer: Self-pay | Admitting: Family Medicine

## 2015-10-15 VITALS — BP 124/82 | HR 76 | Temp 97.5°F | Wt 233.0 lb

## 2015-10-15 DIAGNOSIS — I10 Essential (primary) hypertension: Secondary | ICD-10-CM | POA: Diagnosis not present

## 2015-10-15 DIAGNOSIS — E785 Hyperlipidemia, unspecified: Secondary | ICD-10-CM | POA: Diagnosis not present

## 2015-10-15 DIAGNOSIS — N400 Enlarged prostate without lower urinary tract symptoms: Secondary | ICD-10-CM

## 2015-10-15 DIAGNOSIS — I712 Thoracic aortic aneurysm, without rupture, unspecified: Secondary | ICD-10-CM

## 2015-10-15 DIAGNOSIS — M8580 Other specified disorders of bone density and structure, unspecified site: Secondary | ICD-10-CM

## 2015-10-15 NOTE — Assessment & Plan Note (Signed)
Stable CTA 2017, 3.9cm diameter.

## 2015-10-15 NOTE — Progress Notes (Signed)
Pre visit review using our clinic review tool, if applicable. No additional management support is needed unless otherwise documented below in the visit note. 

## 2015-10-15 NOTE — Patient Instructions (Addendum)
Work on daily aspirin  Stop calcium tablet. Take vitamin D 1000 units daily over the counter. Get most of your calcium from the diet. Return as needed or in 6 months for medicare wellness visit Good to see you today
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
## 2015-10-15 NOTE — Assessment & Plan Note (Signed)
Declines rpt DEXA scan. Advised stop calcium due to CAD, start plain vit D 1000 IU daily.

## 2015-10-15 NOTE — Assessment & Plan Note (Signed)
S/p urolift procedure 2017.

## 2015-10-15 NOTE — Assessment & Plan Note (Signed)
Chronic, stable. Continue current regimen. 

## 2015-10-15 NOTE — Assessment & Plan Note (Signed)
Check FLP today. Continue lipitor. 

## 2015-10-17 DIAGNOSIS — N138 Other obstructive and reflux uropathy: Secondary | ICD-10-CM | POA: Diagnosis not present

## 2015-10-17 DIAGNOSIS — Z0000 Encounter for general adult medical examination without abnormal findings: Secondary | ICD-10-CM | POA: Diagnosis not present

## 2015-10-17 DIAGNOSIS — N401 Enlarged prostate with lower urinary tract symptoms: Secondary | ICD-10-CM | POA: Diagnosis not present

## 2015-10-26 ENCOUNTER — Other Ambulatory Visit: Payer: Self-pay | Admitting: Family Medicine

## 2015-10-26 NOTE — Telephone Encounter (Signed)
plz phone in. 

## 2015-10-26 NOTE — Telephone Encounter (Signed)
Rx called in to requested pharmacy 

## 2015-11-08 ENCOUNTER — Other Ambulatory Visit: Payer: Self-pay | Admitting: Family Medicine

## 2015-11-08 NOTE — Telephone Encounter (Signed)
plz phone in. 

## 2015-11-09 NOTE — Telephone Encounter (Signed)
Rx called in as directed.   

## 2015-11-28 DIAGNOSIS — N401 Enlarged prostate with lower urinary tract symptoms: Secondary | ICD-10-CM | POA: Diagnosis not present

## 2015-11-28 DIAGNOSIS — E291 Testicular hypofunction: Secondary | ICD-10-CM | POA: Diagnosis not present

## 2015-11-28 DIAGNOSIS — R351 Nocturia: Secondary | ICD-10-CM | POA: Diagnosis not present

## 2015-12-13 ENCOUNTER — Other Ambulatory Visit: Payer: Self-pay | Admitting: Family Medicine

## 2015-12-13 NOTE — Telephone Encounter (Signed)
Plz phone in

## 2015-12-13 NOTE — Telephone Encounter (Signed)
Rx called in as directed.   

## 2016-01-15 DIAGNOSIS — R3912 Poor urinary stream: Secondary | ICD-10-CM | POA: Diagnosis not present

## 2016-01-15 DIAGNOSIS — R3911 Hesitancy of micturition: Secondary | ICD-10-CM | POA: Diagnosis not present

## 2016-01-15 DIAGNOSIS — R350 Frequency of micturition: Secondary | ICD-10-CM | POA: Diagnosis not present

## 2016-01-15 DIAGNOSIS — N401 Enlarged prostate with lower urinary tract symptoms: Secondary | ICD-10-CM | POA: Diagnosis not present

## 2016-01-21 ENCOUNTER — Other Ambulatory Visit: Payer: Self-pay | Admitting: Family Medicine

## 2016-01-21 NOTE — Telephone Encounter (Signed)
Ok to refill? Last filled 12/13/15 #30 0RF

## 2016-01-22 NOTE — Telephone Encounter (Signed)
CALLED IN [facility]

## 2016-01-22 NOTE — Telephone Encounter (Signed)
plz phone in. 

## 2016-03-20 DIAGNOSIS — I10 Essential (primary) hypertension: Secondary | ICD-10-CM | POA: Diagnosis not present

## 2016-03-20 DIAGNOSIS — G5623 Lesion of ulnar nerve, bilateral upper limbs: Secondary | ICD-10-CM | POA: Diagnosis not present

## 2016-03-31 DIAGNOSIS — G5603 Carpal tunnel syndrome, bilateral upper limbs: Secondary | ICD-10-CM | POA: Diagnosis not present

## 2016-03-31 DIAGNOSIS — G5623 Lesion of ulnar nerve, bilateral upper limbs: Secondary | ICD-10-CM | POA: Diagnosis not present

## 2016-04-08 DIAGNOSIS — G5623 Lesion of ulnar nerve, bilateral upper limbs: Secondary | ICD-10-CM | POA: Diagnosis not present

## 2016-04-10 ENCOUNTER — Telehealth: Payer: Self-pay | Admitting: Family Medicine

## 2016-04-10 DIAGNOSIS — M79602 Pain in left arm: Secondary | ICD-10-CM

## 2016-04-10 NOTE — Telephone Encounter (Signed)
Pt needs a referral to a Copy.  He wants to go to  [facility], dr Leanora Cover.  He was not happy with the neurosurgeon.   cb number for pt is [phone] or cell [phone]

## 2016-04-11 ENCOUNTER — Other Ambulatory Visit: Payer: Self-pay | Admitting: Family Medicine

## 2016-04-11 NOTE — Telephone Encounter (Signed)
Ok to refill in Dr. Synthia Innocent absence? Last filled 11/08/15 #40 3RF.

## 2016-04-13 NOTE — Telephone Encounter (Signed)
Please call in.  Thanks.   

## 2016-04-14 NOTE — Telephone Encounter (Signed)
Tried to call pt. No answer on either number

## 2016-04-14 NOTE — Telephone Encounter (Signed)
Can he clarify what he would like referral for? Was it for his recent elbow surgeries this year for ulnar nerve release? Or was it for prior finger injury?

## 2016-04-14 NOTE — Telephone Encounter (Signed)
Medication phoned to pharmacy.  

## 2016-04-15 NOTE — Telephone Encounter (Signed)
Message left for patient to return my call.  

## 2016-04-17 NOTE — Telephone Encounter (Signed)
Spoke with patient. He said it because of the ulnar nerve release. He said that his arm is more numb than it was prior to surgery. 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

## 2016-04-17 NOTE — Telephone Encounter (Signed)
Noted  Referral placed.

## 2016-04-18 ENCOUNTER — Ambulatory Visit (INDEPENDENT_AMBULATORY_CARE_PROVIDER_SITE_OTHER): Payer: Medicare Other

## 2016-04-18 ENCOUNTER — Other Ambulatory Visit: Payer: Self-pay | Admitting: Family Medicine

## 2016-04-18 ENCOUNTER — Other Ambulatory Visit (INDEPENDENT_AMBULATORY_CARE_PROVIDER_SITE_OTHER): Payer: Medicare Other

## 2016-04-18 VITALS — BP 124/90 | HR 74 | Temp 98.4°F | Ht 66.5 in | Wt 236.0 lb

## 2016-04-18 DIAGNOSIS — Z23 Encounter for immunization: Secondary | ICD-10-CM | POA: Diagnosis not present

## 2016-04-18 DIAGNOSIS — E785 Hyperlipidemia, unspecified: Secondary | ICD-10-CM | POA: Diagnosis not present

## 2016-04-18 DIAGNOSIS — N400 Enlarged prostate without lower urinary tract symptoms: Secondary | ICD-10-CM

## 2016-04-18 DIAGNOSIS — E559 Vitamin D deficiency, unspecified: Secondary | ICD-10-CM

## 2016-04-18 DIAGNOSIS — Z0000 Encounter for general adult medical examination without abnormal findings: Secondary | ICD-10-CM | POA: Diagnosis not present

## 2016-04-18 DIAGNOSIS — Z114 Encounter for screening for human immunodeficiency virus [HIV]: Secondary | ICD-10-CM | POA: Diagnosis not present

## 2016-04-18 DIAGNOSIS — Z1159 Encounter for screening for other viral diseases: Secondary | ICD-10-CM | POA: Diagnosis not present

## 2016-04-18 LAB — COMPREHENSIVE METABOLIC PANEL
ALT: 14 U/L (ref 0–53)
AST: 14 U/L (ref 0–37)
Albumin: 4.2 g/dL (ref 3.5–5.2)
Alkaline Phosphatase: 80 U/L (ref 39–117)
BUN: 16 mg/dL (ref 6–23)
CALCIUM: 9.6 mg/dL (ref 8.4–10.5)
CHLORIDE: 104 meq/L (ref 96–112)
CO2: 30 meq/L (ref 19–32)
Creatinine, Ser: 1.01 mg/dL (ref 0.40–1.50)
GFR: 78.8 mL/min (ref >60.00)
Glucose, Bld: 112 mg/dL — ABNORMAL HIGH (ref 70–99)
POTASSIUM: 4.1 meq/L (ref 3.5–5.1)
Sodium: 142 mEq/L (ref 135–145)
Total Bilirubin: 0.7 mg/dL (ref 0.2–1.2)
Total Protein: 6.6 g/dL (ref 6.0–8.3)

## 2016-04-18 LAB — LIPID PANEL
CHOL/HDL RATIO: 3
Cholesterol: 143 mg/dL (ref 0–200)
HDL: 46.4 mg/dL (ref >39.00)
LDL CALC: 80 mg/dL (ref 0–99)
NONHDL: 96.58
Triglycerides: 84 mg/dL (ref 0.0–149.0)
VLDL: 16.8 mg/dL (ref 0.0–40.0)

## 2016-04-18 LAB — VITAMIN D 25 HYDROXY (VIT D DEFICIENCY, FRACTURES): VITD: 34.73 ng/mL (ref 30.00–100.00)

## 2016-04-18 LAB — PSA: PSA: 0.3 ng/mL (ref 0.10–4.00)

## 2016-04-18 NOTE — Progress Notes (Signed)
PCP notes:   Health maintenance:  Flu vaccine - administered Hep C screening - completed HIV screening - completed FOBT - kit provided Shingles - postponed/insurance  Abnormal screenings:   Hearing - failed Depression score: 5  Patient concerns:   Pt has concerns with insomnia and will discuss with PCP at next appt.  Nurse concerns:  None  Next PCP appt:   04/23/16 @ 0930

## 2016-04-18 NOTE — Patient Instructions (Signed)
Gregory Fisher , Thank you for taking time to come for your Medicare Wellness Visit. I appreciate your ongoing commitment to your health goals. Please review the following plan we discussed and let me know if I can assist you in the future.   These are the goals we discussed: Goals    . Increase water intake          Starting 04/18/16, I will continue to drink at least 8-10 glasses of water daily.        This is a list of the screening recommended for you and due dates:  Health Maintenance  Topic Date Due  . Shingles Vaccine  04/18/2017*  . Colon Cancer Screening  04/18/2026*  . Stool Blood Test  04/23/2016  . DTaP/Tdap/Td vaccine (2 - Td) 01/20/2023  . Tetanus Vaccine  01/20/2023  . Flu Shot  Completed  .  Hepatitis C: One time screening is recommended by Center for Disease Control  (CDC) for  adults born from [birth year] through 1965.   Completed  . HIV Screening  Completed  *Topic was postponed. The date shown is not the original due date.   Preventive Care for Adults  A healthy lifestyle and preventive care can promote health and wellness. Preventive health guidelines for adults include the following key practices.  . A routine yearly physical is a good way to check with your health care provider about your health and preventive screening. It is a chance to share any concerns and updates on your health and to receive a thorough exam.  . Visit your dentist for a routine exam and preventive care every 6 months. Brush your teeth twice a day and floss once a day. Good oral hygiene prevents tooth decay and gum disease.  . The frequency of eye exams is based on your age, health, family medical history, use  of contact lenses, and other factors. Follow your health care provider's ecommendations for frequency of eye exams.  . Eat a healthy diet. Foods like vegetables, fruits, whole grains, low-fat dairy products, and lean protein foods contain the nutrients you need without too many calories.  Decrease your intake of foods high in solid fats, added sugars, and salt. Eat the right amount of calories for you. Get information about a proper diet from your health care provider, if necessary.  . Regular physical exercise is one of the most important things you can do for your health. Most adults should get at least 150 minutes of moderate-intensity exercise (any activity that increases your heart rate and causes you to sweat) each week. In addition, most adults need muscle-strengthening exercises on 2 or more days a week.  Silver Sneakers may be a benefit available to you. To determine eligibility, you may visit the website: www.silversneakers.com or contact program at 208-793-1357 Mon-Fri between 8AM-8PM.   . Maintain a healthy weight. The body mass index (BMI) is a screening tool to identify possible weight problems. It provides an estimate of body fat based on height and weight. Your health care provider can find your BMI and can help you achieve or maintain a healthy weight.   For adults 20 years and older: ? A BMI below 18.5 is considered underweight. ? A BMI of 18.5 to 24.9 is normal. ? A BMI of 25 to 29.9 is considered overweight. ? A BMI of 30 and above is considered obese.   . Maintain normal blood lipids and cholesterol levels by exercising and minimizing your intake of saturated fat. Eat a  balanced diet with plenty of fruit and vegetables. Blood tests for lipids and cholesterol should begin at age 27 and be repeated every 5 years. If your lipid or cholesterol levels are high, you are over 50, or you are at high risk for heart disease, you may need your cholesterol levels checked more frequently. Ongoing high lipid and cholesterol levels should be treated with medicines if diet and exercise are not working.  . If you smoke, find out from your health care provider how to quit. If you do not use tobacco, please do not start.  . If you choose to drink alcohol, please do not consume  more than 2 drinks per day. One drink is considered to be 12 ounces (355 mL) of beer, 5 ounces (148 mL) of wine, or 1.5 ounces (44 mL) of liquor.  . If you are 52-59 years old, ask your health care provider if you should take aspirin to prevent strokes.  . Use sunscreen. Apply sunscreen liberally and repeatedly throughout the day. You should seek shade when your shadow is shorter than you. Protect yourself by wearing long sleeves, pants, a wide-brimmed hat, and sunglasses year round, whenever you are outdoors.  . Once a month, do a whole body skin exam, using a mirror to look at the skin on your back. Tell your health care provider of new moles, moles that have irregular borders, moles that are larger than a pencil eraser, or moles that have changed in shape or color.

## 2016-04-18 NOTE — Progress Notes (Signed)
I reviewed health advisor's note, was available for consultation, and agree with documentation and plan.  

## 2016-04-18 NOTE — Progress Notes (Signed)
Pre visit review using our clinic review tool, if applicable. No additional management support is needed unless otherwise documented below in the visit note. 
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
## 2016-04-19 LAB — HEPATITIS C ANTIBODY: HCV AB: NEGATIVE

## 2016-04-19 LAB — HIV ANTIBODY (ROUTINE TESTING W REFLEX): HIV: NONREACTIVE

## 2016-04-21 NOTE — Telephone Encounter (Signed)
Patient brought Dr Gearldine Bienenstock records over to Dr Tracie Harrier office for review for 2nd opinion. Faxed Dr Synthia Innocent last physical over also and waiting  To hear if Dr Fredna Dow will see him.

## 2016-04-23 ENCOUNTER — Ambulatory Visit (INDEPENDENT_AMBULATORY_CARE_PROVIDER_SITE_OTHER): Payer: Medicare Other | Admitting: Family Medicine

## 2016-04-23 ENCOUNTER — Encounter: Payer: Self-pay | Admitting: Family Medicine

## 2016-04-23 VITALS — BP 138/90 | HR 80 | Temp 97.9°F | Wt 236.0 lb

## 2016-04-23 DIAGNOSIS — Z23 Encounter for immunization: Secondary | ICD-10-CM | POA: Diagnosis not present

## 2016-04-23 DIAGNOSIS — R739 Hyperglycemia, unspecified: Secondary | ICD-10-CM

## 2016-04-23 DIAGNOSIS — M8580 Other specified disorders of bone density and structure, unspecified site: Secondary | ICD-10-CM

## 2016-04-23 DIAGNOSIS — I10 Essential (primary) hypertension: Secondary | ICD-10-CM

## 2016-04-23 DIAGNOSIS — N400 Enlarged prostate without lower urinary tract symptoms: Secondary | ICD-10-CM

## 2016-04-23 DIAGNOSIS — I6529 Occlusion and stenosis of unspecified carotid artery: Secondary | ICD-10-CM

## 2016-04-23 DIAGNOSIS — Z7189 Other specified counseling: Secondary | ICD-10-CM

## 2016-04-23 DIAGNOSIS — G4700 Insomnia, unspecified: Secondary | ICD-10-CM

## 2016-04-23 DIAGNOSIS — Z0000 Encounter for general adult medical examination without abnormal findings: Secondary | ICD-10-CM

## 2016-04-23 DIAGNOSIS — I712 Thoracic aortic aneurysm, without rupture, unspecified: Secondary | ICD-10-CM

## 2016-04-23 DIAGNOSIS — IMO0001 Reserved for inherently not codable concepts without codable children: Secondary | ICD-10-CM

## 2016-04-23 DIAGNOSIS — E559 Vitamin D deficiency, unspecified: Secondary | ICD-10-CM

## 2016-04-23 DIAGNOSIS — E785 Hyperlipidemia, unspecified: Secondary | ICD-10-CM

## 2016-04-23 NOTE — Addendum Note (Signed)
Addended by: Royann Shivers A on: 04/23/2016 10:40 AM   Modules accepted: Orders
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
## 2016-04-23 NOTE — Assessment & Plan Note (Signed)
Reports compliance with vit D 1000 IU daily.

## 2016-04-23 NOTE — Assessment & Plan Note (Signed)
Continue f/u with urology. Completed urolift procedure 2017.

## 2016-04-23 NOTE — Assessment & Plan Note (Signed)
Anticipate stemming from R hand pain - continue tramadol /ativan at this time.

## 2016-04-23 NOTE — Assessment & Plan Note (Signed)
Discussed healthy diet and lifestyle changes to affect sustainable weight loss  

## 2016-04-23 NOTE — Assessment & Plan Note (Signed)
Chronic, stable. Continue current regimen. 

## 2016-04-23 NOTE — Assessment & Plan Note (Signed)
Chronic, stable. Continue high dose lipitor.

## 2016-04-23 NOTE — Assessment & Plan Note (Signed)
Advanced directive: does not have set up.Would want James oldest brother to be HCPOA.Willwork onadvanced directive packet.  

## 2016-04-23 NOTE — Assessment & Plan Note (Signed)
Stable CTA 09/2015 

## 2016-04-23 NOTE — Assessment & Plan Note (Signed)
No bruit appreciated today.  

## 2016-04-23 NOTE — Progress Notes (Signed)
Pre visit review using our clinic review tool, if applicable. No additional management support is needed unless otherwise documented below in the visit note. 

## 2016-04-23 NOTE — Assessment & Plan Note (Signed)
Preventative protocols reviewed and updated unless pt declined. Discussed healthy diet and lifestyle.  

## 2016-04-23 NOTE — Assessment & Plan Note (Signed)
Continue vit D , encouraged more weight bearing exercises.

## 2016-04-23 NOTE — Assessment & Plan Note (Signed)
Reviewed healthy diet changes to maintain glycemic control. rec weight loss.

## 2016-04-23 NOTE — Patient Instructions (Addendum)
Happy birthday! prevnar today.  Call your insurance about the shingles shot to see if it is covered or how much it would cost and where is cheaper (here or pharmacy).  If you want to receive here, call for nurse visit.  Work on [employer].  I do recommend incorporating walking into routine. Goal activity is 113min/wk of moderate intensity exercise.  This can be split into 30 minute chunks.  If you are not at this level, you can start with smaller 10-15 min increments and slowly build up activity.  Health Maintenance, Male A healthy lifestyle and preventative care can promote health and wellness.  Maintain regular health, dental, and eye exams.  Eat a healthy diet. Foods like vegetables, fruits, whole grains, low-fat dairy products, and lean protein foods contain the nutrients you need and are low in calories. Decrease your intake of foods high in solid fats, added sugars, and salt. Get information about a proper diet from your health care provider, if necessary.  Regular physical exercise is one of the most important things you can do for your health. Most adults should get at least 150 minutes of moderate-intensity exercise (any activity that increases your heart rate and causes you to sweat) each week. In addition, most adults need muscle-strengthening exercises on 2 or more days a week.   Maintain a healthy weight. The body mass index (BMI) is a screening tool to identify possible weight problems. It provides an estimate of body fat based on height and weight. Your health care provider can find your BMI and can help you achieve or maintain a healthy weight. For males 20 years and older:  A BMI below 18.5 is considered underweight.  A BMI of 18.5 to 24.9 is normal.  A BMI of 25 to 29.9 is considered overweight.  A BMI of 30 and above is considered obese.  Maintain normal blood lipids and cholesterol by exercising and minimizing your intake of saturated fat. Eat a balanced  diet with plenty of fruits and vegetables. Blood tests for lipids and cholesterol should begin at age 70 and be repeated every 5 years. If your lipid or cholesterol levels are high, you are over age 53, or you are at high risk for heart disease, you may need your cholesterol levels checked more frequently.Ongoing high lipid and cholesterol levels should be treated with medicines if diet and exercise are not working.  If you smoke, find out from your health care provider how to quit. If you do not use tobacco, do not start.  Lung cancer screening is recommended for adults aged 82-80 years who are at high risk for developing lung cancer because of a history of smoking. A yearly low-dose CT scan of the lungs is recommended for people who have at least a 30-pack-year history of smoking and are current smokers or have quit within the past 15 years. A pack year of smoking is smoking an average of 1 pack of cigarettes a day for 1 year (for example, a 30-pack-year history of smoking could mean smoking 1 pack a day for 30 years or 2 packs a day for 15 years). Yearly screening should continue until the smoker has stopped smoking for at least 15 years. Yearly screening should be stopped for people who develop a health problem that would prevent them from having lung cancer treatment.  If you choose to drink alcohol, do not have more than 2 drinks per day. One drink is considered to be 12 oz (360 mL)  of beer, 5 oz (150 mL) of wine, or 1.5 oz (45 mL) of liquor.  Avoid the use of street drugs. Do not share needles with anyone. Ask for help if you need support or instructions about stopping the use of drugs.  High blood pressure causes heart disease and increases the risk of stroke. High blood pressure is more likely to develop in:  People who have blood pressure in the end of the normal range (100-139/85-89 mm Hg).  People who are overweight or obese.  People who are African American.  If you are 18-39 years of  age, have your blood pressure checked every 3-5 years. If you are 60 years of age or older, have your blood pressure checked every year. You should have your blood pressure measured twice-once when you are at a hospital or clinic, and once when you are not at a hospital or clinic. Record the average of the two measurements. To check your blood pressure when you are not at a hospital or clinic, you can use:  An automated blood pressure machine at a pharmacy.  A home blood pressure monitor.  If you are 48-53 years old, ask your health care provider if you should take aspirin to prevent heart disease.  Diabetes screening involves taking a blood sample to check your fasting blood sugar level. This should be done once every 3 years after age 89 if you are at a normal weight and without risk factors for diabetes. Testing should be considered at a younger age or be carried out more frequently if you are overweight and have at least 1 risk factor for diabetes.  Colorectal cancer can be detected and often prevented. Most routine colorectal cancer screening begins at the age of 24 and continues through age 19. However, your health care provider may recommend screening at an earlier age if you have risk factors for colon cancer. On a yearly basis, your health care provider may provide home test kits to check for hidden blood in the stool. A small camera at the end of a tube may be used to directly examine the colon (sigmoidoscopy or colonoscopy) to detect the earliest forms of colorectal cancer. Talk to your health care provider about this at age 23 when routine screening begins. A direct exam of the colon should be repeated every 5-10 years through age 58, unless early forms of precancerous polyps or small growths are found.  People who are at an increased risk for hepatitis B should be screened for this virus. You are considered at high risk for hepatitis B if:  You were born in a country where hepatitis B  occurs often. Talk with your health care provider about which countries are considered high risk.  Your parents were born in a high-risk country and you have not received a shot to protect against hepatitis B (hepatitis B vaccine).  You have HIV or AIDS.  You use needles to inject street drugs.  You live with, or have sex with, someone who has hepatitis B.  You are a man who has sex with other men (MSM).  You get hemodialysis treatment.  You take certain medicines for conditions like cancer, organ transplantation, and autoimmune conditions.  Hepatitis C blood testing is recommended for all people born from [birth year] through 1965 and any individual with known risk factors for hepatitis C.  Healthy men should no longer receive prostate-specific antigen (PSA) blood tests as part of routine cancer screening. Talk to your health care provider about  prostate cancer screening.  Testicular cancer screening is not recommended for adolescents or adult males who have no symptoms. Screening includes self-exam, a health care provider exam, and other screening tests. Consult with your health care provider about any symptoms you have or any concerns you have about testicular cancer.  Practice safe sex. Use condoms and avoid high-risk sexual practices to reduce the spread of sexually transmitted infections (STIs).  You should be screened for STIs, including gonorrhea and chlamydia if:  You are sexually active and are younger than 24 years.  You are older than 24 years, and your health care provider tells you that you are at risk for this type of infection.  Your sexual activity has changed since you were last screened, and you are at an increased risk for chlamydia or gonorrhea. Ask your health care provider if you are at risk.  If you are at risk of being infected with HIV, it is recommended that you take a prescription medicine daily to prevent HIV infection. This is called pre-exposure prophylaxis  (PrEP). You are considered at risk if:  You are a man who has sex with other men (MSM).  You are a heterosexual man who is sexually active with multiple partners.  You take drugs by injection.  You are sexually active with a partner who has HIV.  Talk with your health care provider about whether you are at high risk of being infected with HIV. If you choose to begin PrEP, you should first be tested for HIV. You should then be tested every 3 months for as long as you are taking PrEP.  Use sunscreen. Apply sunscreen liberally and repeatedly throughout the day. You should seek shade when your shadow is shorter than you. Protect yourself by wearing long sleeves, pants, a wide-brimmed hat, and sunglasses year round whenever you are outdoors.  Tell your health care provider of new moles or changes in moles, especially if there is a change in shape or color. Also, tell your health care provider if a mole is larger than the size of a pencil eraser.  A one-time screening for abdominal aortic aneurysm (AAA) and surgical repair of large AAAs by ultrasound is recommended for men aged 69-75 years who are current or former smokers.  Stay current with your vaccines (immunizations). This information is not intended to replace advice given to you by your health care provider. Make sure you discuss any questions you have with your health care provider. Document Released: 11/22/2007 Document Revised: 06/16/2014 Document Reviewed: 02/27/2015 Elsevier Interactive Patient Education  2017 [employer]. 0

## 2016-04-29 ENCOUNTER — Other Ambulatory Visit (INDEPENDENT_AMBULATORY_CARE_PROVIDER_SITE_OTHER): Payer: Medicare Other

## 2016-04-29 DIAGNOSIS — G5621 Lesion of ulnar nerve, right upper limb: Secondary | ICD-10-CM | POA: Diagnosis not present

## 2016-04-29 DIAGNOSIS — Z1211 Encounter for screening for malignant neoplasm of colon: Secondary | ICD-10-CM

## 2016-04-30 ENCOUNTER — Other Ambulatory Visit: Payer: Self-pay | Admitting: Family Medicine

## 2016-04-30 DIAGNOSIS — Z1211 Encounter for screening for malignant neoplasm of colon: Secondary | ICD-10-CM

## 2016-04-30 LAB — FECAL OCCULT BLOOD, IMMUNOCHEMICAL: FECAL OCCULT BLD: NEGATIVE

## 2016-05-06 DIAGNOSIS — M7541 Impingement syndrome of right shoulder: Secondary | ICD-10-CM | POA: Diagnosis not present

## 2016-05-12 ENCOUNTER — Other Ambulatory Visit: Payer: Self-pay | Admitting: Orthopedic Surgery

## 2016-05-23 ENCOUNTER — Other Ambulatory Visit: Payer: Self-pay | Admitting: Family Medicine

## 2016-05-26 NOTE — Telephone Encounter (Signed)
plz phone in. 

## 2016-05-26 NOTE — Telephone Encounter (Signed)
Ok to refill? Last filled 04/13/16 #40 0RF

## 2016-05-27 NOTE — Telephone Encounter (Signed)
Called in to [id number] - [place], [place] [address]: [phone]  

## 2016-06-09 IMAGING — CT CT ANGIO CHEST
2 of 7 series · 18 of 46 positions shown · IV contrast (ISOVUE 350)
Comparison: By report from prior echocardiography from 04/28/2013

CLINICAL DATA: Followup thoracic aortic aneurysm from prior
echocardiography in 1399

EXAM:
CT ANGIOGRAPHY CHEST WITH CONTRAST
TECHNIQUE: Multidetector CT imaging of the chest was performed using the
standard protocol during bolus administration of intravenous
contrast. Multiplanar CT image reconstructions and MIPs were
obtained to evaluate the vascular anatomy.
CONTRAST:  100 mL Isovue 370.

[Series 4: aorta 3.0 i31f 2 · axial · 0.77mm/px · z∈[-327,-63]mm · 15 of 96 slices shown]
[im 4/96  lung]
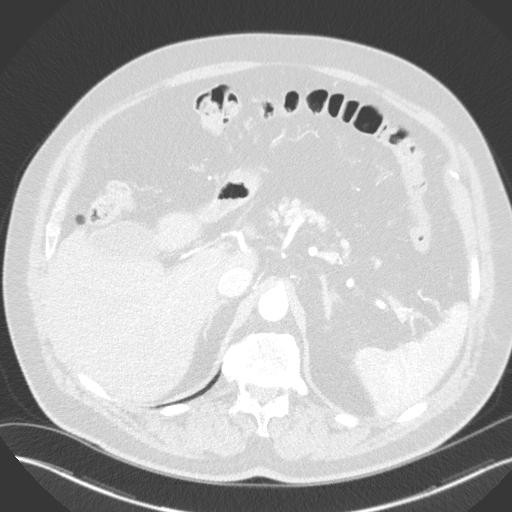
[im 11/96  soft-tissue]
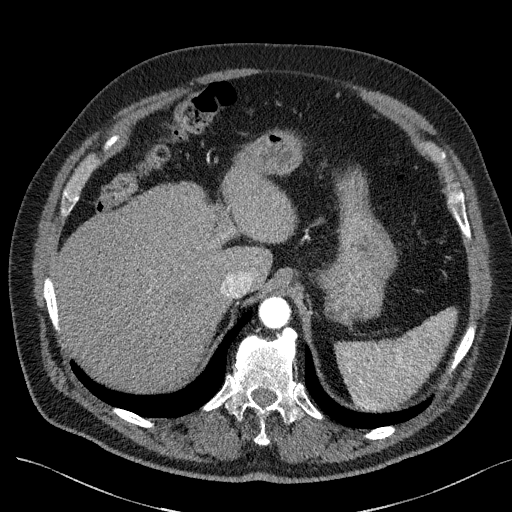
[im 18/96  lung]
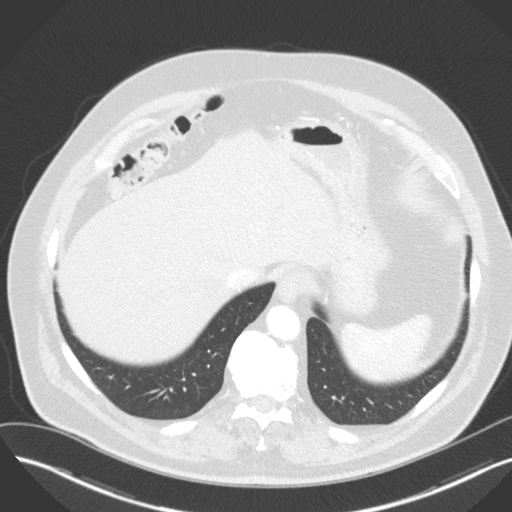
[im 25/96  soft-tissue]
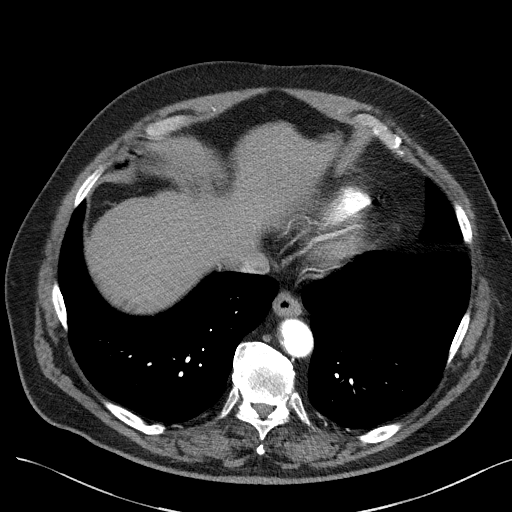
[im 29/96  lung]
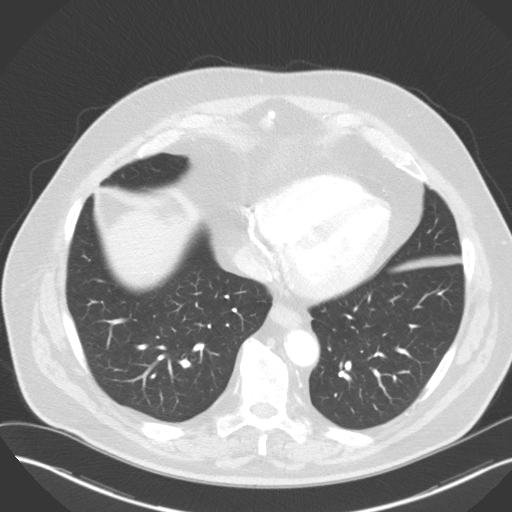
[im 36/96  soft-tissue]
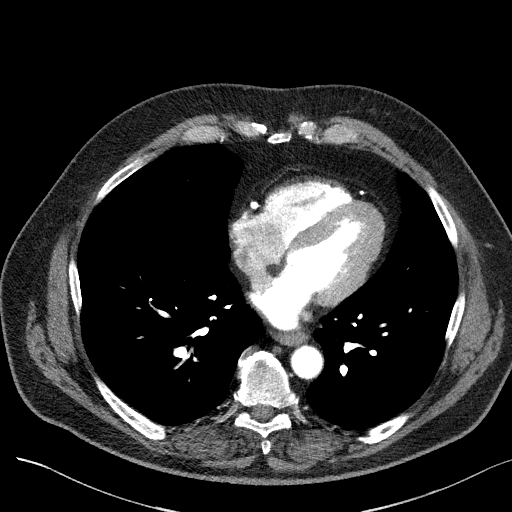
[im 43/96  lung]
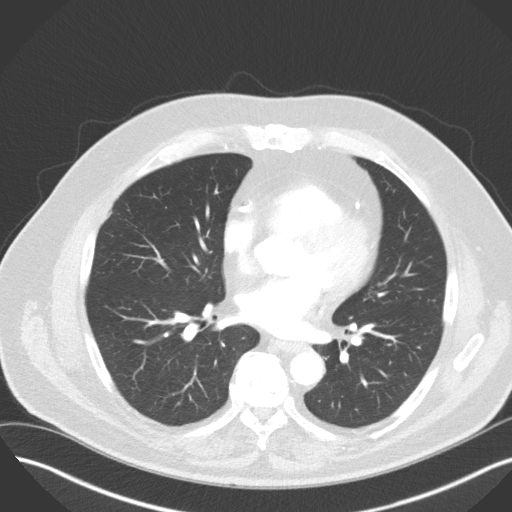
[im 50/96  soft-tissue]
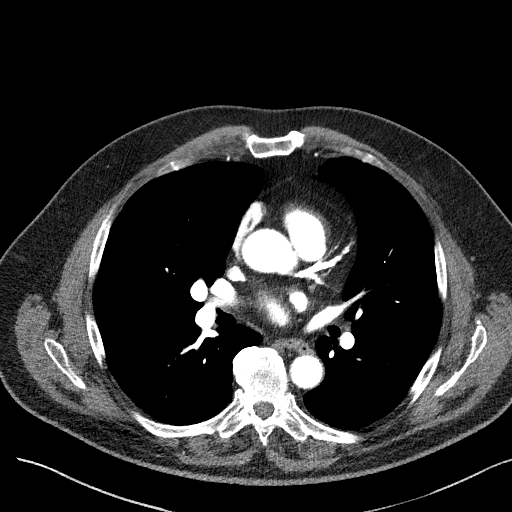
[im 53/96  lung]
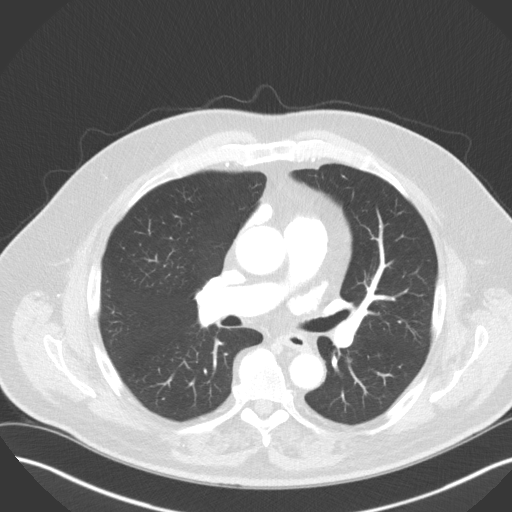
[im 60/96  soft-tissue]
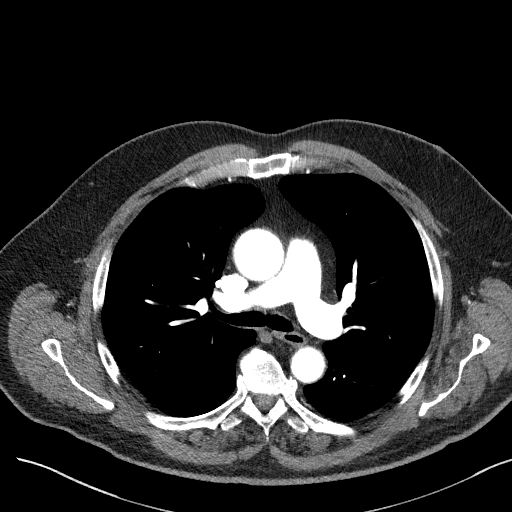
[im 67/96  lung]
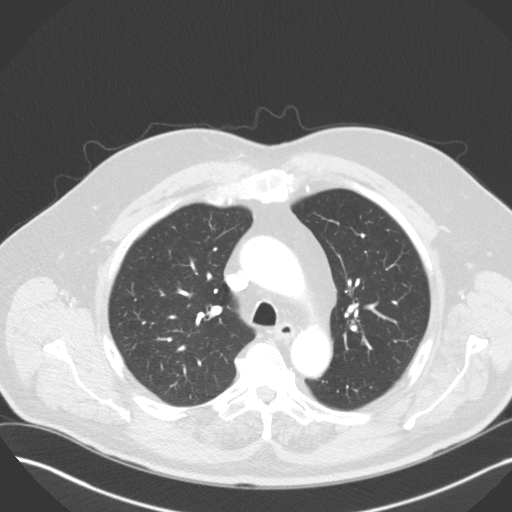
[im 71/96  soft-tissue]
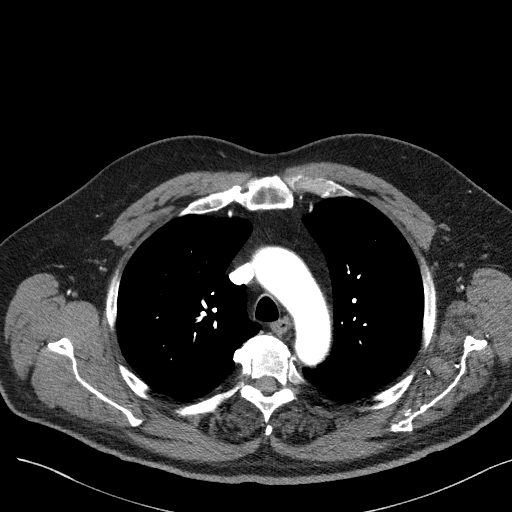
[im 78/96  lung]
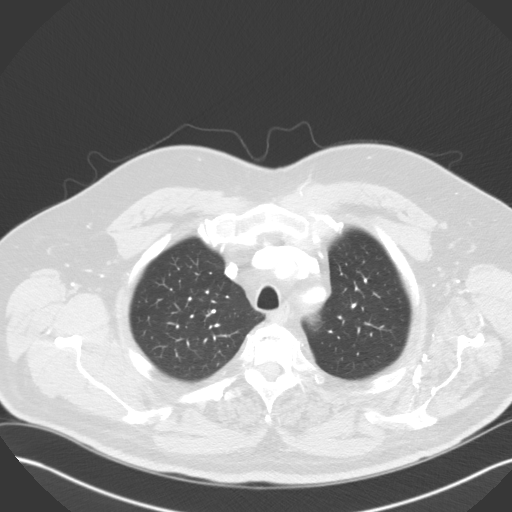
[im 85/96  soft-tissue]
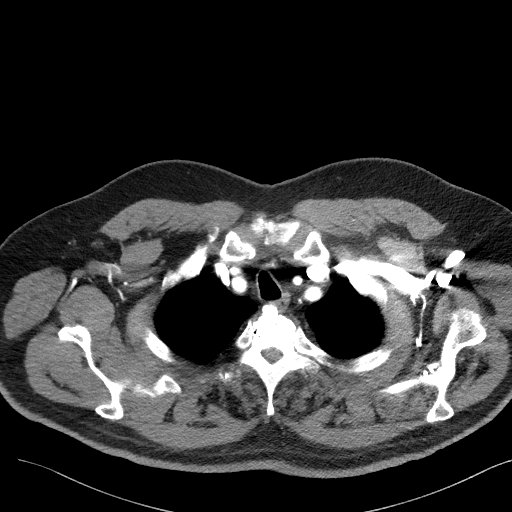
[im 92/96  lung]
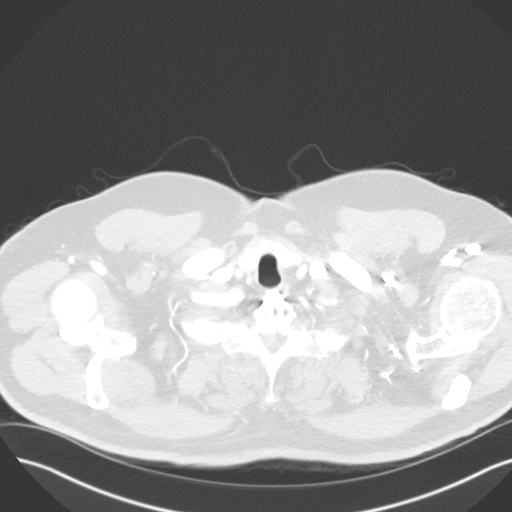

[Series 7: coronals · coronal · 0.66mm/px · 3 of 164 slices shown]
[im 41/164  soft-tissue]
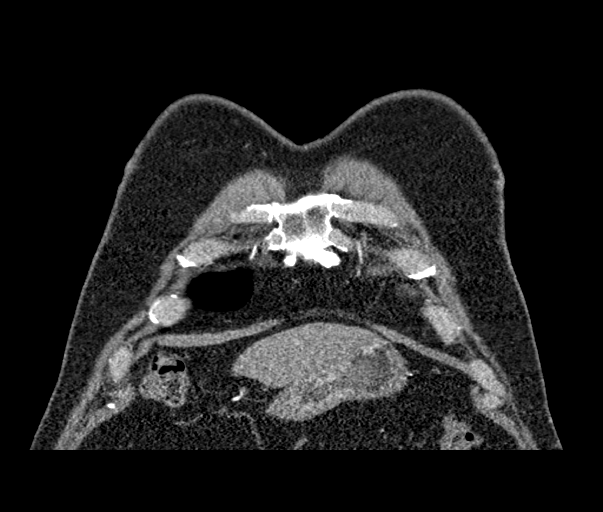
[im 82/164  soft-tissue]
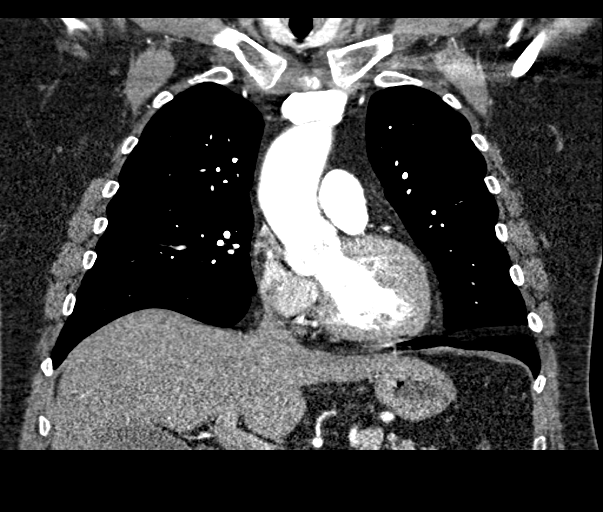
[im 123/164  soft-tissue]
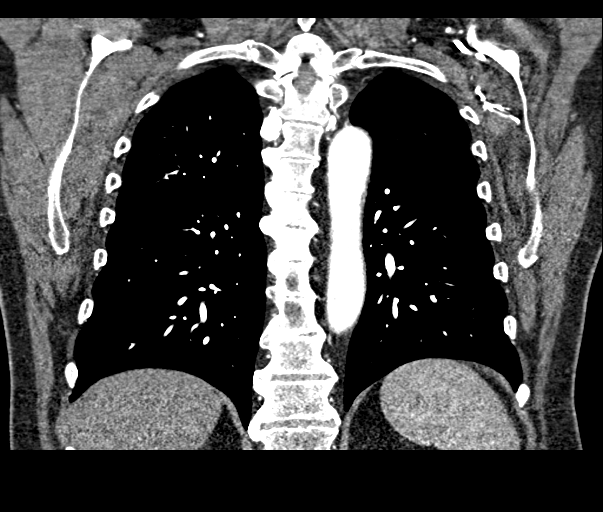

[18 of 46 positions shown; findings below may reference images not displayed]

FINDINGS: The lungs are well aerated bilaterally. No focal infiltrate or
sizable effusion is seen. No parenchymal nodules are noted.

The thoracic inlet is within normal limits. The thoracic aorta is
branches demonstrate some mild calcifications. Dilatation of the
ascending aorta to 3.9 cm is noted. Normal tapering of the distal
aorta and aortic arch is seen. Heavy coronary calcifications are
noted. The pulmonary artery shows no evidence of pulmonary emboli.
No hilar or mediastinal adenopathy is noted. The upper abdomen shows
fatty infiltration of the liver. No other focal abnormality is seen.
Degenerative changes of the thoracic spine are noted.

Review of the MIP images confirms the above findings.
IMPRESSION: Mild dilatation of the ascending aorta. This appears stable when
compare with the prior echocardiography report.

Heavy coronary calcifications.

No other focal abnormality is noted.

## 2016-06-18 DIAGNOSIS — M7541 Impingement syndrome of right shoulder: Secondary | ICD-10-CM | POA: Diagnosis not present

## 2016-06-20 ENCOUNTER — Other Ambulatory Visit: Payer: Self-pay | Admitting: Family Medicine

## 2016-06-20 ENCOUNTER — Encounter (HOSPITAL_BASED_OUTPATIENT_CLINIC_OR_DEPARTMENT_OTHER): Payer: Self-pay | Admitting: *Deleted

## 2016-06-23 ENCOUNTER — Encounter (HOSPITAL_BASED_OUTPATIENT_CLINIC_OR_DEPARTMENT_OTHER)
Admission: RE | Admit: 2016-06-23 | Discharge: 2016-06-23 | Disposition: A | Discharge status: Home / Self Care | Payer: Medicare Other | Source: Ambulatory Visit | Attending: Orthopedic Surgery | Admitting: Orthopedic Surgery

## 2016-06-23 DIAGNOSIS — G5621 Lesion of ulnar nerve, right upper limb: Secondary | ICD-10-CM | POA: Insufficient documentation

## 2016-06-23 DIAGNOSIS — Z01818 Encounter for other preprocedural examination: Secondary | ICD-10-CM | POA: Diagnosis not present

## 2016-06-23 LAB — BASIC METABOLIC PANEL
ANION GAP: 7 (ref 5–15)
BUN: 12 mg/dL (ref 6–20)
CHLORIDE: 104 mmol/L (ref 101–111)
CO2: 27 mmol/L (ref 22–32)
Calcium: 9.5 mg/dL (ref 8.9–10.3)
Creatinine, Ser: 0.86 mg/dL (ref 0.61–1.24)
GFR calc non Af Amer: >60 mL/min (ref >60)
Glucose, Bld: 74 mg/dL (ref 65–99)
Potassium: 4 mmol/L (ref 3.5–5.1)
Sodium: 138 mmol/L (ref 135–145)

## 2016-06-26 ENCOUNTER — Ambulatory Visit (HOSPITAL_BASED_OUTPATIENT_CLINIC_OR_DEPARTMENT_OTHER): Admission: RE | Admit: 2016-06-26 | Payer: Medicare Other | Source: Ambulatory Visit | Admitting: Orthopedic Surgery

## 2016-06-26 SURGERY — ULNAR NERVE DECOMPRESSION/TRANSPOSITION
Anesthesia: Choice | Laterality: Right

## 2016-06-30 ENCOUNTER — Other Ambulatory Visit: Payer: Self-pay | Admitting: Orthopedic Surgery

## 2016-07-11 ENCOUNTER — Encounter (HOSPITAL_BASED_OUTPATIENT_CLINIC_OR_DEPARTMENT_OTHER): Payer: Self-pay | Admitting: *Deleted

## 2016-07-14 ENCOUNTER — Other Ambulatory Visit: Payer: Self-pay | Admitting: Family Medicine

## 2016-07-14 NOTE — Telephone Encounter (Signed)
Last filled 05-27-16 #40 Last OV 04-23-16 Next OV 10-21-16

## 2016-07-15 NOTE — Telephone Encounter (Signed)
Rx called in as directed.   

## 2016-07-15 NOTE — Telephone Encounter (Signed)
plz phont in

## 2016-07-17 ENCOUNTER — Ambulatory Visit (HOSPITAL_BASED_OUTPATIENT_CLINIC_OR_DEPARTMENT_OTHER): Payer: Medicare Other | Admitting: Anesthesiology

## 2016-07-17 ENCOUNTER — Ambulatory Visit (HOSPITAL_BASED_OUTPATIENT_CLINIC_OR_DEPARTMENT_OTHER)
Admission: RE | Admit: 2016-07-17 | Discharge: 2016-07-17 | Disposition: A | Discharge status: Home / Self Care | Payer: Medicare Other | Source: Ambulatory Visit | Attending: Orthopedic Surgery | Admitting: Orthopedic Surgery

## 2016-07-17 ENCOUNTER — Encounter (HOSPITAL_BASED_OUTPATIENT_CLINIC_OR_DEPARTMENT_OTHER)
Admission: RE | Disposition: A | Discharge status: Home / Self Care | Payer: Self-pay | Source: Ambulatory Visit | Attending: Orthopedic Surgery

## 2016-07-17 ENCOUNTER — Encounter (HOSPITAL_BASED_OUTPATIENT_CLINIC_OR_DEPARTMENT_OTHER): Payer: Self-pay | Admitting: Anesthesiology

## 2016-07-17 DIAGNOSIS — Z89021 Acquired absence of right finger(s): Secondary | ICD-10-CM | POA: Insufficient documentation

## 2016-07-17 DIAGNOSIS — G5621 Lesion of ulnar nerve, right upper limb: Secondary | ICD-10-CM | POA: Insufficient documentation

## 2016-07-17 DIAGNOSIS — I10 Essential (primary) hypertension: Secondary | ICD-10-CM | POA: Insufficient documentation

## 2016-07-17 DIAGNOSIS — E785 Hyperlipidemia, unspecified: Secondary | ICD-10-CM | POA: Diagnosis not present

## 2016-07-17 DIAGNOSIS — Z7982 Long term (current) use of aspirin: Secondary | ICD-10-CM | POA: Diagnosis not present

## 2016-07-17 DIAGNOSIS — N400 Enlarged prostate without lower urinary tract symptoms: Secondary | ICD-10-CM | POA: Diagnosis not present

## 2016-07-17 DIAGNOSIS — Z79899 Other long term (current) drug therapy: Secondary | ICD-10-CM | POA: Insufficient documentation

## 2016-07-17 DIAGNOSIS — E559 Vitamin D deficiency, unspecified: Secondary | ICD-10-CM | POA: Diagnosis not present

## 2016-07-17 HISTORY — PX: ULNAR NERVE TRANSPOSITION: SHX2595

## 2016-07-17 SURGERY — ULNAR NERVE DECOMPRESSION/TRANSPOSITION
Anesthesia: General | Site: Elbow | Laterality: Right

## 2016-07-17 MED ORDER — EPHEDRINE 5 MG/ML INJ
INTRAVENOUS | Status: AC
  Filled 2016-07-17: qty 10

## 2016-07-17 MED ORDER — SUCCINYLCHOLINE CHLORIDE 200 MG/10ML IV SOSY
PREFILLED_SYRINGE | INTRAVENOUS | Status: AC
  Filled 2016-07-17: qty 10

## 2016-07-17 MED ORDER — DEXAMETHASONE SODIUM PHOSPHATE 10 MG/ML IJ SOLN
INTRAMUSCULAR | Status: AC
  Filled 2016-07-17: qty 1

## 2016-07-17 MED ORDER — ONDANSETRON HCL 4 MG/2ML IJ SOLN
INTRAMUSCULAR | Status: AC
  Filled 2016-07-17: qty 2

## 2016-07-17 MED ORDER — CHLORHEXIDINE GLUCONATE 4 % EX LIQD: 60.0000 mL | Freq: Once | CUTANEOUS | Status: DC

## 2016-07-17 MED ORDER — FENTANYL CITRATE (PF) 100 MCG/2ML IJ SOLN
INTRAMUSCULAR | Status: AC
  Filled 2016-07-17: qty 2

## 2016-07-17 MED ORDER — MIDAZOLAM HCL 2 MG/2ML IJ SOLN: 1.0000 mg | INTRAMUSCULAR | Status: DC | PRN

## 2016-07-17 MED ORDER — SCOPOLAMINE 1 MG/3DAYS TD PT72
1.0000 | MEDICATED_PATCH | Freq: Once | TRANSDERMAL | Status: DC | PRN
Start: 2016-07-17 — End: 2016-07-17

## 2016-07-17 MED ORDER — OXYCODONE HCL 5 MG PO TABS
ORAL_TABLET | ORAL | Status: AC
  Filled 2016-07-17: qty 1

## 2016-07-17 MED ORDER — SUCCINYLCHOLINE CHLORIDE 20 MG/ML IJ SOLN
INTRAMUSCULAR | Status: DC | PRN
  Administered 2016-07-17: 50 mg via INTRAVENOUS

## 2016-07-17 MED ORDER — MIDAZOLAM HCL 2 MG/2ML IJ SOLN
INTRAMUSCULAR | Status: AC
  Filled 2016-07-17: qty 2

## 2016-07-17 MED ORDER — PROPOFOL 10 MG/ML IV BOLUS
INTRAVENOUS | Status: AC
  Filled 2016-07-17: qty 20

## 2016-07-17 MED ORDER — HYDROCODONE-ACETAMINOPHEN 5-325 MG PO TABS: ORAL_TABLET | ORAL | 0 refills | Status: DC

## 2016-07-17 MED ORDER — BUPIVACAINE HCL (PF) 0.25 % IJ SOLN
INTRAMUSCULAR | Status: DC | PRN
  Administered 2016-07-17: 10 mL

## 2016-07-17 MED ORDER — MIDAZOLAM HCL 5 MG/5ML IJ SOLN
INTRAMUSCULAR | Status: DC | PRN
  Administered 2016-07-17: 2 mg via INTRAVENOUS

## 2016-07-17 MED ORDER — ONDANSETRON HCL 4 MG/2ML IJ SOLN: 4.0000 mg | Freq: Four times a day (QID) | INTRAMUSCULAR | Status: DC | PRN

## 2016-07-17 MED ORDER — LIDOCAINE HCL (CARDIAC) 20 MG/ML IV SOLN
INTRAVENOUS | Status: DC | PRN
  Administered 2016-07-17: 30 mg via INTRAVENOUS

## 2016-07-17 MED ORDER — PROPOFOL 10 MG/ML IV BOLUS
INTRAVENOUS | Status: DC | PRN
  Administered 2016-07-17 (×2): 150 mg via INTRAVENOUS

## 2016-07-17 MED ORDER — LIDOCAINE 2% (20 MG/ML) 5 ML SYRINGE
INTRAMUSCULAR | Status: AC
  Filled 2016-07-17: qty 5

## 2016-07-17 MED ORDER — FENTANYL CITRATE (PF) 100 MCG/2ML IJ SOLN: 50.0000 ug | INTRAMUSCULAR | Status: DC | PRN

## 2016-07-17 MED ORDER — FENTANYL CITRATE (PF) 100 MCG/2ML IJ SOLN
25.0000 ug | INTRAMUSCULAR | Status: DC | PRN
  Administered 2016-07-17 (×2): 50 ug via INTRAVENOUS

## 2016-07-17 MED ORDER — EPHEDRINE SULFATE 50 MG/ML IJ SOLN
INTRAMUSCULAR | Status: DC | PRN
  Administered 2016-07-17: 10 mg via INTRAVENOUS
  Administered 2016-07-17: 25 mg via INTRAVENOUS

## 2016-07-17 MED ORDER — DEXAMETHASONE SODIUM PHOSPHATE 4 MG/ML IJ SOLN
INTRAMUSCULAR | Status: DC | PRN
  Administered 2016-07-17: 10 mg via INTRAVENOUS

## 2016-07-17 MED ORDER — OXYCODONE HCL 5 MG PO TABS
5.0000 mg | ORAL_TABLET | Freq: Once | ORAL | Status: AC | PRN
  Administered 2016-07-17: 5 mg via ORAL

## 2016-07-17 MED ORDER — CEFAZOLIN SODIUM-DEXTROSE 2-4 GM/100ML-% IV SOLN
INTRAVENOUS | Status: AC
Start: 2016-07-17 — End: 2016-07-17
  Filled 2016-07-17: qty 100

## 2016-07-17 MED ORDER — ONDANSETRON HCL 4 MG/2ML IJ SOLN
INTRAMUSCULAR | Status: DC | PRN
  Administered 2016-07-17: 4 mg via INTRAVENOUS

## 2016-07-17 MED ORDER — CEFAZOLIN SODIUM-DEXTROSE 2-4 GM/100ML-% IV SOLN
2.0000 g | INTRAVENOUS | Status: AC
  Administered 2016-07-17: 2 g via INTRAVENOUS

## 2016-07-17 MED ORDER — LACTATED RINGERS IV SOLN
INTRAVENOUS | Status: DC
  Administered 2016-07-17 (×2): via INTRAVENOUS

## 2016-07-17 MED ORDER — FENTANYL CITRATE (PF) 100 MCG/2ML IJ SOLN
INTRAMUSCULAR | Status: DC | PRN
  Administered 2016-07-17: 100 ug via INTRAVENOUS

## 2016-07-17 MED ORDER — OXYCODONE HCL 5 MG/5ML PO SOLN: 5.0000 mg | Freq: Once | ORAL | Status: AC | PRN

## 2016-07-17 SURGICAL SUPPLY — 56 items
BANDAGE ACE 3X5.8 VEL STRL LF (GAUZE/BANDAGES/DRESSINGS) ×4 IMPLANT
BANDAGE ACE 4X5 VEL STRL LF (GAUZE/BANDAGES/DRESSINGS) ×2 IMPLANT
BLADE MINI RND TIP GREEN BEAV (BLADE) IMPLANT
BLADE SURG 15 STRL LF DISP TIS (BLADE) ×2 IMPLANT
BLADE SURG 15 STRL SS (BLADE) ×4
BNDG CMPR 9X4 STRL LF SNTH (GAUZE/BANDAGES/DRESSINGS) ×1
BNDG ESMARK 4X9 LF (GAUZE/BANDAGES/DRESSINGS) ×2 IMPLANT
BNDG GAUZE ELAST 4 BULKY (GAUZE/BANDAGES/DRESSINGS) ×2 IMPLANT
CHLORAPREP W/TINT 26ML (MISCELLANEOUS) ×2 IMPLANT
CORDS BIPOLAR (ELECTRODE) ×2 IMPLANT
COVER BACK TABLE 60X90IN (DRAPES) ×2 IMPLANT
COVER MAYO STAND STRL (DRAPES) ×2 IMPLANT
CUFF TOURN SGL LL 18 NRW (TOURNIQUET CUFF) ×2 IMPLANT
CUFF TOURNIQUET SINGLE 18IN (TOURNIQUET CUFF) IMPLANT
DECANTER SPIKE VIAL GLASS SM (MISCELLANEOUS) IMPLANT
DRAPE EXTREMITY T 121X128X90 (DRAPE) ×2 IMPLANT
DRAPE SURG 17X23 STRL (DRAPES) ×4 IMPLANT
DRSG PAD ABDOMINAL 8X10 ST (GAUZE/BANDAGES/DRESSINGS) ×2 IMPLANT
GAUZE SPONGE 4X4 12PLY STRL (GAUZE/BANDAGES/DRESSINGS) ×2 IMPLANT
GAUZE SPONGE 4X4 16PLY XRAY LF (GAUZE/BANDAGES/DRESSINGS) IMPLANT
GAUZE XEROFORM 1X8 LF (GAUZE/BANDAGES/DRESSINGS) ×2 IMPLANT
GLOVE BIO SURGEON STRL SZ 6.5 (GLOVE) ×4 IMPLANT
GLOVE BIO SURGEON STRL SZ7.5 (GLOVE) ×2 IMPLANT
GLOVE BIOGEL PI IND STRL 7.0 (GLOVE) ×2 IMPLANT
GLOVE BIOGEL PI IND STRL 8 (GLOVE) ×2 IMPLANT
GLOVE BIOGEL PI IND STRL 8.5 (GLOVE) ×1 IMPLANT
GLOVE BIOGEL PI INDICATOR 7.0 (GLOVE) ×2
GLOVE BIOGEL PI INDICATOR 8 (GLOVE) ×2
GLOVE BIOGEL PI INDICATOR 8.5 (GLOVE) ×1
GLOVE SURG ORTHO 8.0 STRL STRW (GLOVE) ×2 IMPLANT
GOWN STRL REUS W/ TWL LRG LVL3 (GOWN DISPOSABLE) ×1 IMPLANT
GOWN STRL REUS W/TWL LRG LVL3 (GOWN DISPOSABLE) ×2
GOWN STRL REUS W/TWL XL LVL3 (GOWN DISPOSABLE) ×4 IMPLANT
NEEDLE HYPO 25X1 1.5 SAFETY (NEEDLE) IMPLANT
NS IRRIG 1000ML POUR BTL (IV SOLUTION) ×2 IMPLANT
PACK BASIN DAY SURGERY FS (CUSTOM PROCEDURE TRAY) ×2 IMPLANT
PAD CAST 3X4 CTTN HI CHSV (CAST SUPPLIES) ×1 IMPLANT
PAD CAST 4YDX4 CTTN HI CHSV (CAST SUPPLIES) ×1 IMPLANT
PADDING CAST ABS 4INX4YD NS (CAST SUPPLIES) ×1
PADDING CAST ABS COTTON 4X4 ST (CAST SUPPLIES) ×1 IMPLANT
PADDING CAST COTTON 3X4 STRL (CAST SUPPLIES) ×2
PADDING CAST COTTON 4X4 STRL (CAST SUPPLIES) ×2
SLEEVE SCD COMPRESS KNEE MED (MISCELLANEOUS) ×2 IMPLANT
SPLINT FAST PLASTER 5X30 (CAST SUPPLIES)
SPLINT PLASTER CAST FAST 5X30 (CAST SUPPLIES) IMPLANT
SPLINT PLASTER CAST XFAST 3X15 (CAST SUPPLIES) IMPLANT
SPLINT PLASTER XTRA FASTSET 3X (CAST SUPPLIES)
STOCKINETTE 4X48 STRL (DRAPES) ×2 IMPLANT
SUT ETHILON 4 0 PS 2 18 (SUTURE) ×2 IMPLANT
SUT VIC AB 2-0 SH 27 (SUTURE) ×2
SUT VIC AB 2-0 SH 27XBRD (SUTURE) ×1 IMPLANT
SUT VICRYL 4-0 PS2 18IN ABS (SUTURE) IMPLANT
SYR BULB 3OZ (MISCELLANEOUS) ×2 IMPLANT
SYR CONTROL 10ML LL (SYRINGE) ×2 IMPLANT
TOWEL OR 17X24 6PK STRL BLUE (TOWEL DISPOSABLE) ×2 IMPLANT
UNDERPAD 30X30 (UNDERPADS AND DIAPERS) ×2 IMPLANT

## 2016-07-17 NOTE — Op Note (Signed)
I assisted Surgeon(s) and Role:    * Leanora Cover, MD - Primary    * Daryll Brod, MD - Assisting on the Procedure(s): RIGHT ULNAR NERVE DECOMPRESSION on 07/17/2016.  I provided assistance on this case as follows: setup, approach, retraction, identification, decompression of and neurolysis of the ulnar nerve, closure and application of the dressing. I was present for the entire case.  Electronically signed by: Wynonia Sours, MD Date: 07/17/2016 Time: 11:41 AM Setup,

## 2016-07-17 NOTE — Op Note (Signed)
[id number] 

## 2016-07-17 NOTE — Anesthesia Procedure Notes (Addendum)
Procedure Name: Intubation Date/Time: 07/17/2016 10:43 AM Performed by: Marrianne Mood Pre-anesthesia Checklist: Patient identified, Emergency Drugs available, Suction available, Patient being monitored and Timeout performed Patient Re-evaluated:Patient Re-evaluated prior to inductionOxygen Delivery Method: Circle system utilized Preoxygenation: Pre-oxygenation with 100% oxygen Intubation Type: IV induction Ventilation: Mask ventilation without difficulty LMA: LMA inserted LMA Size: 5.0 Laryngoscope Size: Miller and 3 Grade View: Grade II Tube type: Oral Tube size: 8.0 mm Number of attempts: 1 Airway Equipment and Method: Stylet and Oral airway Placement Confirmation: ETT inserted through vocal cords under direct vision,  positive ETCO2 and breath sounds checked- equal and bilateral Secured at: 22 cm Tube secured with: Tape Dental Injury: Teeth and Oropharynx as per pre-operative assessment  Comments: Induction and LMA insertion.  LMA #5 unable to seat well.  Significant leaking with spontaneous respirations.  Decision to convert to ETT.

## 2016-07-17 NOTE — Discharge Instructions (Addendum)
[facility] Instructions °Hand Surgery ° °Wound Care: °Keep your hand elevated above the level of your heart.  Do not allow it to dangle by your side.  Keep the dressing dry and do not remove it unless your doctor advises you to do so.  He will usually change it at the time of your post-op visit.  Moving your fingers is advised to stimulate circulation but will depend on the site of your surgery.  If you have a splint applied, your doctor will advise you regarding movement. ° °Activity: °Do not drive or operate machinery today.  Rest today and then you may return to your normal activity and work as indicated by your physician. ° °Diet:  °Drink liquids today or eat a light diet.  You may resume a regular diet tomorrow.   ° ° ° °Post Anesthesia Home Care Instructions ° °Activity: °Get plenty of rest for the remainder of the day. A responsible adult should stay with you for 24 hours following the procedure.  °For the next 24 hours, DO NOT: °-Drive a car °-Operate machinery °-Drink alcoholic beverages °-Take any medication unless instructed by your physician °-Make any legal decisions or sign important papers. ° °Meals: °Start with liquid foods such as gelatin or soup. Progress to regular foods as tolerated. Avoid greasy, spicy, heavy foods. If nausea and/or vomiting occur, drink only clear liquids until the nausea and/or vomiting subsides. Call your physician if vomiting continues. ° °Special Instructions/Symptoms: °Your throat may feel dry or sore from the anesthesia or the breathing tube placed in your throat during surgery. If this causes discomfort, gargle with warm salt water. The discomfort should disappear within 24 hours. ° °If you had a scopolamine patch placed behind your ear for the management of post- operative nausea and/or vomiting: ° °1. The medication in the patch is effective for 72 hours, after which it should be removed.  Wrap patch in a tissue and discard in the trash. Wash hands thoroughly with  soap and water. °2. You may remove the patch earlier than 72 hours if you experience unpleasant side effects which may include dry mouth, dizziness or visual disturbances. °3. Avoid touching the patch. Wash your hands with soap and water after contact with the patch. °  ° °General expectations: °Pain for two to three days. °Fingers may become slightly swollen. ° °Call your doctor if any of the following occur: °Severe pain not relieved by pain medication. °Elevated temperature. °Dressing soaked with blood. °Inability to move fingers. °White or bluish color to fingers. ° °

## 2016-07-17 NOTE — Brief Op Note (Signed)
07/17/2016  11:41 AM  PATIENT:  Gregory Fisher  66 y.o. male  PRE-OPERATIVE DIAGNOSIS:  RIGHT ULANAR NERVE COMPRESSION  POST-OPERATIVE DIAGNOSIS:  RIGHT ULANAR NERVE COMPRESSION  PROCEDURE:  Procedure(s): RIGHT ULNAR NERVE DECOMPRESSION (Right)  SURGEON:  Surgeon(s) and Role:    * Leanora Cover, MD - Primary    * Daryll Brod, MD - Assisting  PHYSICIAN ASSISTANT:   ASSISTANTS: Daryll Brod, MD   ANESTHESIA:   general  EBL:  Total I/O In: 1400 [I.V.:1400] Out: -   BLOOD ADMINISTERED:none  DRAINS: none   LOCAL MEDICATIONS USED:  MARCAINE     SPECIMEN:  No Specimen  DISPOSITION OF SPECIMEN:  N/A  COUNTS:  YES  TOURNIQUET:   Total Tourniquet Time Documented: Upper Arm (Right) - 48 minutes Total: Upper Arm (Right) - 48 minutes   DICTATION: .Other Dictation: Dictation Number [id number]  PLAN OF CARE: Discharge to home after PACU  PATIENT DISPOSITION:  PACU - hemodynamically stable.
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
## 2016-07-17 NOTE — Anesthesia Postprocedure Evaluation (Signed)
Anesthesia Post Note  Patient: Gregory Fisher  Procedure(s) Performed: Procedure(s) (LRB): RIGHT ULNAR NERVE DECOMPRESSION (Right)  Patient location during evaluation: PACU Anesthesia Type: General Level of consciousness: awake and alert and patient cooperative Pain management: pain level controlled Vital Signs Assessment: post-procedure vital signs reviewed and stable Respiratory status: spontaneous breathing and respiratory function stable Cardiovascular status: stable Anesthetic complications: no       Last Vitals:  Vitals:   07/17/16 1245 07/17/16 1258  BP:  (!) 145/82  Pulse: 79 84  Resp: (!) 21 18  Temp:  36.6 C    Last Pain:  Vitals:   07/17/16 1258  TempSrc:   PainSc: [place]

## 2016-07-17 NOTE — Transfer of Care (Signed)
Immediate Anesthesia Transfer of Care Note  Patient: Marcin W Kervin  Procedure(s) Performed: Procedure(s): RIGHT ULNAR NERVE DECOMPRESSION (Right)  Patient Location: PACU  Anesthesia Type:General  Level of Consciousness: sedated  Airway & Oxygen Therapy: Patient Spontanous Breathing and Patient connected to face mask oxygen  Post-op Assessment: Report given to RN  Post vital signs: Reviewed and stable  Last Vitals:  Vitals:   07/17/16 1146 07/17/16 1147  BP:    Pulse: 73 75  Resp: (!) 6 13  Temp:      Last Pain:  Vitals:   07/17/16 0943  TempSrc: Oral      Patients Stated Pain Goal: 0 ([id number])  Complications: No apparent anesthesia complications

## 2016-07-17 NOTE — Anesthesia Preprocedure Evaluation (Signed)
Anesthesia Evaluation  Patient identified by MRN, date of birth, ID band Patient awake    Reviewed: Allergy & Precautions, H&P , NPO status , Patient's Chart, lab work & pertinent test results  Airway Mallampati: II   Neck ROM: full    Dental   Pulmonary shortness of breath,    breath sounds clear to auscultation       Cardiovascular hypertension, + Peripheral Vascular Disease   Rhythm:regular Rate:Normal     Neuro/Psych    GI/Hepatic   Endo/Other    Renal/GU      Musculoskeletal  (+) Arthritis ,   Abdominal   Peds  Hematology   Anesthesia Other Findings   Reproductive/Obstetrics                             Anesthesia Physical Anesthesia Plan  ASA: III  Anesthesia Plan: General   Post-op Pain Management:    Induction: Intravenous  Airway Management Planned: LMA  Additional Equipment:   Intra-op Plan:   Post-operative Plan:   Informed Consent: I have reviewed the patients History and Physical, chart, labs and discussed the procedure including the risks, benefits and alternatives for the proposed anesthesia with the patient or authorized representative who has indicated his/her understanding and acceptance.     Plan Discussed with: CRNA, Surgeon and Anesthesiologist  Anesthesia Plan Comments:         Anesthesia Quick Evaluation

## 2016-07-17 NOTE — Op Note (Signed)
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  PROCEDURE:  Right ulnar nerve decompression.  SURGEON:  Leanora Cover, MD.  ASSISTANT:  Daryll Brod, MD.  ANESTHESIA:  General IV fluids per anesthesia flow sheet.  ESTIMATED BLOOD LOSS:  Minimal.  COMPLICATIONS:  None.  SPECIMENS:  None.  TOURNIQUET TIME:  48 minutes.  DISPOSITION:  Stable to PACU.  INDICATIONS:  Mr. Seebeck is a 66 year old male, who underwent decompression of his right ulnar nerve previously.  He has had continued symptoms and worsening of symptoms.  He has had worsening of his nerve conduction studies.  He wishes to have a repeat ulnar nerve decompression with possible transposition as necessary.  Risks, benefits, and alternatives of surgery were discussed including risk of blood loss; infection; damage to nerves, vessels, tendons, ligaments, bone; failure of surgery; need for additional surgery; complications with wound healing; continued pain; and continued numbness.  He voiced understanding of these risks, elected to proceed.  OPERATIVE COURSE:  After being identified preoperatively by myself, the patient agreed upon procedure and site of procedure.  Surgical site was marked.  The risks, benefits, and alternatives of surgery were reviewed, he wished to proceed.  Surgical consent had been signed.  He was given IV Ancef as preoperative antibiotic prophylaxis.  He was transferred to the operating room and placed on the operating room table in supine position with right upper extremity on arm board.  General  anesthesia induced by anesthesiologist.  Right upper extremity was prepped and draped in normal sterile orthopedic fashion.  Surgical pause was performed between surgeons, anesthesia, and operating staff; and all were in agreement as to the patient, procedure, and site of procedure. Tourniquet at the proximal aspect of the extremity was inflated to 250 mmHg after exsanguination of the limb with an Esmarch bandage.  Previous incision was followed.  It was extended both proximally and distally. There was scar formation at the medial side of the elbow near the epicondyle in the previous surgical field.  The nerve was localized with proximal and distal to this area.  It was carefully traced back.  The nerve was decompressed distally first.  The fascia over the FCU muscle, itself, and the investing fascia over the nerve were released distally. Finger was placed into the wound to ensure complete decompression, which was the case.  The proximal aspect of the nerve was then decompressed up into the upper arm.  Again, the muscular fascia and the investing fascia around the nerve were released.  The nerve was then carefully freed up from its scar tissue bed centrally.  It was adherent to scar.  It was carefully freed and the scar tissue released.  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  The nerve was then decompressed throughout its course.  It was scarred to the bed between the medial epicondyle and the olecranon.  It was carefully freed up from this.  The elbow was placed through range of motion, the nerve did not subluxate out of the groove.  The anterior leaflet of the ligament and scar tissue was then rolled over and secured to itself with 2-0 Vicryl suture after copiously irrigating this wound with sterile saline.  This provided a good bumper against any nerve subluxation.  The subcutaneous tissues were closed with 2-0 Vicryl in an inverted interrupted  fashion. The skin was closed with 4-0 nylon in horizontal mattress fashion.  The wound was injected with 10 mL of 0.25% plain Marcaine to aid in postoperative analgesia.  It was then dressed with sterile Xeroform, 4x4s, and ABD and wrapped with Kerlix and Ace bandage.  Tourniquet was deflated at 48 minutes.  Fingertips were pink with brisk capillary refill after deflation of tourniquet.  Operative drapes were broken down.  The patient was awoken from anesthesia safely.  He was transferred back to stretcher and taken to PACU in stable condition.  I will see him back in the office in 1 week for postoperative followup.  I will give him Norco 5/325 one to two p.o. q.6 hours p.r.n. pain, dispensed #30.     Leanora Cover, MD     KK/MEDQ  D:  07/17/2016  T:  07/17/2016  Job:  [id number]

## 2016-07-18 ENCOUNTER — Encounter (HOSPITAL_BASED_OUTPATIENT_CLINIC_OR_DEPARTMENT_OTHER): Payer: Self-pay | Admitting: Orthopedic Surgery

## 2016-08-12 DIAGNOSIS — R3911 Hesitancy of micturition: Secondary | ICD-10-CM | POA: Diagnosis not present

## 2016-08-12 DIAGNOSIS — R350 Frequency of micturition: Secondary | ICD-10-CM | POA: Diagnosis not present

## 2016-08-12 DIAGNOSIS — N401 Enlarged prostate with lower urinary tract symptoms: Secondary | ICD-10-CM | POA: Diagnosis not present

## 2016-09-01 DIAGNOSIS — M79642 Pain in left hand: Secondary | ICD-10-CM | POA: Diagnosis not present

## 2016-09-01 DIAGNOSIS — M79641 Pain in right hand: Secondary | ICD-10-CM | POA: Diagnosis not present

## 2016-09-02 DIAGNOSIS — M79642 Pain in left hand: Secondary | ICD-10-CM | POA: Diagnosis not present

## 2016-09-02 DIAGNOSIS — M79641 Pain in right hand: Secondary | ICD-10-CM | POA: Diagnosis not present

## 2016-09-08 ENCOUNTER — Other Ambulatory Visit: Payer: Self-pay | Admitting: Family Medicine

## 2016-09-08 NOTE — Telephone Encounter (Signed)
CPE scheduled for 05/04/17, last filled on 07/15/16 #40 tabs with 0 refills, please advise

## 2016-09-09 NOTE — Telephone Encounter (Signed)
Rx called in as prescribed 

## 2016-09-09 NOTE — Telephone Encounter (Signed)
plz phone in. 

## 2016-10-21 ENCOUNTER — Ambulatory Visit (INDEPENDENT_AMBULATORY_CARE_PROVIDER_SITE_OTHER): Payer: Medicare Other | Admitting: Family Medicine

## 2016-10-21 ENCOUNTER — Encounter: Payer: Self-pay | Admitting: Family Medicine

## 2016-10-21 VITALS — BP 128/78 | HR 76 | Temp 97.6°F | Wt 258.5 lb

## 2016-10-21 DIAGNOSIS — N400 Enlarged prostate without lower urinary tract symptoms: Secondary | ICD-10-CM

## 2016-10-21 DIAGNOSIS — R739 Hyperglycemia, unspecified: Secondary | ICD-10-CM

## 2016-10-21 DIAGNOSIS — E785 Hyperlipidemia, unspecified: Secondary | ICD-10-CM | POA: Diagnosis not present

## 2016-10-21 DIAGNOSIS — I10 Essential (primary) hypertension: Secondary | ICD-10-CM | POA: Diagnosis not present

## 2016-10-21 DIAGNOSIS — I712 Thoracic aortic aneurysm, without rupture, unspecified: Secondary | ICD-10-CM

## 2016-10-21 LAB — GLUCOSE, POCT (MANUAL RESULT ENTRY): POC GLUCOSE: 108 mg/dL — AB (ref 70–99)

## 2016-10-21 NOTE — Assessment & Plan Note (Signed)
Chronic, stable. Continue current regimen. 
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
## 2016-10-21 NOTE — Patient Instructions (Addendum)
Fingerstick cbg today You are doing well. Continue current medicines Return in 6 months for physical and medicare wellness visits.

## 2016-10-21 NOTE — Assessment & Plan Note (Signed)
Followed by urology. Stable with 2 flomax nightly.

## 2016-10-21 NOTE — Progress Notes (Signed)
Pre visit review using our clinic review tool, if applicable. No additional management support is needed unless otherwise documented below in the visit note. 

## 2016-10-21 NOTE — Addendum Note (Signed)
Addended by: Tammi Sou on: 10/21/2016 10:03 AM   Modules accepted: Orders

## 2016-10-21 NOTE — Assessment & Plan Note (Signed)
Stable CTA 09/2015

## 2016-10-21 NOTE — Assessment & Plan Note (Signed)
Fasting cbg today.

## 2016-10-27 ENCOUNTER — Other Ambulatory Visit: Payer: Self-pay | Admitting: Family Medicine

## 2016-10-27 NOTE — Telephone Encounter (Signed)
Lorazepam last filled 09-09-16 #40 Tramadol last filled 09-08-16 #60  Last OV 10-21-16 Next OV 05-04-17

## 2016-10-28 NOTE — Telephone Encounter (Signed)
Rx's called in . 

## 2016-10-28 NOTE — Telephone Encounter (Signed)
plz phone in. 

## 2016-10-31 ENCOUNTER — Other Ambulatory Visit: Payer: Self-pay | Admitting: Family Medicine

## 2016-11-05 ENCOUNTER — Other Ambulatory Visit: Payer: Self-pay | Admitting: Orthopedic Surgery

## 2016-11-21 ENCOUNTER — Encounter (HOSPITAL_BASED_OUTPATIENT_CLINIC_OR_DEPARTMENT_OTHER)
Admission: RE | Admit: 2016-11-21 | Discharge: 2016-11-21 | Disposition: A | Discharge status: Home / Self Care | Payer: Medicare Other | Source: Ambulatory Visit | Attending: Orthopedic Surgery | Admitting: Orthopedic Surgery

## 2016-11-21 ENCOUNTER — Encounter (HOSPITAL_BASED_OUTPATIENT_CLINIC_OR_DEPARTMENT_OTHER): Payer: Self-pay | Admitting: *Deleted

## 2016-11-21 DIAGNOSIS — E785 Hyperlipidemia, unspecified: Secondary | ICD-10-CM | POA: Diagnosis not present

## 2016-11-21 DIAGNOSIS — N400 Enlarged prostate without lower urinary tract symptoms: Secondary | ICD-10-CM | POA: Diagnosis not present

## 2016-11-21 DIAGNOSIS — I10 Essential (primary) hypertension: Secondary | ICD-10-CM | POA: Diagnosis not present

## 2016-11-21 DIAGNOSIS — G5622 Lesion of ulnar nerve, left upper limb: Secondary | ICD-10-CM | POA: Diagnosis not present

## 2016-11-21 DIAGNOSIS — R3911 Hesitancy of micturition: Secondary | ICD-10-CM | POA: Diagnosis not present

## 2016-11-21 DIAGNOSIS — N401 Enlarged prostate with lower urinary tract symptoms: Secondary | ICD-10-CM | POA: Diagnosis not present

## 2016-11-21 DIAGNOSIS — Z79899 Other long term (current) drug therapy: Secondary | ICD-10-CM | POA: Diagnosis not present

## 2016-11-21 DIAGNOSIS — Z7982 Long term (current) use of aspirin: Secondary | ICD-10-CM | POA: Diagnosis not present

## 2016-11-21 DIAGNOSIS — R350 Frequency of micturition: Secondary | ICD-10-CM | POA: Diagnosis not present

## 2016-11-21 LAB — BASIC METABOLIC PANEL
Anion gap: 7 (ref 5–15)
BUN: 9 mg/dL (ref 6–20)
CALCIUM: 9.3 mg/dL (ref 8.9–10.3)
CO2: 29 mmol/L (ref 22–32)
CREATININE: 1.12 mg/dL (ref 0.61–1.24)
Chloride: 102 mmol/L (ref 101–111)
GFR calc non Af Amer: >60 mL/min (ref >60)
Glucose, Bld: 105 mg/dL — ABNORMAL HIGH (ref 65–99)
Potassium: 3.8 mmol/L (ref 3.5–5.1)
SODIUM: 138 mmol/L (ref 135–145)

## 2016-11-21 NOTE — Progress Notes (Signed)
EKG reviewed by Dr. Foster, will proceed with surgery as scheduled. 

## 2016-11-27 ENCOUNTER — Ambulatory Visit (HOSPITAL_BASED_OUTPATIENT_CLINIC_OR_DEPARTMENT_OTHER)
Admission: RE | Admit: 2016-11-27 | Discharge: 2016-11-27 | Disposition: A | Discharge status: Home / Self Care | Payer: Medicare Other | Source: Ambulatory Visit | Attending: Orthopedic Surgery | Admitting: Orthopedic Surgery

## 2016-11-27 ENCOUNTER — Encounter (HOSPITAL_BASED_OUTPATIENT_CLINIC_OR_DEPARTMENT_OTHER)
Admission: RE | Disposition: A | Discharge status: Home / Self Care | Payer: Self-pay | Source: Ambulatory Visit | Attending: Orthopedic Surgery

## 2016-11-27 ENCOUNTER — Encounter (HOSPITAL_BASED_OUTPATIENT_CLINIC_OR_DEPARTMENT_OTHER): Payer: Self-pay | Admitting: *Deleted

## 2016-11-27 ENCOUNTER — Ambulatory Visit (HOSPITAL_BASED_OUTPATIENT_CLINIC_OR_DEPARTMENT_OTHER): Payer: Medicare Other | Admitting: Anesthesiology

## 2016-11-27 DIAGNOSIS — Z7982 Long term (current) use of aspirin: Secondary | ICD-10-CM | POA: Diagnosis not present

## 2016-11-27 DIAGNOSIS — I10 Essential (primary) hypertension: Secondary | ICD-10-CM | POA: Diagnosis not present

## 2016-11-27 DIAGNOSIS — G5622 Lesion of ulnar nerve, left upper limb: Secondary | ICD-10-CM | POA: Insufficient documentation

## 2016-11-27 DIAGNOSIS — Z79899 Other long term (current) drug therapy: Secondary | ICD-10-CM | POA: Diagnosis not present

## 2016-11-27 DIAGNOSIS — N401 Enlarged prostate with lower urinary tract symptoms: Secondary | ICD-10-CM | POA: Diagnosis not present

## 2016-11-27 DIAGNOSIS — R3911 Hesitancy of micturition: Secondary | ICD-10-CM | POA: Insufficient documentation

## 2016-11-27 DIAGNOSIS — R350 Frequency of micturition: Secondary | ICD-10-CM | POA: Diagnosis not present

## 2016-11-27 DIAGNOSIS — N400 Enlarged prostate without lower urinary tract symptoms: Secondary | ICD-10-CM | POA: Diagnosis not present

## 2016-11-27 DIAGNOSIS — E785 Hyperlipidemia, unspecified: Secondary | ICD-10-CM | POA: Diagnosis not present

## 2016-11-27 HISTORY — PX: ULNAR NERVE TRANSPOSITION: SHX2595

## 2016-11-27 SURGERY — ULNAR NERVE DECOMPRESSION/TRANSPOSITION
Anesthesia: General | Site: Elbow | Laterality: Left

## 2016-11-27 MED ORDER — EPHEDRINE 5 MG/ML INJ
INTRAVENOUS | Status: AC
  Filled 2016-11-27: qty 10

## 2016-11-27 MED ORDER — FENTANYL CITRATE (PF) 100 MCG/2ML IJ SOLN
INTRAMUSCULAR | Status: AC
  Filled 2016-11-27: qty 2

## 2016-11-27 MED ORDER — PROMETHAZINE HCL 25 MG/ML IJ SOLN: 6.2500 mg | INTRAMUSCULAR | Status: DC | PRN

## 2016-11-27 MED ORDER — CEFAZOLIN SODIUM-DEXTROSE 2-4 GM/100ML-% IV SOLN
INTRAVENOUS | Status: AC
  Filled 2016-11-27: qty 100

## 2016-11-27 MED ORDER — DEXAMETHASONE SODIUM PHOSPHATE 10 MG/ML IJ SOLN
INTRAMUSCULAR | Status: AC
  Filled 2016-11-27: qty 1

## 2016-11-27 MED ORDER — CEFAZOLIN SODIUM-DEXTROSE 2-4 GM/100ML-% IV SOLN
2.0000 g | INTRAVENOUS | Status: AC
  Administered 2016-11-27: 2 g via INTRAVENOUS

## 2016-11-27 MED ORDER — LIDOCAINE 2% (20 MG/ML) 5 ML SYRINGE
INTRAMUSCULAR | Status: AC
  Filled 2016-11-27: qty 5

## 2016-11-27 MED ORDER — PHENYLEPHRINE HCL 10 MG/ML IJ SOLN
INTRAMUSCULAR | Status: DC | PRN
  Administered 2016-11-27 (×7): 80 ug via INTRAVENOUS

## 2016-11-27 MED ORDER — PHENYLEPHRINE 40 MCG/ML (10ML) SYRINGE FOR IV PUSH (FOR BLOOD PRESSURE SUPPORT)
PREFILLED_SYRINGE | INTRAVENOUS | Status: AC
  Filled 2016-11-27: qty 10

## 2016-11-27 MED ORDER — ONDANSETRON HCL 4 MG/2ML IJ SOLN
INTRAMUSCULAR | Status: AC
  Filled 2016-11-27: qty 2

## 2016-11-27 MED ORDER — FENTANYL CITRATE (PF) 100 MCG/2ML IJ SOLN
50.0000 ug | INTRAMUSCULAR | Status: AC | PRN
  Administered 2016-11-27 (×2): 50 ug via INTRAVENOUS
  Administered 2016-11-27: 25 ug via INTRAVENOUS

## 2016-11-27 MED ORDER — MEPERIDINE HCL 25 MG/ML IJ SOLN: 6.2500 mg | INTRAMUSCULAR | Status: DC | PRN

## 2016-11-27 MED ORDER — BUPIVACAINE HCL (PF) 0.25 % IJ SOLN
INTRAMUSCULAR | Status: DC | PRN
  Administered 2016-11-27: 10 mL

## 2016-11-27 MED ORDER — HYDROCODONE-ACETAMINOPHEN 5-325 MG PO TABS: ORAL_TABLET | ORAL | 0 refills | Status: DC

## 2016-11-27 MED ORDER — MIDAZOLAM HCL 2 MG/2ML IJ SOLN
INTRAMUSCULAR | Status: AC
  Filled 2016-11-27: qty 2

## 2016-11-27 MED ORDER — CHLORHEXIDINE GLUCONATE 4 % EX LIQD: 60.0000 mL | Freq: Once | CUTANEOUS | Status: DC

## 2016-11-27 MED ORDER — MIDAZOLAM HCL 2 MG/2ML IJ SOLN
1.0000 mg | INTRAMUSCULAR | Status: DC | PRN
  Administered 2016-11-27: 2 mg via INTRAVENOUS

## 2016-11-27 MED ORDER — OXYCODONE HCL 5 MG PO TABS: 5.0000 mg | ORAL_TABLET | Freq: Once | ORAL | Status: DC | PRN

## 2016-11-27 MED ORDER — 0.9 % SODIUM CHLORIDE (POUR BTL) OPTIME
TOPICAL | Status: DC | PRN
  Administered 2016-11-27: 1000 mL

## 2016-11-27 MED ORDER — LIDOCAINE 2% (20 MG/ML) 5 ML SYRINGE
INTRAMUSCULAR | Status: DC | PRN
  Administered 2016-11-27: 60 mg via INTRAVENOUS

## 2016-11-27 MED ORDER — HYDROMORPHONE HCL 1 MG/ML IJ SOLN: 0.2500 mg | INTRAMUSCULAR | Status: DC | PRN

## 2016-11-27 MED ORDER — SCOPOLAMINE 1 MG/3DAYS TD PT72: 1.0000 | MEDICATED_PATCH | Freq: Once | TRANSDERMAL | Status: DC | PRN

## 2016-11-27 MED ORDER — PROPOFOL 10 MG/ML IV BOLUS
INTRAVENOUS | Status: DC | PRN
  Administered 2016-11-27: 200 mg via INTRAVENOUS
  Administered 2016-11-27 (×2): 15 mg via INTRAVENOUS

## 2016-11-27 MED ORDER — PROPOFOL 10 MG/ML IV BOLUS
INTRAVENOUS | Status: AC
Start: 2016-11-27 — End: 2016-11-27
  Filled 2016-11-27: qty 20

## 2016-11-27 MED ORDER — OXYCODONE HCL 5 MG/5ML PO SOLN: 5.0000 mg | Freq: Once | ORAL | Status: DC | PRN

## 2016-11-27 MED ORDER — EPHEDRINE SULFATE 50 MG/ML IJ SOLN
INTRAMUSCULAR | Status: DC | PRN
  Administered 2016-11-27 (×2): 10 mg via INTRAVENOUS

## 2016-11-27 MED ORDER — LACTATED RINGERS IV SOLN
INTRAVENOUS | Status: DC
  Administered 2016-11-27 (×2): via INTRAVENOUS

## 2016-11-27 SURGICAL SUPPLY — 53 items
BANDAGE ACE 3X5.8 VEL STRL LF (GAUZE/BANDAGES/DRESSINGS) ×3 IMPLANT
BANDAGE ACE 4X5 VEL STRL LF (GAUZE/BANDAGES/DRESSINGS) ×3 IMPLANT
BLADE MINI RND TIP GREEN BEAV (BLADE) IMPLANT
BLADE SURG 15 STRL LF DISP TIS (BLADE) ×2 IMPLANT
BLADE SURG 15 STRL SS (BLADE) ×6
BNDG CMPR 9X4 STRL LF SNTH (GAUZE/BANDAGES/DRESSINGS) ×1
BNDG ESMARK 4X9 LF (GAUZE/BANDAGES/DRESSINGS) ×3 IMPLANT
BNDG GAUZE ELAST 4 BULKY (GAUZE/BANDAGES/DRESSINGS) ×3 IMPLANT
CHLORAPREP W/TINT 26ML (MISCELLANEOUS) ×3 IMPLANT
CORDS BIPOLAR (ELECTRODE) ×3 IMPLANT
COVER BACK TABLE 60X90IN (DRAPES) ×3 IMPLANT
COVER MAYO STAND STRL (DRAPES) ×3 IMPLANT
CUFF TOURN SGL LL 18 NRW (TOURNIQUET CUFF) ×3 IMPLANT
CUFF TOURNIQUET SINGLE 18IN (TOURNIQUET CUFF) IMPLANT
DECANTER SPIKE VIAL GLASS SM (MISCELLANEOUS) IMPLANT
DRAPE EXTREMITY T 121X128X90 (DRAPE) ×3 IMPLANT
DRAPE SURG 17X23 STRL (DRAPES) ×3 IMPLANT
DRSG PAD ABDOMINAL 8X10 ST (GAUZE/BANDAGES/DRESSINGS) ×3 IMPLANT
GAUZE SPONGE 4X4 12PLY STRL (GAUZE/BANDAGES/DRESSINGS) ×3 IMPLANT
GAUZE SPONGE 4X4 16PLY XRAY LF (GAUZE/BANDAGES/DRESSINGS) IMPLANT
GAUZE XEROFORM 1X8 LF (GAUZE/BANDAGES/DRESSINGS) ×3 IMPLANT
GLOVE BIO SURGEON STRL SZ7.5 (GLOVE) ×3 IMPLANT
GLOVE BIOGEL PI IND STRL 8 (GLOVE) ×1 IMPLANT
GLOVE BIOGEL PI IND STRL 8.5 (GLOVE) ×1 IMPLANT
GLOVE BIOGEL PI INDICATOR 8 (GLOVE) ×2
GLOVE BIOGEL PI INDICATOR 8.5 (GLOVE) ×2
GLOVE SURG ORTHO 8.0 STRL STRW (GLOVE) ×3 IMPLANT
GOWN STRL REUS W/ TWL LRG LVL3 (GOWN DISPOSABLE) ×1 IMPLANT
GOWN STRL REUS W/TWL LRG LVL3 (GOWN DISPOSABLE) ×3
GOWN STRL REUS W/TWL XL LVL3 (GOWN DISPOSABLE) ×6 IMPLANT
NEEDLE HYPO 25X1 1.5 SAFETY (NEEDLE) IMPLANT
NS IRRIG 1000ML POUR BTL (IV SOLUTION) ×3 IMPLANT
PACK BASIN DAY SURGERY FS (CUSTOM PROCEDURE TRAY) ×3 IMPLANT
PAD CAST 3X4 CTTN HI CHSV (CAST SUPPLIES) ×1 IMPLANT
PAD CAST 4YDX4 CTTN HI CHSV (CAST SUPPLIES) ×1 IMPLANT
PADDING CAST ABS 4INX4YD NS (CAST SUPPLIES) ×2
PADDING CAST ABS COTTON 4X4 ST (CAST SUPPLIES) ×1 IMPLANT
PADDING CAST COTTON 3X4 STRL (CAST SUPPLIES) ×3
PADDING CAST COTTON 4X4 STRL (CAST SUPPLIES) ×3
SLEEVE SCD COMPRESS KNEE MED (MISCELLANEOUS) ×3 IMPLANT
SPLINT FAST PLASTER 5X30 (CAST SUPPLIES)
SPLINT PLASTER CAST FAST 5X30 (CAST SUPPLIES) IMPLANT
SPLINT PLASTER CAST XFAST 3X15 (CAST SUPPLIES) IMPLANT
SPLINT PLASTER XTRA FASTSET 3X (CAST SUPPLIES)
STOCKINETTE 4X48 STRL (DRAPES) ×3 IMPLANT
SUT ETHILON 4 0 PS 2 18 (SUTURE) ×3 IMPLANT
SUT VIC AB 2-0 SH 27 (SUTURE) ×3
SUT VIC AB 2-0 SH 27XBRD (SUTURE) ×1 IMPLANT
SUT VICRYL 4-0 PS2 18IN ABS (SUTURE) IMPLANT
SYR BULB 3OZ (MISCELLANEOUS) ×3 IMPLANT
SYR CONTROL 10ML LL (SYRINGE) IMPLANT
TOWEL OR 17X24 6PK STRL BLUE (TOWEL DISPOSABLE) ×3 IMPLANT
UNDERPAD 30X30 (UNDERPADS AND DIAPERS) ×3 IMPLANT

## 2016-11-27 NOTE — Anesthesia Postprocedure Evaluation (Signed)
Anesthesia Post Note  Patient: Gregory Fisher  Procedure(s) Performed: Procedure(s) (LRB): LEFT ULNAR NERVE DECOMPRESSION AT ELBOW (Left)     Patient location during evaluation: PACU Anesthesia Type: General Level of consciousness: awake and alert Pain management: pain level controlled Vital Signs Assessment: post-procedure vital signs reviewed and stable Respiratory status: spontaneous breathing, nonlabored ventilation and respiratory function stable Cardiovascular status: blood pressure returned to baseline and stable Postop Assessment: no signs of nausea or vomiting Anesthetic complications: no    Last Vitals:  Vitals:   11/27/16 1300 11/27/16 1317  BP: 104/88 120/86  Pulse: 83 79  Resp: (!) 22   Temp:  36.6 C    Last Pain:  Vitals:   11/27/16 1317  TempSrc:   PainSc: 0-No pain                 Lynda Rainwater

## 2016-11-27 NOTE — Anesthesia Procedure Notes (Signed)
Procedure Name: LMA Insertion Date/Time: 11/27/2016 11:39 AM Performed by: Lieutenant Diego Pre-anesthesia Checklist: Patient identified, Emergency Drugs available, Suction available and Patient being monitored Patient Re-evaluated:Patient Re-evaluated prior to inductionOxygen Delivery Method: Circle system utilized Preoxygenation: Pre-oxygenation with 100% oxygen Intubation Type: IV induction Ventilation: Mask ventilation without difficulty LMA: LMA inserted LMA Size: 4.0 Number of attempts: 1 Airway Equipment and Method: Bite block Placement Confirmation: positive ETCO2 and breath sounds checked- equal and bilateral Tube secured with: Tape Dental Injury: Teeth and Oropharynx as per pre-operative assessment

## 2016-11-27 NOTE — Op Note (Signed)
[id number] 

## 2016-11-27 NOTE — Discharge Instructions (Addendum)

## 2016-11-27 NOTE — Op Note (Signed)
Gregory Fisher, Gregory Fisher               ACCOUNT NO.:  [id number]  MEDICAL RECORD NO.:  [id number]  LOCATION:                                 FACILITY:  PHYSICIAN:  Leanora Cover, MD             DATE OF BIRTH:  DATE OF PROCEDURE:  11/27/2016 DATE OF DISCHARGE:                              OPERATIVE REPORT   PREOPERATIVE DIAGNOSIS:  Left ulnar nerve compression at the elbow.  POSTOPERATIVE DIAGNOSIS:  Left ulnar nerve compression at the elbow.  PROCEDURE:  Left ulnar nerve decompression at elbow.  SURGEON:  Leanora Cover, MD  ASSISTANT:  Daryll Brod, M.D.  ANESTHESIA:  General.  IV FLUIDS:  Per Anesthesia flow sheet.  ESTIMATED BLOOD LOSS:  Minimal.  COMPLICATIONS:  None.  SPECIMENS:  None.  TOURNIQUET TIME:  19 minutes.  DISPOSITION:  Stable to PACU.  INDICATIONS:  Gregory Fisher is a 66 year old male with numbness and tingling in the ring and small fingers of the left hand.  He has positive nerve conduction studies.  He wished to have an ulnar nerve decompression.  Risks, benefits, and alternatives of surgery were discussed including the risk of blood loss; infection; damage to nerves, vessels, tendons, ligaments, and bone; failure of surgery; need for additional surgery; complications with wound healing; continued pain; and continued numbness.  He voiced understanding of these risks, elected to proceed.  OPERATIVE COURSE:  After being identified preoperatively by myself, the patient and I agreed upon the procedure and site of the procedure. Surgical site was marked.  The risks, benefits, and alternatives of surgery were reviewed and he wished to proceed.  Surgical consent had been signed.  He was given IV Ancef as preoperative antibiotic prophylaxis.  He was transported to the operating room and placed on the operating room table in supine position with left upper extremity on arm board.  General anesthesia induced by anesthesiologist.  Left upper extremity was prepped and  draped in normal sterile orthopedic fashion. Surgical pause was performed between surgeons, Anesthesia, and operating room staff; and all were in agreement as to the patient, procedure, and site of procedure.  Tourniquet at the proximal aspect of the extremity inflated to 250 mmHg after exsanguination of the limb with an Esmarch bandage.  Incision was made at the medial side of the elbow and carried into subcutaneous tissues by spreading technique.  Bipolar electrocautery was used to obtain hemostasis.  Cutaneous branches of the nerve were protected.  The ulnar nerve was identified just proximal to [facility] ligament.  It was then decompressed in a distal fashion including Osborne ligament and the 2 heads of the FCU.  Finger was placed into the wound to ensure complete decompression.  The nerve was then decompressed proximally.  The finger was again placed into the wound to ensure complete decompression.  The elbow was placed through range of motion and the nerve did not subluxate.  The wound was copiously irrigated with sterile saline.  The anterior leaflet of Osborne ligament was repaired to the posterior subcutaneous tissues. The incision had been made anterior to the medial epicondyle.  This provided good buffer for any subluxation of the nerve.  The skin was closed with 4-0 nylon in a horizontal mattress fashion.  The wound was injected with 10 mL of 0.25% plain Marcaine to aid in postoperative analgesia.  It was dressed with sterile Xeroform, 4x4s, and ABD and wrapped with a Kerlix and Ace bandage.  Tourniquet was deflated at 19 minutes.  Fingertips were pink with brisk capillary refill after deflation of tourniquet.  Operative drapes were broken down.  The patient was awoken from anesthesia safely.  He was transferred back to stretcher and taken to the PACU in stable condition.  I will see him back in the office in 1 week for postoperative followup.  I will give him Norco 5/325 one  to two p.o. q.6 hours p.r.n. pain, dispensed #20.     Leanora Cover, MD     KK/MEDQ  D:  11/27/2016  T:  11/27/2016  Job:  [id number]

## 2016-11-27 NOTE — Brief Op Note (Signed)
11/27/2016  12:27 PM  PATIENT:  Jenel Lucks Peterkin  66 y.o. male  PRE-OPERATIVE DIAGNOSIS:  left ulnar nerve compression G56.22  POST-OPERATIVE DIAGNOSIS:  left ulnar nerve compression G56.22  PROCEDURE:  Procedure(s): LEFT ULNAR NERVE DECOMPRESSION AT ELBOW (Left)  SURGEON:  Surgeon(s) and Role:    * Leanora Cover, MD - Primary    * Daryll Brod, MD - Assisting  PHYSICIAN ASSISTANT:   ASSISTANTS: Daryll Brod, MD   ANESTHESIA:   general  EBL:  Total I/O In: 1000 [I.V.:1000] Out: 5 [Blood:5]  BLOOD ADMINISTERED:none  DRAINS: none   LOCAL MEDICATIONS USED:  MARCAINE     SPECIMEN:  No Specimen  DISPOSITION OF SPECIMEN:  N/A  COUNTS:  YES  TOURNIQUET:  Left arm: 19 minutes at 250 mmHg  DICTATION: .Other Dictation: Dictation Number [id number]  PLAN OF CARE: Discharge to home after PACU  PATIENT DISPOSITION:  PACU - hemodynamically stable.

## 2016-11-27 NOTE — Anesthesia Preprocedure Evaluation (Signed)
Anesthesia Evaluation  Patient identified by MRN, date of birth, ID band Patient awake    Reviewed: Allergy & Precautions, H&P , NPO status , Patient's Chart, lab work & pertinent test results  Airway Mallampati: II   Neck ROM: full    Dental   Pulmonary shortness of breath,    breath sounds clear to auscultation       Cardiovascular hypertension, + Peripheral Vascular Disease   Rhythm:regular Rate:Normal     Neuro/Psych    GI/Hepatic   Endo/Other    Renal/GU      Musculoskeletal  (+) Arthritis ,   Abdominal   Peds  Hematology   Anesthesia Other Findings   Reproductive/Obstetrics                             Anesthesia Physical  Anesthesia Plan  ASA: III  Anesthesia Plan: General   Post-op Pain Management:    Induction: Intravenous  PONV Risk Score and Plan: 2 and Ondansetron, Midazolam and Treatment may vary due to age or medical condition  Airway Management Planned: LMA  Additional Equipment:   Intra-op Plan:   Post-operative Plan:   Informed Consent: I have reviewed the patients History and Physical, chart, labs and discussed the procedure including the risks, benefits and alternatives for the proposed anesthesia with the patient or authorized representative who has indicated his/her understanding and acceptance.     Plan Discussed with: CRNA, Surgeon and Anesthesiologist  Anesthesia Plan Comments:         Anesthesia Quick Evaluation
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
## 2016-11-27 NOTE — Transfer of Care (Signed)
Immediate Anesthesia Transfer of Care Note  Patient: Gregory Fisher  Procedure(s) Performed: Procedure(s): LEFT ULNAR NERVE DECOMPRESSION AT ELBOW (Left)  Patient Location: PACU  Anesthesia Type:General  Level of Consciousness: awake  Airway & Oxygen Therapy: Patient Spontanous Breathing and Patient connected to face mask oxygen  Post-op Assessment: Report given to RN and Post -op Vital signs reviewed and stable  Post vital signs: Reviewed and stable  Last Vitals:  Vitals:   11/27/16 0955  BP: 124/80  Pulse: 88  Resp: 20  Temp: 36.5 C    Last Pain:  Vitals:   11/27/16 0955  TempSrc: Oral         Complications: No apparent anesthesia complications

## 2016-11-27 NOTE — Op Note (Signed)
I assisted Surgeon(s) and Role:    * Leanora Cover, MD - Primary    Daryll Brod, MD - Assisting on the Procedure(s): LEFT ULNAR NERVE DECOMPRESSION AT [facility] on 11/27/2016.  I provided assistance on this case as follows: setup, approach, identification of the nerve, decompression of the nerve, closure of the would and application of the dressing. I was present for the entire case.  Electronically signed by: Wynonia Sours, MD Date: 11/27/2016 Time: 12:38 PM

## 2016-11-28 ENCOUNTER — Encounter (HOSPITAL_BASED_OUTPATIENT_CLINIC_OR_DEPARTMENT_OTHER): Payer: Self-pay | Admitting: Orthopedic Surgery

## 2017-02-06 ENCOUNTER — Encounter: Payer: Self-pay | Admitting: Family Medicine

## 2017-02-06 ENCOUNTER — Ambulatory Visit (INDEPENDENT_AMBULATORY_CARE_PROVIDER_SITE_OTHER): Payer: Medicare Other | Admitting: Family Medicine

## 2017-02-06 VITALS — BP 118/74 | HR 84 | Temp 97.5°F | Wt 249.2 lb

## 2017-02-06 DIAGNOSIS — M25561 Pain in right knee: Secondary | ICD-10-CM | POA: Diagnosis not present

## 2017-02-06 DIAGNOSIS — E669 Obesity, unspecified: Secondary | ICD-10-CM

## 2017-02-06 DIAGNOSIS — IMO0001 Reserved for inherently not codable concepts without codable children: Secondary | ICD-10-CM

## 2017-02-06 DIAGNOSIS — M1711 Unilateral primary osteoarthritis, right knee: Secondary | ICD-10-CM | POA: Insufficient documentation

## 2017-02-06 NOTE — Patient Instructions (Addendum)
cbg today. Work on [employer] changes - decrease simple carbs (white breads, white pasta, rice, regular potatoes).  I think knee pain is coming from wear and tear arthritis - keep leg elevated, may use ice to knee, and work on activity levels and weight loss.

## 2017-02-06 NOTE — Assessment & Plan Note (Signed)
Discussed healthy diet and lifestyle changes to affect sustainable weight loss. Pt remains worried about glycemic control and requests cbg today fasting = 106

## 2017-02-06 NOTE — Progress Notes (Signed)
BP 118/74   Pulse 84   Temp (!) 97.5 F (36.4 C) (Oral)   Wt 249 lb 4 oz (113.1 kg)   SpO2 94%   BMI 39.63 kg/m    CC: knee pain, weight gain Subjective:    Patient ID: Gregory Fisher, male    DOB: 12-Apr-1951, 66 y.o.   MRN: [id number]  HPI: Gregory Fisher is a 66 y.o. male presenting on 02/06/2017 for Knee Pain and Weight Gain (wants glucose labs)   Intermittent R anterior knee pain that comes and goes. No locking or instability. No posterior knee pain.   Fasting today - would like cbg checked today. Wonders if he needs glucose meter.   Lab Results  Component Value Date   HGBA1C 5.4 08/30/2015    Relevant past medical, surgical, family and social history reviewed and updated as indicated. Interim medical history since our last visit reviewed. Allergies and medications reviewed and updated. Outpatient Medications Prior to Visit  Medication Sig Dispense Refill  . aspirin EC 81 MG tablet Take 81 mg by mouth daily.     Marland Kitchen atorvastatin (LIPITOR) 80 MG tablet TAKE 1 TABLET BY MOUTH  DAILY 90 tablet 2  . carvedilol (COREG) 12.5 MG tablet TAKE 1 TABLET BY MOUTH  TWICE DAILY WITH A MEAL 180 tablet 2  . cloNIDine (CATAPRES) 0.1 MG tablet TAKE 1 TABLET BY MOUTH TWO  TIMES DAILY 180 tablet 2  . LORazepam (ATIVAN) 1 MG tablet TAKE 1 TABLET BY MOUTH EVERY DAY AS NEEDED 40 tablet 3  . magnesium hydroxide (MILK OF MAGNESIA) 400 MG/5ML suspension Take 5 mL by mouth daily as needed for constipation.    . Naproxen Sodium (ALEVE PO) Take 1 tablet by mouth daily as needed.    . tamsulosin (FLOMAX) 0.4 MG CAPS capsule Take 2 capsules (0.8 mg total) by mouth at bedtime.    . valsartan-hydrochlorothiazide (DIOVAN-HCT) 320-12.5 MG tablet TAKE 1 TABLET BY MOUTH  DAILY 90 tablet 2  . vitamin C (ASCORBIC ACID) 500 MG tablet Take 500 mg by mouth daily.    Marland Kitchen HYDROcodone-acetaminophen (NORCO) 5-325 MG tablet 1-2 tabs po q6 hours prn pain 20 tablet 0  . psyllium (METAMUCIL) 58.6 % powder Take 1 packet  by mouth daily as needed (Constipation).      No facility-administered medications prior to visit.      Per HPI unless specifically indicated in ROS section below Review of Systems     Objective:    BP 118/74   Pulse 84   Temp (!) 97.5 F (36.4 C) (Oral)   Wt 249 lb 4 oz (113.1 kg)   SpO2 94%   BMI 39.63 kg/m   Wt Readings from Last 3 Encounters:  02/06/17 249 lb 4 oz (113.1 kg)  11/27/16 247 lb (112 kg)  10/21/16 258 lb 8 oz (117.3 kg)    Physical Exam  Constitutional: He appears well-developed and well-nourished. No distress.  Musculoskeletal: Normal range of motion. He exhibits no edema.  L knee WNL R knee exam: No deformity on inspection. No pain with palpation of knee landmarks. No effusion/swelling noted. FROM in flex/extension without crepitus. No popliteal fullness. Neg drawer test. Neg mcmurray test. No pain with valgus/varus stress. No PFgrind. No abnormal patellar mobility.   Skin: Skin is warm and dry. No rash noted.  Psychiatric: He has a normal mood and affect.  Nursing note and vitals reviewed.  Results for orders placed or performed during the hospital encounter of 11/27/16  Basic metabolic panel  Result Value Ref Range   Sodium 138 135 - 145 mmol/L   Potassium 3.8 3.5 - 5.1 mmol/L   Chloride 102 101 - 111 mmol/L   CO2 29 22 - 32 mmol/L   Glucose, Bld 105 (H) 65 - 99 mg/dL   BUN 9 6 - 20 mg/dL   Creatinine, Ser 1.12 0.61 - 1.24 mg/dL   Calcium 9.3 8.9 - 10.3 mg/dL   GFR calc non Af Amer >60 >60 mL/min   GFR calc Af Amer >60 >60 mL/min   Anion gap 7 5 - 15      Assessment & Plan:   Problem List Items Addressed This Visit    Obesity, Class II, BMI 35-39.9, with comorbidity (HCC)    Discussed healthy diet and lifestyle changes to affect sustainable weight loss. Pt remains worried about glycemic control and requests cbg today fasting = 106       Right knee pain - Primary    Overall benign exam. Anticipate R knee pain stemming from  osteoarthritis. Reviewed importance of activity and weight loss. Supportive care reviewed.           Follow up plan: Return if symptoms worsen or fail to improve.  [name], MD

## 2017-02-06 NOTE — Assessment & Plan Note (Signed)
Overall benign exam. Anticipate R knee pain stemming from osteoarthritis. Reviewed importance of activity and weight loss. Supportive care reviewed.

## 2017-04-17 DIAGNOSIS — G5621 Lesion of ulnar nerve, right upper limb: Secondary | ICD-10-CM | POA: Diagnosis not present

## 2017-04-17 DIAGNOSIS — G5622 Lesion of ulnar nerve, left upper limb: Secondary | ICD-10-CM | POA: Diagnosis not present

## 2017-04-22 DIAGNOSIS — M1711 Unilateral primary osteoarthritis, right knee: Secondary | ICD-10-CM | POA: Diagnosis not present

## 2017-04-23 ENCOUNTER — Ambulatory Visit (INDEPENDENT_AMBULATORY_CARE_PROVIDER_SITE_OTHER): Payer: Medicare Other

## 2017-04-23 ENCOUNTER — Other Ambulatory Visit: Payer: Self-pay | Admitting: Family Medicine

## 2017-04-23 VITALS — BP 122/80 | HR 78 | Temp 97.6°F | Ht 67.5 in | Wt 254.5 lb

## 2017-04-23 DIAGNOSIS — R739 Hyperglycemia, unspecified: Secondary | ICD-10-CM | POA: Diagnosis not present

## 2017-04-23 DIAGNOSIS — E559 Vitamin D deficiency, unspecified: Secondary | ICD-10-CM | POA: Diagnosis not present

## 2017-04-23 DIAGNOSIS — Z0000 Encounter for general adult medical examination without abnormal findings: Secondary | ICD-10-CM

## 2017-04-23 DIAGNOSIS — Z125 Encounter for screening for malignant neoplasm of prostate: Secondary | ICD-10-CM

## 2017-04-23 DIAGNOSIS — Z23 Encounter for immunization: Secondary | ICD-10-CM

## 2017-04-23 DIAGNOSIS — E785 Hyperlipidemia, unspecified: Secondary | ICD-10-CM | POA: Diagnosis not present

## 2017-04-23 LAB — BASIC METABOLIC PANEL
BUN: 20 mg/dL (ref 6–23)
CALCIUM: 9.4 mg/dL (ref 8.4–10.5)
CO2: 32 mEq/L (ref 19–32)
CREATININE: 1.16 mg/dL (ref 0.40–1.50)
Chloride: 102 mEq/L (ref 96–112)
GFR: 66.95 mL/min (ref >60.00)
Glucose, Bld: 102 mg/dL — ABNORMAL HIGH (ref 70–99)
Potassium: 4.5 mEq/L (ref 3.5–5.1)
Sodium: 141 mEq/L (ref 135–145)

## 2017-04-23 LAB — LIPID PANEL
CHOLESTEROL: 128 mg/dL (ref 0–200)
HDL: 37.1 mg/dL — AB (ref >39.00)
LDL Cholesterol: 75 mg/dL (ref 0–99)
NonHDL: 90.91
TRIGLYCERIDES: 82 mg/dL (ref 0.0–149.0)
Total CHOL/HDL Ratio: 3
VLDL: 16.4 mg/dL (ref 0.0–40.0)

## 2017-04-23 LAB — HEMOGLOBIN A1C: HEMOGLOBIN A1C: 5.5 % (ref 4.6–6.5)

## 2017-04-23 LAB — PSA, MEDICARE: PSA: 0.22 ng/mL (ref 0.10–4.00)

## 2017-04-23 LAB — VITAMIN D 25 HYDROXY (VIT D DEFICIENCY, FRACTURES): VITD: 27.98 ng/mL — AB (ref 30.00–100.00)

## 2017-04-23 NOTE — Progress Notes (Signed)
Pre visit review using our clinic review tool, if applicable. No additional management support is needed unless otherwise documented below in the visit note. 
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
## 2017-04-23 NOTE — Patient Instructions (Signed)
Mr. Gregory Fisher , Thank you for taking time to come for your Medicare Wellness Visit. I appreciate your ongoing commitment to your health goals. Please review the following plan we discussed and let me know if I can assist you in the future.   These are the goals we discussed: Goals    Starting 04/23/2017, I will continue to take medications as prescribed and to drink at least 6-8 glasses of water daily.       This is a list of the screening recommended for you and due dates:  Health Maintenance  Topic Date Due  . Stool Blood Test  04/30/2017  . DTaP/Tdap/Td vaccine (2 - Td) 01/20/2023  . Tetanus Vaccine  01/20/2023  . Flu Shot  Completed  .  Hepatitis C: One time screening is recommended by Center for Disease Control  (CDC) for  adults born from [birth year] through 1965.   Completed  . Pneumonia vaccines  Completed   Preventive Care for Adults  A healthy lifestyle and preventive care can promote health and wellness. Preventive health guidelines for adults include the following key practices.  . A routine yearly physical is a good way to check with your health care provider about your health and preventive screening. It is a chance to share any concerns and updates on your health and to receive a thorough exam.  . Visit your dentist for a routine exam and preventive care every 6 months. Brush your teeth twice a day and floss once a day. Good oral hygiene prevents tooth decay and gum disease.  . The frequency of eye exams is based on your age, health, family medical history, use  of contact lenses, and other factors. Follow your health care provider's recommendations for frequency of eye exams.  . Eat a healthy diet. Foods like vegetables, fruits, whole grains, low-fat dairy products, and lean protein foods contain the nutrients you need without too many calories. Decrease your intake of foods high in solid fats, added sugars, and salt. Eat the right amount of calories for you. Get information  about a proper diet from your health care provider, if necessary.  . Regular physical exercise is one of the most important things you can do for your health. Most adults should get at least 150 minutes of moderate-intensity exercise (any activity that increases your heart rate and causes you to sweat) each week. In addition, most adults need muscle-strengthening exercises on 2 or more days a week.  Silver Sneakers may be a benefit available to you. To determine eligibility, you may visit the website: www.silversneakers.com or contact program at 3141438336 Mon-Fri between 8AM-8PM.   . Maintain a healthy weight. The body mass index (BMI) is a screening tool to identify possible weight problems. It provides an estimate of body fat based on height and weight. Your health care provider can find your BMI and can help you achieve or maintain a healthy weight.   For adults 20 years and older: ? A BMI below 18.5 is considered underweight. ? A BMI of 18.5 to 24.9 is normal. ? A BMI of 25 to 29.9 is considered overweight. ? A BMI of 30 and above is considered obese.   . Maintain normal blood lipids and cholesterol levels by exercising and minimizing your intake of saturated fat. Eat a balanced diet with plenty of fruit and vegetables. Blood tests for lipids and cholesterol should begin at age 40 and be repeated every 5 years. If your lipid or cholesterol levels are high, you  are over 81, or you are at high risk for heart disease, you may need your cholesterol levels checked more frequently. Ongoing high lipid and cholesterol levels should be treated with medicines if diet and exercise are not working.  . If you smoke, find out from your health care provider how to quit. If you do not use tobacco, please do not start.  . If you choose to drink alcohol, please do not consume more than 2 drinks per day. One drink is considered to be 12 ounces (355 mL) of beer, 5 ounces (148 mL) of wine, or 1.5 ounces (44  mL) of liquor.  . If you are 43-76 years old, ask your health care provider if you should take aspirin to prevent strokes.  . Use sunscreen. Apply sunscreen liberally and repeatedly throughout the day. You should seek shade when your shadow is shorter than you. Protect yourself by wearing long sleeves, pants, a wide-brimmed hat, and sunglasses year round, whenever you are outdoors.  . Once a month, do a whole body skin exam, using a mirror to look at the skin on your back. Tell your health care provider of new moles, moles that have irregular borders, moles that are larger than a pencil eraser, or moles that have changed in shape or color.

## 2017-04-23 NOTE — Progress Notes (Signed)
PCP notes:   Health maintenance:  PPSV23 - administered Flu vaccine - administered  Abnormal screenings:   Depression screen: 18  Hearing - failed  Hearing Screening   125Hz  250Hz  500Hz  1000Hz  2000Hz  3000Hz  4000Hz  6000Hz  8000Hz   Right ear:   0 40 40  0    Left ear:   40 40 40  0      Patient concerns:   Patient is concerned about his belly button which is beginning to protude. Patient will discuss concern with PCP.  Nurse concerns:  None  Next PCP appt:   05/04/17 @ 0830

## 2017-04-25 NOTE — Progress Notes (Signed)
I reviewed health advisor's note, was available for consultation, and agree with documentation and plan.  

## 2017-05-04 ENCOUNTER — Ambulatory Visit (INDEPENDENT_AMBULATORY_CARE_PROVIDER_SITE_OTHER): Payer: Medicare Other | Admitting: Family Medicine

## 2017-05-04 ENCOUNTER — Encounter: Payer: Self-pay | Admitting: Family Medicine

## 2017-05-04 VITALS — BP 122/84 | HR 77 | Temp 97.8°F | Ht 68.0 in | Wt 251.0 lb

## 2017-05-04 DIAGNOSIS — I6529 Occlusion and stenosis of unspecified carotid artery: Secondary | ICD-10-CM | POA: Diagnosis not present

## 2017-05-04 DIAGNOSIS — I712 Thoracic aortic aneurysm, without rupture, unspecified: Secondary | ICD-10-CM

## 2017-05-04 DIAGNOSIS — E559 Vitamin D deficiency, unspecified: Secondary | ICD-10-CM | POA: Diagnosis not present

## 2017-05-04 DIAGNOSIS — M25561 Pain in right knee: Secondary | ICD-10-CM

## 2017-05-04 DIAGNOSIS — Z7189 Other specified counseling: Secondary | ICD-10-CM

## 2017-05-04 DIAGNOSIS — E785 Hyperlipidemia, unspecified: Secondary | ICD-10-CM

## 2017-05-04 DIAGNOSIS — Z1211 Encounter for screening for malignant neoplasm of colon: Secondary | ICD-10-CM

## 2017-05-04 DIAGNOSIS — R739 Hyperglycemia, unspecified: Secondary | ICD-10-CM

## 2017-05-04 DIAGNOSIS — G8929 Other chronic pain: Secondary | ICD-10-CM

## 2017-05-04 DIAGNOSIS — I10 Essential (primary) hypertension: Secondary | ICD-10-CM

## 2017-05-04 DIAGNOSIS — F39 Unspecified mood [affective] disorder: Secondary | ICD-10-CM

## 2017-05-04 DIAGNOSIS — Z0000 Encounter for general adult medical examination without abnormal findings: Secondary | ICD-10-CM | POA: Diagnosis not present

## 2017-05-04 DIAGNOSIS — Z0100 Encounter for examination of eyes and vision without abnormal findings: Secondary | ICD-10-CM

## 2017-05-04 DIAGNOSIS — N400 Enlarged prostate without lower urinary tract symptoms: Secondary | ICD-10-CM

## 2017-05-04 MED ORDER — VITAMIN D3 25 MCG (1000 UT) PO CAPS: 1.0000 | ORAL_CAPSULE | Freq: Every day | ORAL | Status: DC

## 2017-05-04 NOTE — Assessment & Plan Note (Signed)
rec restart vit d 1000 IU daily

## 2017-05-04 NOTE — Assessment & Plan Note (Signed)
No carotid bruits appreciated.

## 2017-05-04 NOTE — Assessment & Plan Note (Signed)
A1c stable

## 2017-05-04 NOTE — Assessment & Plan Note (Signed)
Stable period on PRN lorazepam. Pt declines antidepressant. No SI/HI.

## 2017-05-04 NOTE — Assessment & Plan Note (Signed)
Has established with [facility] discussing visco-supplementation.

## 2017-05-04 NOTE — Assessment & Plan Note (Signed)
Chronic, stable. Continue current regimen. 

## 2017-05-04 NOTE — Progress Notes (Signed)
BP 122/84 (BP Location: Left Arm, Patient Position: Sitting, Cuff Size: Large)   Pulse 77   Temp 97.8 F (36.6 C) (Oral)   Ht 5\' 8"  (1.727 m)   Wt 251 lb (113.9 kg)   SpO2 95%   BMI 38.16 kg/m    CC: CPE Subjective:    Patient ID: Gregory Fisher, male    DOB: [birth year], 66 y.o.   MRN: [id number]  HPI: Gregory Fisher is a 66 y.o. male presenting on 05/04/2017 for No chief complaint on file.   Saw Lesia last week for medicare wellness visit. Note reviewed.  PHQ9 = 18. "Getting better". Denies SI/HI. Declines antidepressant - zoloft and paxil didn't help previously.   Ongoing R knee and elbow pain has seen [facility]. Pending visco-supplementation.   Preventative: Colonoscopy 2003 - iFOB negative yearly since. No fmhx of colon cancer.  Prostate cancer - s/p cryoablation pt endorses due to BPH 2010 ([place]). Has f/u with urology scheduled later this year (05/28/2017).  Flu yearly prevnar 2017, pneumovax 2018 Tdap 01/2013  shingrix - discussed Advanced directive: does not have set up. Would want Jeneen Rinks oldest brother to be HCPOA. Will work on Ecologist. Seat belt use discussed Sunscreen use discussed, no changing moles on skin.  Non smoker Alcohol - none  Lives alone  Occupation: on disability for L shoulder since 07/15/2012  Edu: HS  Activity: no regular exercise - has joined [employer] but hasn't started working out yet  Diet: good water, seldom fruits/vegetables   Relevant past medical, surgical, family and social history reviewed and updated as indicated. Interim medical history since our last visit reviewed. Allergies and medications reviewed and updated. Outpatient Medications Prior to Visit  Medication Sig Dispense Refill  . aspirin EC 81 MG tablet Take 81 mg by mouth daily.     Marland Kitchen atorvastatin (LIPITOR) 80 MG tablet TAKE 1 TABLET BY MOUTH  DAILY 90 tablet 2  . carvedilol (COREG) 12.5 MG tablet TAKE 1 TABLET BY MOUTH  TWICE DAILY WITH A MEAL 180 tablet 2    . cloNIDine (CATAPRES) 0.1 MG tablet TAKE 1 TABLET BY MOUTH TWO  TIMES DAILY 180 tablet 2  . LORazepam (ATIVAN) 1 MG tablet TAKE 1 TABLET BY MOUTH EVERY DAY AS NEEDED 40 tablet 3  . magnesium hydroxide (MILK OF MAGNESIA) 400 MG/5ML suspension Take 5 mL by mouth daily as needed for constipation.    . Naproxen Sodium (ALEVE PO) Take 1 tablet by mouth daily as needed.    . tamsulosin (FLOMAX) 0.4 MG CAPS capsule Take 2 capsules (0.8 mg total) by mouth at bedtime.    . valsartan-hydrochlorothiazide (DIOVAN-HCT) 320-12.5 MG tablet TAKE 1 TABLET BY MOUTH  DAILY 90 tablet 2  . vitamin C (ASCORBIC ACID) 500 MG tablet Take 500 mg by mouth daily.     No facility-administered medications prior to visit.      Per HPI unless specifically indicated in ROS section below Review of Systems  Constitutional: Negative for activity change, appetite change, chills, fatigue, fever and unexpected weight change.  HENT: Negative for hearing loss.   Eyes: Negative for visual disturbance.  Respiratory: Positive for cough (intermittent). Negative for chest tightness, shortness of breath and wheezing.   Cardiovascular: Negative for chest pain, palpitations and leg swelling.  Gastrointestinal: Negative for abdominal distention, abdominal pain, blood in stool, constipation, diarrhea, nausea and vomiting.  Genitourinary: Negative for difficulty urinating and hematuria.  Musculoskeletal: Negative for arthralgias, myalgias and neck pain.  Skin:  Negative for rash.  Neurological: Negative for dizziness, seizures, syncope and headaches.  Hematological: Negative for adenopathy. Does not bruise/bleed easily.  Psychiatric/Behavioral: Negative for dysphoric mood. The patient is not nervous/anxious.        Objective:    BP 122/84 (BP Location: Left Arm, Patient Position: Sitting, Cuff Size: Large)   Pulse 77   Temp 97.8 F (36.6 C) (Oral)   Ht 5\' 8"  (1.727 m)   Wt 251 lb (113.9 kg)   SpO2 95%   BMI 38.16 kg/m   Wt  Readings from Last 3 Encounters:  05/04/17 251 lb (113.9 kg)  04/23/17 254 lb 8 oz (115.4 kg)  02/06/17 249 lb 4 oz (113.1 kg)    Physical Exam  Constitutional: He is oriented to person, place, and time. He appears well-developed and well-nourished. No distress.  HENT:  Head: Normocephalic and atraumatic.  Right Ear: Hearing, tympanic membrane, external ear and ear canal normal.  Left Ear: Hearing, tympanic membrane, external ear and ear canal normal.  Nose: Nose normal.  Mouth/Throat: Uvula is midline, oropharynx is clear and moist and mucous membranes are normal. No oropharyngeal exudate, posterior oropharyngeal edema or posterior oropharyngeal erythema.  Eyes: Conjunctivae and EOM are normal. Pupils are equal, round, and reactive to light. No scleral icterus.  Neck: Normal range of motion. Neck supple. Carotid bruit is not present. No thyromegaly present.  Cardiovascular: Normal rate, regular rhythm, normal heart sounds and intact distal pulses.  No murmur heard. Pulses:      Radial pulses are 2+ on the right side, and 2+ on the left side.  Distant heart sounds  Pulmonary/Chest: Effort normal and breath sounds normal. No respiratory distress. He has no wheezes. He has no rales.  Abdominal: Soft. Bowel sounds are normal. He exhibits no distension and no mass. There is no tenderness. There is no rebound and no guarding. A hernia (small umbilical, reducible) is present.  Central obesity  Musculoskeletal: Normal range of motion. He exhibits no edema.  Lymphadenopathy:    He has no cervical adenopathy.  Neurological: He is alert and oriented to person, place, and time.  CN grossly intact, station and gait intact  Skin: Skin is warm and dry. No rash noted.  Psychiatric: He has a normal mood and affect. His behavior is normal. Judgment and thought content normal.  Nursing note and vitals reviewed.  Results for orders placed or performed in visit on 04/23/17  PSA, Medicare  Result Value  Ref Range   PSA 0.22 0.10 - 4.00 ng/ml  VITAMIN D 25 Hydroxy (Vit-D Deficiency, Fractures)  Result Value Ref Range   VITD 27.98 (L) 30.00 - 100.00 ng/mL  Hemoglobin A1c  Result Value Ref Range   Hgb A1c MFr Bld 5.5 4.6 - 6.5 %  Basic metabolic panel  Result Value Ref Range   Sodium 141 135 - 145 mEq/L   Potassium 4.5 3.5 - 5.1 mEq/L   Chloride 102 96 - 112 mEq/L   CO2 32 19 - 32 mEq/L   Glucose, Bld 102 (H) 70 - 99 mg/dL   BUN 20 6 - 23 mg/dL   Creatinine, Ser 1.16 0.40 - 1.50 mg/dL   Calcium 9.4 8.4 - 10.5 mg/dL   GFR 66.95 >60.00 mL/min  Lipid panel  Result Value Ref Range   Cholesterol 128 0 - 200 mg/dL   Triglycerides 82.0 0.0 - 149.0 mg/dL   HDL 37.10 (L) >39.00 mg/dL   VLDL 16.4 0.0 - 40.0 mg/dL   LDL Cholesterol  75 0 - 99 mg/dL   Total CHOL/HDL Ratio 3    NonHDL 90.91       Assessment & Plan:   Problem List Items Addressed This Visit    Advanced care planning/counseling discussion    Advanced directive: does not have set up. Would want Jeneen Rinks oldest brother to be HCPOA. Will work on Ecologist.      Benign prostatic hyperplasia    Has f/u with urology next month.      Carotid stenosis    No carotid bruits appreciated.       Healthcare maintenance - Primary    Preventative protocols reviewed and updated unless pt declined. Discussed healthy diet and lifestyle.       HLD (hyperlipidemia)    Chronic, stable. Continue current regimen.       HTN (hypertension)    Chronic, stable. Continue current regimen.       Hyperglycemia    A1c stable.      Mood disorder (HCC)    Stable period on PRN lorazepam. Pt declines antidepressant. No SI/HI.       Right knee pain    Has established with [facility] discussing visco-supplementation.       Severe obesity (BMI 35.0-39.9) with comorbidity ([place])    Discussed healthy diet and lifestyle changes to affect sustainable weight loss. Encouraged daily exercise routine.       Thoracic aortic  aneurysm (HCC)   Vitamin D deficiency    rec restart vit d 1000 IU daily        Other Visit Diagnoses    Special screening for malignant neoplasms, colon       Relevant Orders   Fecal occult blood, imunochemical   Encounter for eye exam       Relevant Orders   Ambulatory referral to Ophthalmology       Follow up plan: Return in about 1 year (around 05/04/2018) for annual exam, prior fasting for blood work, medicare wellness visit.  [name], MD

## 2017-05-04 NOTE — Assessment & Plan Note (Signed)
Discussed healthy diet and lifestyle changes to affect sustainable weight loss. Encouraged daily exercise routine.

## 2017-05-04 NOTE — Assessment & Plan Note (Signed)
Preventative protocols reviewed and updated unless pt declined. Discussed healthy diet and lifestyle.  

## 2017-05-04 NOTE — Assessment & Plan Note (Signed)
Advanced directive: does not have set up.Would want James oldest brother to be HCPOA.Willwork onadvanced directive packet.  

## 2017-05-04 NOTE — Patient Instructions (Addendum)
Pass by lab to pick up stool kit.  If interested, check with pharmacy about new 2 shot shingles series (shingrix).  We will refer you to eye doctor for glaucoma screen.  Start vitamin D 1000 units daily.  Labs looking good today.  I want you to start some exercise routine daily - look into non-weight bearing exercise such as stationary bicycle - for goal weight loss.   Health Maintenance, Male A healthy lifestyle and preventive care is important for your health and wellness. Ask your health care provider about what schedule of regular examinations is right for you. What should I know about weight and diet? Eat a Healthy Diet  Eat plenty of vegetables, fruits, whole grains, low-fat dairy products, and lean protein.  Do not eat a lot of foods high in solid fats, added sugars, or salt.  Maintain a Healthy Weight Regular exercise can help you achieve or maintain a healthy weight. You should:  Do at least 150 minutes of exercise each week. The exercise should increase your heart rate and make you sweat (moderate-intensity exercise).  Do strength-training exercises at least twice a week.  Watch Your Levels of Cholesterol and Blood Lipids  Have your blood tested for lipids and cholesterol every 5 years starting at 66 years of age. If you are at high risk for heart disease, you should start having your blood tested when you are 66 years old. You may need to have your cholesterol levels checked more often if: ? Your lipid or cholesterol levels are high. ? You are older than 66 years of age. ? You are at high risk for heart disease.  What should I know about cancer screening? Many types of cancers can be detected early and may often be prevented. Lung Cancer  You should be screened every year for lung cancer if: ? You are a current smoker who has smoked for at least 30 years. ? You are a former smoker who has quit within the past 15 years.  Talk to your health care provider about your  screening options, when you should start screening, and how often you should be screened.  Colorectal Cancer  Routine colorectal cancer screening usually begins at 66 years of age and should be repeated every 5-10 years until you are 66 years old. You may need to be screened more often if early forms of precancerous polyps or small growths are found. Your health care provider may recommend screening at an earlier age if you have risk factors for colon cancer.  Your health care provider may recommend using home test kits to check for hidden blood in the stool.  A small camera at the end of a tube can be used to examine your colon (sigmoidoscopy or colonoscopy). This checks for the earliest forms of colorectal cancer.  Prostate and Testicular Cancer  Depending on your age and overall health, your health care provider may do certain tests to screen for prostate and testicular cancer.  Talk to your health care provider about any symptoms or concerns you have about testicular or prostate cancer.  Skin Cancer  Check your skin from head to toe regularly.  Tell your health care provider about any new moles or changes in moles, especially if: ? There is a change in a mole's size, shape, or color. ? You have a mole that is larger than a pencil eraser.  Always use sunscreen. Apply sunscreen liberally and repeat throughout the day.  Protect yourself by wearing long sleeves, pants,  a wide-brimmed hat, and sunglasses when outside.  What should I know about heart disease, diabetes, and high blood pressure?  If you are 21-41 years of age, have your blood pressure checked every 3-5 years. If you are 50 years of age or older, have your blood pressure checked every year. You should have your blood pressure measured twice-once when you are at a hospital or clinic, and once when you are not at a hospital or clinic. Record the average of the two measurements. To check your blood pressure when you are not at  a hospital or clinic, you can use: ? An automated blood pressure machine at a pharmacy. ? A home blood pressure monitor.  Talk to your health care provider about your target blood pressure.  If you are between 65-3 years old, ask your health care provider if you should take aspirin to prevent heart disease.  Have regular diabetes screenings by checking your fasting blood sugar level. ? If you are at a normal weight and have a low risk for diabetes, have this test once every three years after the age of 74. ? If you are overweight and have a high risk for diabetes, consider being tested at a younger age or more often.  A one-time screening for abdominal aortic aneurysm (AAA) by ultrasound is recommended for men aged 72-75 years who are current or former smokers. What should I know about preventing infection? Hepatitis B If you have a higher risk for hepatitis B, you should be screened for this virus. Talk with your health care provider to find out if you are at risk for hepatitis B infection. Hepatitis C Blood testing is recommended for:  Everyone born from [birth year] through 1965.  Anyone with known risk factors for hepatitis C.  Sexually Transmitted Diseases (STDs)  You should be screened each year for STDs including gonorrhea and chlamydia if: ? You are sexually active and are younger than 66 years of age. ? You are older than 66 years of age and your health care provider tells you that you are at risk for this type of infection. ? Your sexual activity has changed since you were last screened and you are at an increased risk for chlamydia or gonorrhea. Ask your health care provider if you are at risk.  Talk with your health care provider about whether you are at high risk of being infected with HIV. Your health care provider may recommend a prescription medicine to help prevent HIV infection.  What else can I do?  Schedule regular health, dental, and eye exams.  Stay current with  your vaccines (immunizations).  Do not use any tobacco products, such as cigarettes, chewing tobacco, and e-cigarettes. If you need help quitting, ask your health care provider.  Limit alcohol intake to no more than 2 drinks per day. One drink equals 12 ounces of beer, 5 ounces of wine, or 1 ounces of hard liquor.  Do not use street drugs.  Do not share needles.  Ask your health care provider for help if you need support or information about quitting drugs.  Tell your health care provider if you often feel depressed.  Tell your health care provider if you have ever been abused or do not feel safe at home. This information is not intended to replace advice given to you by your health care provider. Make sure you discuss any questions you have with your health care provider. Document Released: 11/22/2007 Document Revised: 01/23/2016 Document Reviewed: 02/27/2015 Elsevier Interactive  Patient Education  [employer].

## 2017-05-04 NOTE — Assessment & Plan Note (Signed)
Has f/u with urology next month.

## 2017-05-13 ENCOUNTER — Other Ambulatory Visit: Payer: Medicare Other

## 2017-05-13 DIAGNOSIS — Z1211 Encounter for screening for malignant neoplasm of colon: Secondary | ICD-10-CM

## 2017-05-13 LAB — FECAL OCCULT BLOOD, IMMUNOCHEMICAL: FECAL OCCULT BLD: NEGATIVE

## 2017-05-13 LAB — FECAL OCCULT BLOOD, GUAIAC: FECAL OCCULT BLD: NEGATIVE

## 2017-05-14 ENCOUNTER — Encounter: Payer: Self-pay | Admitting: Family Medicine

## 2017-05-28 DIAGNOSIS — N3281 Overactive bladder: Secondary | ICD-10-CM | POA: Diagnosis not present

## 2017-05-28 DIAGNOSIS — R350 Frequency of micturition: Secondary | ICD-10-CM | POA: Diagnosis not present

## 2017-05-28 DIAGNOSIS — N401 Enlarged prostate with lower urinary tract symptoms: Secondary | ICD-10-CM | POA: Diagnosis not present

## 2017-06-10 ENCOUNTER — Other Ambulatory Visit: Payer: Self-pay | Admitting: Family Medicine

## 2017-06-11 NOTE — Telephone Encounter (Signed)
Last filled:  04/06/17, #40 Last OV (CPE):  05/04/17 Next OV:  05/05/18

## 2017-06-12 NOTE — Telephone Encounter (Signed)
Sent electronically 

## 2017-06-15 DIAGNOSIS — H2513 Age-related nuclear cataract, bilateral: Secondary | ICD-10-CM | POA: Diagnosis not present

## 2017-06-15 DIAGNOSIS — H40013 Open angle with borderline findings, low risk, bilateral: Secondary | ICD-10-CM | POA: Diagnosis not present

## 2017-06-15 LAB — HM DIABETES EYE EXAM

## 2017-06-16 NOTE — Telephone Encounter (Signed)
Attempted to contact pt. No answer, no vm.  Need to notify pt the Lorazepam rx was sent electronically to [place], per Dr. Darnell Level.

## 2017-06-23 ENCOUNTER — Encounter: Payer: Self-pay | Admitting: Family Medicine

## 2017-06-23 ENCOUNTER — Other Ambulatory Visit: Payer: Self-pay

## 2017-06-23 ENCOUNTER — Ambulatory Visit (INDEPENDENT_AMBULATORY_CARE_PROVIDER_SITE_OTHER): Payer: Medicare Other | Admitting: Family Medicine

## 2017-06-23 VITALS — BP 122/86 | HR 92 | Temp 97.8°F | Ht 67.5 in | Wt 268.2 lb

## 2017-06-23 DIAGNOSIS — R1011 Right upper quadrant pain: Secondary | ICD-10-CM | POA: Diagnosis not present

## 2017-06-23 NOTE — Progress Notes (Signed)
   Subjective:    Patient ID: Gregory Fisher, male    DOB: 01/01/1951, 67 y.o.   MRN: [id number]  HPI  67 year old male pt of Dr. Darnell Level presents with new onset right flank pain.  He reports he has been having pain in RUQ , referred to right midback. 8/10 on pain scale. He has noted intermittently in last 3-4 days.  Starts 30 min to hours after eating.Marland Kitchen  He has tried [employer].Marland Kitchen Helped minimally.  Pain goes away after a few hours after eating.   He has been eating a lot of cheese lately.  Pain starts after eating cheese and greasy foods.  No fever, no nausea, no vomiting.  No diarrhea.  No dysuria.  No blood in stool, some off and on constipation   Blood pressure 122/86, pulse 92, temperature 97.8 F (36.6 C), temperature source Oral, height 5' 7.5" (1.715 m), weight 268 lb 4 oz (121.7 kg).  Review of Systems  Constitutional: Negative for fatigue and fever.  HENT: Negative for ear pain.   Eyes: Negative for pain.  Respiratory: Negative for chest tightness and shortness of breath.   Cardiovascular: Negative for chest pain, palpitations and leg swelling.  Gastrointestinal: Positive for abdominal distention and abdominal pain. Negative for blood in stool, diarrhea, nausea, rectal pain and vomiting.       Objective:   Physical Exam  Constitutional: Vital signs are normal. He appears well-developed and well-nourished.  Morbidly obese, central obesity primarily  HENT:  Head: Normocephalic.  Right Ear: Hearing normal.  Left Ear: Hearing normal.  Nose: Nose normal.  Mouth/Throat: Oropharynx is clear and moist and mucous membranes are normal.  Neck: Trachea normal. Carotid bruit is not present. No thyroid mass and no thyromegaly present.  Cardiovascular: Normal rate, regular rhythm and normal pulses. Exam reveals no gallop, no distant heart sounds and no friction rub.  No murmur heard. No peripheral edema  Pulmonary/Chest: Effort normal and breath sounds normal. No respiratory distress.   Abdominal: There is no hepatosplenomegaly. There is no tenderness. There is no rigidity, no rebound, no guarding and no CVA tenderness. A hernia is present.  No current abd pain Umbilical hernia, reducible and nontender  Skin: Skin is warm, dry and intact. No rash noted.  Psychiatric: He has a normal mood and affect. His speech is normal and behavior is normal. Thought content normal.          Assessment & Plan:

## 2017-06-23 NOTE — Assessment & Plan Note (Signed)
Gallbladder disease vs PUD, pancreatitis etc.   Eval with labs and [place].  Avoid greasy food. Return precautions and ER precautions given.

## 2017-06-23 NOTE — Patient Instructions (Addendum)
Please stop at the lab to have labs drawn. Please stop at the front desk to set up referral.  Decrease cheese and greasy food.  If severe pain, fever or throwing up.. Go to ER.

## 2017-06-24 LAB — CBC WITH DIFFERENTIAL/PLATELET
BASOS ABS: 0.1 10*3/uL (ref 0.0–0.1)
Basophils Relative: 1 % (ref 0.0–3.0)
EOS ABS: 0.4 10*3/uL (ref 0.0–0.7)
EOS PCT: 5 % (ref 0.0–5.0)
HCT: 43.4 % (ref 39.0–52.0)
Hemoglobin: 14.7 g/dL (ref 13.0–17.0)
LYMPHS ABS: 1.1 10*3/uL (ref 0.7–4.0)
Lymphocytes Relative: 14.4 % (ref 12.0–46.0)
MCHC: 33.8 g/dL (ref 30.0–36.0)
MCV: 94 fl (ref 78.0–100.0)
MONO ABS: 0.6 10*3/uL (ref 0.1–1.0)
Monocytes Relative: 8.4 % (ref 3.0–12.0)
NEUTROS PCT: 71.2 % (ref 43.0–77.0)
Neutro Abs: 5.5 10*3/uL (ref 1.4–7.7)
Platelets: 238 10*3/uL (ref 150.0–400.0)
RBC: 4.61 Mil/uL (ref 4.22–5.81)
RDW: 13.1 % (ref 11.5–15.5)
WBC: 7.7 10*3/uL (ref 4.0–10.5)

## 2017-06-24 LAB — COMPREHENSIVE METABOLIC PANEL
ALK PHOS: 101 U/L (ref 39–117)
ALT: 19 U/L (ref 0–53)
AST: 18 U/L (ref 0–37)
Albumin: 4 g/dL (ref 3.5–5.2)
BILIRUBIN TOTAL: 0.7 mg/dL (ref 0.2–1.2)
BUN: 15 mg/dL (ref 6–23)
CALCIUM: 9.5 mg/dL (ref 8.4–10.5)
CO2: 31 mEq/L (ref 19–32)
CREATININE: 0.92 mg/dL (ref 0.40–1.50)
Chloride: 102 mEq/L (ref 96–112)
GFR: 87.44 mL/min (ref >60.00)
Glucose, Bld: 96 mg/dL (ref 70–99)
Potassium: 5 mEq/L (ref 3.5–5.1)
SODIUM: 139 meq/L (ref 135–145)
TOTAL PROTEIN: 7.1 g/dL (ref 6.0–8.3)

## 2017-06-24 LAB — LIPASE: Lipase: 14 U/L (ref 11.0–59.0)

## 2017-06-25 ENCOUNTER — Encounter: Payer: Self-pay | Admitting: *Deleted

## 2017-06-26 ENCOUNTER — Telehealth: Payer: Self-pay | Admitting: *Deleted

## 2017-06-26 ENCOUNTER — Ambulatory Visit
Admission: RE | Admit: 2017-06-26 | Discharge: 2017-06-26 | Disposition: A | Discharge status: Home / Self Care | Payer: Medicare Other | Source: Ambulatory Visit | Attending: Family Medicine | Admitting: Family Medicine

## 2017-06-26 DIAGNOSIS — K7689 Other specified diseases of liver: Secondary | ICD-10-CM | POA: Diagnosis not present

## 2017-06-26 DIAGNOSIS — R1011 Right upper quadrant pain: Secondary | ICD-10-CM | POA: Diagnosis not present

## 2017-06-26 DIAGNOSIS — R932 Abnormal findings on diagnostic imaging of liver and biliary tract: Secondary | ICD-10-CM | POA: Insufficient documentation

## 2017-06-26 MED ORDER — OMEPRAZOLE 40 MG PO CPDR: 40.0000 mg | DELAYED_RELEASE_CAPSULE | Freq: Every day | ORAL | 3 refills | Status: DC

## 2017-06-26 NOTE — Telephone Encounter (Signed)
-----   Message from Jinny Sanders, MD sent at 06/26/2017  5:12 PM EST ----- Notify pt that there is so fat in liver.. Work on low fat diet.  no sign of gallbladder issue.  Symptoms may be from stomach irritation. Start prilosec40 mg daily .Marland Kitchen If not improving by 2 weeks follow up with PCP.

## 2017-06-26 NOTE — Telephone Encounter (Signed)
Mr. Lutze notified as instructed by telephone.  Prilosec 40mg  prescription sent to [facility] in [place] as instructed by Dr. Diona Browner.

## 2017-07-08 ENCOUNTER — Other Ambulatory Visit: Payer: Self-pay | Admitting: Family Medicine

## 2017-07-20 DIAGNOSIS — G5621 Lesion of ulnar nerve, right upper limb: Secondary | ICD-10-CM | POA: Diagnosis not present

## 2017-07-20 DIAGNOSIS — G5622 Lesion of ulnar nerve, left upper limb: Secondary | ICD-10-CM | POA: Diagnosis not present

## 2017-08-10 ENCOUNTER — Other Ambulatory Visit: Payer: Self-pay | Admitting: Family Medicine

## 2017-08-11 NOTE — Telephone Encounter (Signed)
Last filled:  06/12/17, #40 Last OV (CPE):  05/04/17 Next OV:  05/05/18

## 2017-08-13 NOTE — Telephone Encounter (Signed)
Eprescribed.

## 2017-09-11 ENCOUNTER — Encounter: Payer: Self-pay | Admitting: Family Medicine

## 2017-09-11 ENCOUNTER — Ambulatory Visit (INDEPENDENT_AMBULATORY_CARE_PROVIDER_SITE_OTHER): Payer: Medicare Other | Admitting: Family Medicine

## 2017-09-11 VITALS — BP 130/84 | HR 75 | Temp 97.9°F | Wt 266.0 lb

## 2017-09-11 DIAGNOSIS — R7303 Prediabetes: Secondary | ICD-10-CM

## 2017-09-11 DIAGNOSIS — R739 Hyperglycemia, unspecified: Secondary | ICD-10-CM

## 2017-09-11 LAB — POCT GLYCOSYLATED HEMOGLOBIN (HGB A1C): HEMOGLOBIN A1C: 5.8

## 2017-09-11 LAB — POCT GLUCOSE (DEVICE FOR HOME USE): POC GLUCOSE: 117 mg/dL — AB (ref 70–99)

## 2017-09-11 NOTE — Patient Instructions (Addendum)
cbg today.  Work on walking or exercise routine - take advantage of silver sneakers - [facility].

## 2017-09-11 NOTE — Progress Notes (Signed)
BP 130/84 (BP Location: Left Arm, Patient Position: Sitting, Cuff Size: Large)   Pulse 75   Temp 97.9 F (36.6 C) (Oral)   Wt 266 lb (120.7 kg)   SpO2 94%   BMI 41.05 kg/m    CC: DM check Subjective:    Patient ID: Gregory Fisher, male    DOB: 1951-03-04, 67 y.o.   MRN: [id number]  HPI: Gregory Fisher is a 67 y.o. male presenting on 09/11/2017 for Discuss diabetes (Pt wants to make sure he does not have DM.  Concerned due to wt gain and cravings for sweet foods. )   Pt worried about hyperglycemia - weight gain over last several months.  Fasting this morning.  Lab Results  Component Value Date   HGBA1C 5.8 09/11/2017    Worsening arthralgias with lipitor 80mg  - asks about cutting in half.   Relevant past medical, surgical, family and social history reviewed and updated as indicated. Interim medical history since our last visit reviewed. Allergies and medications reviewed and updated. Outpatient Medications Prior to Visit  Medication Sig Dispense Refill  . aspirin EC 81 MG tablet Take 81 mg by mouth daily.     Marland Kitchen atorvastatin (LIPITOR) 80 MG tablet TAKE 1 TABLET BY MOUTH  DAILY 90 tablet 3  . carvedilol (COREG) 12.5 MG tablet TAKE 1 TABLET BY MOUTH  TWICE DAILY WITH A MEAL 180 tablet 3  . Cholecalciferol (VITAMIN D3) 1000 units CAPS Take 1 capsule (1,000 Units total) by mouth daily. 30 capsule   . cloNIDine (CATAPRES) 0.1 MG tablet TAKE 1 TABLET BY MOUTH TWO  TIMES DAILY 180 tablet 3  . LORazepam (ATIVAN) 1 MG tablet TAKE 1 TABLET BY MOUTH EVERY DAY 40 tablet 0  . magnesium hydroxide (MILK OF MAGNESIA) 400 MG/5ML suspension Take 5 mL by mouth daily as needed for constipation.    . Naproxen Sodium (ALEVE PO) Take 1 tablet by mouth daily as needed.    Marland Kitchen omeprazole (PRILOSEC) 40 MG capsule Take 1 capsule (40 mg total) by mouth daily. 30 capsule 3  . tamsulosin (FLOMAX) 0.4 MG CAPS capsule Take 2 capsules (0.8 mg total) by mouth at bedtime.    . valsartan-hydrochlorothiazide  (DIOVAN-HCT) 320-12.5 MG tablet TAKE 1 TABLET BY MOUTH  DAILY 90 tablet 3  . vitamin C (ASCORBIC ACID) 500 MG tablet Take 500 mg by mouth daily.     No facility-administered medications prior to visit.      Per HPI unless specifically indicated in ROS section below Review of Systems     Objective:    BP 130/84 (BP Location: Left Arm, Patient Position: Sitting, Cuff Size: Large)   Pulse 75   Temp 97.9 F (36.6 C) (Oral)   Wt 266 lb (120.7 kg)   SpO2 94%   BMI 41.05 kg/m   Wt Readings from Last 3 Encounters:  09/11/17 266 lb (120.7 kg)  06/23/17 268 lb 4 oz (121.7 kg)  05/04/17 251 lb (113.9 kg)    Physical Exam  Constitutional: He appears well-developed and well-nourished. No distress.  Nursing note and vitals reviewed.  Results for orders placed or performed in visit on 09/11/17  POCT Glucose (Device for Home Use)  Result Value Ref Range   Glucose Fasting, POC  70 - 99 mg/dL   POC Glucose 117 (A) 70 - 99 mg/dl  POCT glycosylated hemoglobin (Hb A1C)  Result Value Ref Range   Hemoglobin A1C 5.8    Lab Results  Component Value Date  CHOL 128 04/23/2017   HDL 37.10 (L) 04/23/2017   LDLCALC 75 04/23/2017   LDLDIRECT 69.8 05/09/2014   TRIG 82.0 04/23/2017   CHOLHDL 3 04/23/2017       Assessment & Plan:   Problem List Items Addressed This Visit    Obesity, morbid, BMI 40.0-49.9 ([place])    Encouraged incorporating exercise into routine. He is considering returning to [facility].  Check cbg today - 117 fasting. Will check POC A1c.       Prediabetes - Primary    Fasting cbg 117. Update A1c today = 5.8. See lab result.           No orders of the defined types were placed in this encounter.  Orders Placed This Encounter  Procedures  . POCT Glucose (Device for Home Use)  . POCT glycosylated hemoglobin (Hb A1C)    Follow up plan: No follow-ups on file.  [name], MD

## 2017-09-11 NOTE — Assessment & Plan Note (Addendum)
Fasting cbg 117. Update A1c today = 5.8. See lab result.

## 2017-09-11 NOTE — Assessment & Plan Note (Signed)
Encouraged incorporating exercise into routine. He is considering returning to [facility].  Check cbg today - 117 fasting. Will check POC A1c.

## 2017-10-15 ENCOUNTER — Other Ambulatory Visit: Payer: Self-pay | Admitting: Family Medicine

## 2017-10-15 NOTE — Telephone Encounter (Signed)
Sent. Thanks.   

## 2017-10-15 NOTE — Telephone Encounter (Signed)
Last Rx 03/07 #40. Last OV 09/11/2017. pls advise

## 2017-10-19 ENCOUNTER — Other Ambulatory Visit: Payer: Self-pay | Admitting: Family Medicine

## 2017-10-28 ENCOUNTER — Other Ambulatory Visit: Payer: Self-pay | Admitting: Family Medicine

## 2017-10-28 NOTE — Telephone Encounter (Signed)
Last filled:  10/28/16, #60 Last OV:  09/11/17 Next OV:  12/14/17

## 2017-10-29 NOTE — Telephone Encounter (Signed)
Eprescribed.

## 2017-11-16 ENCOUNTER — Telehealth: Payer: Self-pay | Admitting: Family Medicine

## 2017-11-16 NOTE — Telephone Encounter (Signed)
Copied from [place] [phone]. Topic: Quick Communication - See Telephone Encounter >> Nov 16, 2017  9:15 AM Conception Chancy, NT wrote: CRM for notification. See Telephone encounter for: 11/16/17.  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 He states he is having burning in his stomach when he eats and would like to know if this is coming from this medication. Please contact patient.

## 2017-11-16 NOTE — Telephone Encounter (Signed)
Copied from [place] [id number]. Topic: Quick Communication - See Telephone Encounter >> Nov 16, 2017  9:15 AM Conception Chancy, NT wrote: CRM for notification. See Telephone encounter for: 11/16/17.  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 He states he is having burning in his stomach when he eats and would like to know if this is coming from this medication. Please contact patient.

## 2017-11-16 NOTE — Telephone Encounter (Signed)
I don't think these symptoms are coming from blood pressure medicine.  There was a recall for valsartan hctz, I would have him check with pharmacy to ensure his medicine/lot # wasn't affected.

## 2017-11-16 NOTE — Telephone Encounter (Signed)
Pt last seen 09/11/17 and has appt scheduled on 12/14/17 for 3 - 4 mth f/u.

## 2017-11-16 NOTE — Telephone Encounter (Signed)
Spoke with pt relaying message per Dr. G. Pt verbalizes understanding. 

## 2017-11-17 NOTE — Telephone Encounter (Signed)
This was already addressed yesterday. Duplicate message?

## 2017-12-07 ENCOUNTER — Other Ambulatory Visit: Payer: Self-pay | Admitting: Family Medicine

## 2017-12-07 NOTE — Telephone Encounter (Signed)
Name of Medication: Lorazepam Name of Pharmacy: [facility] or Written Date and Quantity: 10/15/2017 for #40 Last Office Visit and Type: 09/11/2017 Hyperglycemia Next Office Visit and Type: 12/14/2017 Follow up Last Controlled Substance Agreement Date: 01/16/2014 Last UDS: 01/16/2014

## 2017-12-09 NOTE — Telephone Encounter (Signed)
Eprescribed.

## 2017-12-14 ENCOUNTER — Ambulatory Visit: Payer: Medicare Other | Admitting: Family Medicine

## 2017-12-14 DIAGNOSIS — Z0289 Encounter for other administrative examinations: Secondary | ICD-10-CM

## 2018-01-27 ENCOUNTER — Other Ambulatory Visit: Payer: Self-pay | Admitting: Family Medicine

## 2018-01-27 NOTE — Telephone Encounter (Signed)
Name of Medication:  Lorazepam Name of Pharmacy:  [facility] [place] or Written Date and Quantity: 12/09/17, #40 Last Office Visit and Type: 09/11/17, f/u Next Office Visit and Type: 05/05/18, CPE Last Controlled Substance Agreement Date: 01/16/14 Last UDS: 01/16/14

## 2018-01-29 NOTE — Telephone Encounter (Signed)
Eprescribed.

## 2018-02-10 ENCOUNTER — Other Ambulatory Visit: Payer: Self-pay

## 2018-02-10 NOTE — Patient Outreach (Signed)
[place] [facility]) Care Management  02/10/2018  KIOWA HOLLAR 1950-11-02 [id number]   Medication Adherence call to Mr. Inman Fettig patient did not answer patient is due on Valsartan / Hctz 320/12.5 mg and Atorvastatin 80 mg. voice message not set up to leave messages. Mr. Lobello is showing past due under [place].   [place] [place] Management Direct Dial [phone]  Fax [phone] [email or web address]

## 2018-04-14 ENCOUNTER — Encounter: Payer: Self-pay | Admitting: Family Medicine

## 2018-04-14 ENCOUNTER — Ambulatory Visit (INDEPENDENT_AMBULATORY_CARE_PROVIDER_SITE_OTHER): Payer: Medicare Other | Admitting: Family Medicine

## 2018-04-14 VITALS — BP 128/78 | HR 85 | Temp 98.2°F | Ht 67.5 in | Wt 279.2 lb

## 2018-04-14 DIAGNOSIS — R7303 Prediabetes: Secondary | ICD-10-CM

## 2018-04-14 DIAGNOSIS — Z23 Encounter for immunization: Secondary | ICD-10-CM

## 2018-04-14 DIAGNOSIS — K429 Umbilical hernia without obstruction or gangrene: Secondary | ICD-10-CM

## 2018-04-14 NOTE — Assessment & Plan Note (Signed)
Encouraged avoiding added sugars in diet.  

## 2018-04-14 NOTE — Patient Instructions (Addendum)
Flu shot today Work on establishing exercise routine over the next 3 weeks, goal weight loss.  Hernia looking ok today - watch for pain, nausea or constipation.   Umbilical Hernia, Adult A hernia is a bulge of tissue that pushes through an opening between muscles. An umbilical hernia happens in the abdomen, near the belly button (umbilicus). The hernia may contain tissues from the small intestine, large intestine, or fatty tissue covering the intestines (omentum). Umbilical hernias in adults tend to get worse over time, and they require surgical treatment. There are several types of umbilical hernias. You may have:  A hernia located just above or below the umbilicus (indirect hernia). This is the most common type of umbilical hernia in adults.  A hernia that forms through an opening formed by the umbilicus (direct hernia).  A hernia that comes and goes (reducible hernia). A reducible hernia may be visible only when you strain, lift something heavy, or cough. This type of hernia can be pushed back into the abdomen (reduced).  A hernia that traps abdominal tissue inside the hernia (incarcerated hernia). This type of hernia cannot be reduced.  A hernia that cuts off blood flow to the tissues inside the hernia (strangulated hernia). The tissues can start to die if this happens. This type of hernia requires emergency treatment.  What are the causes? An umbilical hernia happens when tissue inside the abdomen presses on a weak area of the abdominal muscles. What increases the risk? You may have a greater risk of this condition if you:  Are obese.  Have had several pregnancies.  Have a buildup of fluid inside your abdomen (ascites).  Have had surgery that weakens the abdominal muscles.  What are the signs or symptoms? The main symptom of this condition is a painless bulge at or near the belly button. A reducible hernia may be visible only when you strain, lift something heavy, or cough. Other  symptoms may include:  Dull pain.  A feeling of pressure.  Symptoms of a strangulated hernia may include:  Pain that gets increasingly worse.  Nausea and vomiting.  Pain when pressing on the hernia.  Skin over the hernia becoming red or purple.  Constipation.  Blood in the stool.  How is this diagnosed? This condition may be diagnosed based on:  A physical exam. You may be asked to cough or strain while standing. These actions increase the pressure inside your abdomen and force the hernia through the opening in your muscles. Your health care provider may try to reduce the hernia by pressing on it.  Your symptoms and medical history.  How is this treated? Surgery is the only treatment for an umbilical hernia. Surgery for a strangulated hernia is done as soon as possible. If you have a small hernia that is not incarcerated, you may need to lose weight before having surgery. Follow these instructions at home:  Lose weight, if told by your health care provider.  Do not try to push the hernia back in.  Watch your hernia for any changes in color or size. Tell your health care provider if any changes occur.  You may need to avoid activities that increase pressure on your hernia.  Do not lift anything that is heavier than 10 lb (4.5 kg) until your health care provider says that this is safe.  Take over-the-counter and prescription medicines only as told by your health care provider.  Keep all follow-up visits as told by your health care provider. This is  important. Contact a health care provider if:  Your hernia gets larger.  Your hernia becomes painful. Get help right away if:  You develop sudden, severe pain near the area of your hernia.  You have pain as well as nausea or vomiting.  You have pain and the skin over your hernia changes color.  You develop a fever. This information is not intended to replace advice given to you by your health care provider. Make sure  you discuss any questions you have with your health care provider. Document Released: 10/26/2015 Document Revised: 01/27/2016 Document Reviewed: 10/26/2015 Elsevier Interactive Patient Education  [employer].

## 2018-04-14 NOTE — Assessment & Plan Note (Addendum)
Overall stable. Will continue to monitor for now.  Avoid heavy straining, work on weight loss.

## 2018-04-14 NOTE — Progress Notes (Signed)
BP 128/78 (BP Location: Left Arm, Patient Position: Sitting, Cuff Size: Large)   Pulse 85   Temp 98.2 F (36.8 C) (Oral)   Ht 5' 7.5" (1.715 m)   Wt 279 lb 4 oz (126.7 kg)   SpO2 94%   BMI 43.09 kg/m    CC: check hernia Subjective:    Patient ID: Gregory Fisher, male    DOB: 02-19-1951, 67 y.o.   MRN: [id number]  HPI: Gregory Fisher is a 67 y.o. male presenting on 04/14/2018 for Hernia (Concerned about umbilical hernia. Wants it checked. Denies any pain. )   13 lb weight gain noted.  Umbilical hernia present for months. No abd or hernia pain, nausea/vomiting.   Relevant past medical, surgical, family and social history reviewed and updated as indicated. Interim medical history since our last visit reviewed. Allergies and medications reviewed and updated. Outpatient Medications Prior to Visit  Medication Sig Dispense Refill  . aspirin EC 81 MG tablet Take 81 mg by mouth daily.     Marland Kitchen atorvastatin (LIPITOR) 80 MG tablet TAKE 1 TABLET BY MOUTH  DAILY 90 tablet 3  . carvedilol (COREG) 12.5 MG tablet TAKE 1 TABLET BY MOUTH  TWICE DAILY WITH A MEAL 180 tablet 3  . Cholecalciferol (VITAMIN D3) 1000 units CAPS Take 1 capsule (1,000 Units total) by mouth daily. 30 capsule   . cloNIDine (CATAPRES) 0.1 MG tablet TAKE 1 TABLET BY MOUTH TWO  TIMES DAILY 180 tablet 3  . LORazepam (ATIVAN) 1 MG tablet TAKE 1 TABLET BY MOUTH EVERY DAY 40 tablet 1  . magnesium hydroxide (MILK OF MAGNESIA) 400 MG/5ML suspension Take 5 mL by mouth daily as needed for constipation.    . Naproxen Sodium (ALEVE PO) Take 1 tablet by mouth daily as needed.    . tamsulosin (FLOMAX) 0.4 MG CAPS capsule Take 2 capsules (0.8 mg total) by mouth at bedtime.    . traMADol (ULTRAM) 50 MG tablet TAKE 1 TO 2 TABLETS BY MOUTH EVERY 6 HOURS AS NEEDED 60 tablet 0  . valsartan-hydrochlorothiazide (DIOVAN-HCT) 320-12.5 MG tablet TAKE 1 TABLET BY MOUTH  DAILY 90 tablet 3  . vitamin C (ASCORBIC ACID) 500 MG tablet Take 500 mg by  mouth daily.    Marland Kitchen omeprazole (PRILOSEC) 40 MG capsule TAKE 1 CAPSULE BY MOUTH EVERY DAY 90 capsule 1   No facility-administered medications prior to visit.      Per HPI unless specifically indicated in ROS section below Review of Systems     Objective:    BP 128/78 (BP Location: Left Arm, Patient Position: Sitting, Cuff Size: Large)   Pulse 85   Temp 98.2 F (36.8 C) (Oral)   Ht 5' 7.5" (1.715 m)   Wt 279 lb 4 oz (126.7 kg)   SpO2 94%   BMI 43.09 kg/m   Wt Readings from Last 3 Encounters:  04/14/18 279 lb 4 oz (126.7 kg)  09/11/17 266 lb (120.7 kg)  06/23/17 268 lb 4 oz (121.7 kg)    Physical Exam  Constitutional: He appears well-developed and well-nourished. No distress.  HENT:  Mouth/Throat: Oropharynx is clear and moist. No oropharyngeal exudate.  Cardiovascular: Normal rate, regular rhythm and normal heart sounds.  No murmur heard. Pulmonary/Chest: Effort normal and breath sounds normal. No respiratory distress. He has no wheezes. He has no rales.  Abdominal: Soft. Bowel sounds are normal. He exhibits no distension and no mass. There is no hepatosplenomegaly. There is no tenderness. There is no rebound and  no guarding. A hernia (2cm umbilical) is present.  Musculoskeletal: He exhibits no edema.  Psychiatric: He has a normal mood and affect.  Nursing note and vitals reviewed.  Results for orders placed or performed in visit on 09/11/17  POCT Glucose (Device for Home Use)  Result Value Ref Range   Glucose Fasting, POC  70 - 99 mg/dL   POC Glucose 117 (A) 70 - 99 mg/dl  POCT glycosylated hemoglobin (Hb A1C)  Result Value Ref Range   Hemoglobin A1C 5.8       Assessment & Plan:   Problem List Items Addressed This Visit    Umbilical hernia - Primary    Overall stable. Will continue to monitor for now.  Avoid heavy straining, work on weight loss.       Prediabetes    Encouraged avoiding added sugars in diet.       Obesity, morbid, BMI 40.0-49.9 ([place])     Encouraged healthy diet and regular exercise regimen for goal weight loss. Reassess at CPE later this month .       Other Visit Diagnoses    Need for influenza vaccination       Relevant Orders   Flu Vaccine QUAD 36+ mos IM (Completed)       No orders of the defined types were placed in this encounter.  Orders Placed This Encounter  Procedures  . Flu Vaccine QUAD 36+ mos IM    Follow up plan: Return if symptoms worsen or fail to improve.  [name], MD

## 2018-04-14 NOTE — Assessment & Plan Note (Signed)
Encouraged healthy diet and regular exercise regimen for goal weight loss. Reassess at CPE later this month .

## 2018-04-30 ENCOUNTER — Ambulatory Visit (INDEPENDENT_AMBULATORY_CARE_PROVIDER_SITE_OTHER): Payer: Medicare Other

## 2018-04-30 ENCOUNTER — Other Ambulatory Visit: Payer: Self-pay | Admitting: Family Medicine

## 2018-04-30 VITALS — BP 110/78 | HR 78 | Temp 97.8°F | Ht 67.5 in | Wt 283.8 lb

## 2018-04-30 DIAGNOSIS — E559 Vitamin D deficiency, unspecified: Secondary | ICD-10-CM

## 2018-04-30 DIAGNOSIS — Z0000 Encounter for general adult medical examination without abnormal findings: Secondary | ICD-10-CM

## 2018-04-30 DIAGNOSIS — N400 Enlarged prostate without lower urinary tract symptoms: Secondary | ICD-10-CM

## 2018-04-30 DIAGNOSIS — R7303 Prediabetes: Secondary | ICD-10-CM

## 2018-04-30 DIAGNOSIS — E785 Hyperlipidemia, unspecified: Secondary | ICD-10-CM | POA: Diagnosis not present

## 2018-04-30 LAB — COMPREHENSIVE METABOLIC PANEL
ALBUMIN: 3.8 g/dL (ref 3.5–5.2)
ALK PHOS: 102 U/L (ref 39–117)
ALT: 21 U/L (ref 0–53)
AST: 16 U/L (ref 0–37)
BUN: 14 mg/dL (ref 6–23)
CO2: 34 mEq/L — ABNORMAL HIGH (ref 19–32)
CREATININE: 1.04 mg/dL (ref 0.40–1.50)
Calcium: 9.3 mg/dL (ref 8.4–10.5)
Chloride: 103 mEq/L (ref 96–112)
GFR: 75.71 mL/min (ref >60.00)
Glucose, Bld: 137 mg/dL — ABNORMAL HIGH (ref 70–99)
Potassium: 4.8 mEq/L (ref 3.5–5.1)
Sodium: 141 mEq/L (ref 135–145)
TOTAL PROTEIN: 6.2 g/dL (ref 6.0–8.3)
Total Bilirubin: 0.6 mg/dL (ref 0.2–1.2)

## 2018-04-30 LAB — LIPID PANEL
CHOLESTEROL: 121 mg/dL (ref 0–200)
HDL: 35.5 mg/dL — ABNORMAL LOW (ref >39.00)
LDL Cholesterol: 52 mg/dL (ref 0–99)
NonHDL: 85.6
Total CHOL/HDL Ratio: 3
Triglycerides: 168 mg/dL — ABNORMAL HIGH (ref 0.0–149.0)
VLDL: 33.6 mg/dL (ref 0.0–40.0)

## 2018-04-30 LAB — VITAMIN D 25 HYDROXY (VIT D DEFICIENCY, FRACTURES): VITD: 27.46 ng/mL — AB (ref 30.00–100.00)

## 2018-04-30 LAB — PSA: PSA: 0.28 ng/mL (ref 0.10–4.00)

## 2018-04-30 LAB — HEMOGLOBIN A1C: HEMOGLOBIN A1C: 6 % (ref 4.6–6.5)

## 2018-04-30 NOTE — Progress Notes (Signed)
PCP notes:   Health maintenance:  No gaps identified.  Abnormal screenings:   Hearing - failed  Hearing Screening   125Hz  250Hz  500Hz  1000Hz  2000Hz  3000Hz  4000Hz  6000Hz  8000Hz   Right ear:   40 40 40  40    Left ear:   40 40 40  0     Depression score: 9 Depression screen [facility] 2/9 04/30/2018 04/23/2017 04/18/2016 04/17/2015  Decreased Interest 1 2 2  0  Down, Depressed, Hopeless 1 2 1  0  PHQ - 2 Score 2 4 3  0  Altered sleeping 2 3 0 -  Tired, decreased energy 2 3 1  -  Change in appetite 2 3 1  -  Feeling bad or failure about yourself  1 2 0 -  Trouble concentrating 0 2 0 -  Moving slowly or fidgety/restless 0 1 0 -  Suicidal thoughts 0 0 0 -  PHQ-9 Score 9 18 5  -  Difficult doing work/chores Not difficult at all (No Data) Not difficult at all -   Patient concerns:   None  Nurse concerns:  None  Next PCP appt:   05/05/18 @ 1030
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
## 2018-04-30 NOTE — Patient Instructions (Signed)
Gregory Fisher , Thank you for taking time to come for your Medicare Wellness Visit. I appreciate your ongoing commitment to your health goals. Please review the following plan we discussed and let me know if I can assist you in the future.   These are the goals we discussed: Goals    . Healthy Lifestyle     Starting 04/30/2018, I will continue to take medications as prescribed and to drink at least 2-3 16 oz bottles of water daily.        This is a list of the screening recommended for you and due dates:  Health Maintenance  Topic Date Due  . Colon Cancer Screening  04/18/2026*  . Stool Blood Test  05/13/2018  . DTaP/Tdap/Td vaccine (2 - Td) 01/20/2023  . Tetanus Vaccine  01/20/2023  . Flu Shot  Completed  .  Hepatitis C: One time screening is recommended by Center for Disease Control  (CDC) for  adults born from 1 through 1965.   Completed  . Pneumonia vaccines  Completed  *Topic was postponed. The date shown is not the original due date.   Preventive Care for Adults  A healthy lifestyle and preventive care can promote health and wellness. Preventive health guidelines for adults include the following key practices.  . A routine yearly physical is a good way to check with your health care provider about your health and preventive screening. It is a chance to share any concerns and updates on your health and to receive a thorough exam.  . Visit your dentist for a routine exam and preventive care every 6 months. Brush your teeth twice a day and floss once a day. Good oral hygiene prevents tooth decay and gum disease.  . The frequency of eye exams is based on your age, health, family medical history, use  of contact lenses, and other factors. Follow your health care provider's recommendations for frequency of eye exams.  . Eat a healthy diet. Foods like vegetables, fruits, whole grains, low-fat dairy products, and lean protein foods contain the nutrients you need without too many  calories. Decrease your intake of foods high in solid fats, added sugars, and salt. Eat the right amount of calories for you. Get information about a proper diet from your health care provider, if necessary.  . Regular physical exercise is one of the most important things you can do for your health. Most adults should get at least 150 minutes of moderate-intensity exercise (any activity that increases your heart rate and causes you to sweat) each week. In addition, most adults need muscle-strengthening exercises on 2 or more days a week.  Silver Sneakers may be a benefit available to you. To determine eligibility, you may visit the website: www.silversneakers.com or contact program at [phone] Mon-Fri between 8AM-8PM.   . Maintain a healthy weight. The body mass index (BMI) is a screening tool to identify possible weight problems. It provides an estimate of body fat based on height and weight. Your health care provider can find your BMI and can help you achieve or maintain a healthy weight.   For adults 20 years and older: ? A BMI below 18.5 is considered underweight. ? A BMI of 18.5 to 24.9 is normal. ? A BMI of 25 to 29.9 is considered overweight. ? A BMI of 30 and above is considered obese.   . Maintain normal blood lipids and cholesterol levels by exercising and minimizing your intake of saturated fat. Eat a balanced diet with plenty  of fruit and vegetables. Blood tests for lipids and cholesterol should begin at age 78 and be repeated every 5 years. If your lipid or cholesterol levels are high, you are over 50, or you are at high risk for heart disease, you may need your cholesterol levels checked more frequently. Ongoing high lipid and cholesterol levels should be treated with medicines if diet and exercise are not working.  . If you smoke, find out from your health care provider how to quit. If you do not use tobacco, please do not start.  . If you choose to drink alcohol, please do  not consume more than 2 drinks per day. One drink is considered to be 12 ounces (355 mL) of beer, 5 ounces (148 mL) of wine, or 1.5 ounces (44 mL) of liquor.  . If you are 90-84 years old, ask your health care provider if you should take aspirin to prevent strokes.  . Use sunscreen. Apply sunscreen liberally and repeatedly throughout the day. You should seek shade when your shadow is shorter than you. Protect yourself by wearing long sleeves, pants, a wide-brimmed hat, and sunglasses year round, whenever you are outdoors.  . Once a month, do a whole body skin exam, using a mirror to look at the skin on your back. Tell your health care provider of new moles, moles that have irregular borders, moles that are larger than a pencil eraser, or moles that have changed in shape or color.

## 2018-05-05 ENCOUNTER — Ambulatory Visit (INDEPENDENT_AMBULATORY_CARE_PROVIDER_SITE_OTHER): Payer: Medicare Other | Admitting: Family Medicine

## 2018-05-05 ENCOUNTER — Encounter: Payer: Self-pay | Admitting: Family Medicine

## 2018-05-05 VITALS — BP 114/62 | HR 86 | Temp 97.8°F | Ht 67.5 in | Wt 277.0 lb

## 2018-05-05 DIAGNOSIS — Z0000 Encounter for general adult medical examination without abnormal findings: Secondary | ICD-10-CM | POA: Diagnosis not present

## 2018-05-05 DIAGNOSIS — I712 Thoracic aortic aneurysm, without rupture, unspecified: Secondary | ICD-10-CM

## 2018-05-05 DIAGNOSIS — I6529 Occlusion and stenosis of unspecified carotid artery: Secondary | ICD-10-CM

## 2018-05-05 DIAGNOSIS — E785 Hyperlipidemia, unspecified: Secondary | ICD-10-CM

## 2018-05-05 DIAGNOSIS — Z1211 Encounter for screening for malignant neoplasm of colon: Secondary | ICD-10-CM

## 2018-05-05 DIAGNOSIS — I10 Essential (primary) hypertension: Secondary | ICD-10-CM | POA: Diagnosis not present

## 2018-05-05 DIAGNOSIS — Z7189 Other specified counseling: Secondary | ICD-10-CM | POA: Diagnosis not present

## 2018-05-05 DIAGNOSIS — R7303 Prediabetes: Secondary | ICD-10-CM

## 2018-05-05 DIAGNOSIS — E559 Vitamin D deficiency, unspecified: Secondary | ICD-10-CM

## 2018-05-05 DIAGNOSIS — N400 Enlarged prostate without lower urinary tract symptoms: Secondary | ICD-10-CM

## 2018-05-05 DIAGNOSIS — K760 Fatty (change of) liver, not elsewhere classified: Secondary | ICD-10-CM

## 2018-05-05 DIAGNOSIS — F39 Unspecified mood [affective] disorder: Secondary | ICD-10-CM

## 2018-05-05 MED ORDER — LORAZEPAM 1 MG PO TABS: 1.0000 mg | ORAL_TABLET | Freq: Every day | ORAL | 0 refills | Status: DC | PRN

## 2018-05-05 MED ORDER — VITAMIN D3 25 MCG (1000 UT) PO CAPS: 2.0000 | ORAL_CAPSULE | Freq: Every day | ORAL | Status: DC

## 2018-05-05 NOTE — Assessment & Plan Note (Signed)
Reviewed daily antidepressant. Pt declines for now. PHQ9 = 9. Refilled lorazepam, discussed sparing use.

## 2018-05-05 NOTE — Assessment & Plan Note (Signed)
No bruits appreciated today (difficult exam)

## 2018-05-05 NOTE — Assessment & Plan Note (Signed)
Reviewed A1c with patient, encouraged avoiding added sugars in diet.

## 2018-05-05 NOTE — Patient Instructions (Addendum)
Pass by lab to pick up stool kit. Advanced directive packet provided today.  Follow up next month with urology [phone]).  If interested, check with pharmacy about new 2 shot shingles series (shingrix).  Slowly start walking routine.  Return as needed or in 4-6 months for follow up visit.   Health Maintenance, Male A healthy lifestyle and preventive care is important for your health and wellness. Ask your health care provider about what schedule of regular examinations is right for you. What should I know about weight and diet? Eat a Healthy Diet  Eat plenty of vegetables, fruits, whole grains, low-fat dairy products, and lean protein.  Do not eat a lot of foods high in solid fats, added sugars, or salt.  Maintain a Healthy Weight Regular exercise can help you achieve or maintain a healthy weight. You should:  Do at least 150 minutes of exercise each week. The exercise should increase your heart rate and make you sweat (moderate-intensity exercise).  Do strength-training exercises at least twice a week.  Watch Your Levels of Cholesterol and Blood Lipids  Have your blood tested for lipids and cholesterol every 5 years starting at 67 years of age. If you are at high risk for heart disease, you should start having your blood tested when you are 67 years old. You may need to have your cholesterol levels checked more often if: ? Your lipid or cholesterol levels are high. ? You are older than 67 years of age. ? You are at high risk for heart disease.  What should I know about cancer screening? Many types of cancers can be detected early and may often be prevented. Lung Cancer  You should be screened every year for lung cancer if: ? You are a current smoker who has smoked for at least 30 years. ? You are a former smoker who has quit within the past 15 years.  Talk to your health care provider about your screening options, when you should start screening, and how often you should be  screened.  Colorectal Cancer  Routine colorectal cancer screening usually begins at 67 years of age and should be repeated every 5-10 years until you are 66 years old. You may need to be screened more often if early forms of precancerous polyps or small growths are found. Your health care provider may recommend screening at an earlier age if you have risk factors for colon cancer.  Your health care provider may recommend using home test kits to check for hidden blood in the stool.  A small camera at the end of a tube can be used to examine your colon (sigmoidoscopy or colonoscopy). This checks for the earliest forms of colorectal cancer.  Prostate and Testicular Cancer  Depending on your age and overall health, your health care provider may do certain tests to screen for prostate and testicular cancer.  Talk to your health care provider about any symptoms or concerns you have about testicular or prostate cancer.  Skin Cancer  Check your skin from head to toe regularly.  Tell your health care provider about any new moles or changes in moles, especially if: ? There is a change in a mole's size, shape, or color. ? You have a mole that is larger than a pencil eraser.  Always use sunscreen. Apply sunscreen liberally and repeat throughout the day.  Protect yourself by wearing long sleeves, pants, a wide-brimmed hat, and sunglasses when outside.  What should I know about heart disease, diabetes, and high  blood pressure?  If you are 35-73 years of age, have your blood pressure checked every 3-5 years. If you are 62 years of age or older, have your blood pressure checked every year. You should have your blood pressure measured twice-once when you are at a hospital or clinic, and once when you are not at a hospital or clinic. Record the average of the two measurements. To check your blood pressure when you are not at a hospital or clinic, you can use: ? An automated blood pressure machine at a  pharmacy. ? A home blood pressure monitor.  Talk to your health care provider about your target blood pressure.  If you are between 66-17 years old, ask your health care provider if you should take aspirin to prevent heart disease.  Have regular diabetes screenings by checking your fasting blood sugar level. ? If you are at a normal weight and have a low risk for diabetes, have this test once every three years after the age of 39. ? If you are overweight and have a high risk for diabetes, consider being tested at a younger age or more often.  A one-time screening for abdominal aortic aneurysm (AAA) by ultrasound is recommended for men aged 31-75 years who are current or former smokers. What should I know about preventing infection? Hepatitis B If you have a higher risk for hepatitis B, you should be screened for this virus. Talk with your health care provider to find out if you are at risk for hepatitis B infection. Hepatitis C Blood testing is recommended for:  Everyone born from [birth year] through 1965.  Anyone with known risk factors for hepatitis C.  Sexually Transmitted Diseases (STDs)  You should be screened each year for STDs including gonorrhea and chlamydia if: ? You are sexually active and are younger than 67 years of age. ? You are older than 67 years of age and your health care provider tells you that you are at risk for this type of infection. ? Your sexual activity has changed since you were last screened and you are at an increased risk for chlamydia or gonorrhea. Ask your health care provider if you are at risk.  Talk with your health care provider about whether you are at high risk of being infected with HIV. Your health care provider may recommend a prescription medicine to help prevent HIV infection.  What else can I do?  Schedule regular health, dental, and eye exams.  Stay current with your vaccines (immunizations).  Do not use any tobacco products, such as  cigarettes, chewing tobacco, and e-cigarettes. If you need help quitting, ask your health care provider.  Limit alcohol intake to no more than 2 drinks per day. One drink equals 12 ounces of beer, 5 ounces of wine, or 1 ounces of hard liquor.  Do not use street drugs.  Do not share needles.  Ask your health care provider for help if you need support or information about quitting drugs.  Tell your health care provider if you often feel depressed.  Tell your health care provider if you have ever been abused or do not feel safe at home. This information is not intended to replace advice given to you by your health care provider. Make sure you discuss any questions you have with your health care provider. Document Released: 11/22/2007 Document Revised: 01/23/2016 Document Reviewed: 02/27/2015 Elsevier Interactive Patient Education  [employer].

## 2018-05-05 NOTE — Progress Notes (Signed)
BP 114/62 (BP Location: Left Arm, Patient Position: Sitting, Cuff Size: Large)   Pulse 86   Temp 97.8 F (36.6 C) (Oral)   Ht 5' 7.5" (1.715 m)   Wt 277 lb (125.6 kg)   SpO2 92%   BMI 42.74 kg/m    CC: CPE Subjective:    Patient ID: Gregory Fisher, male    DOB: 1951-03-11, 68 y.o.   MRN: [id number]  HPI: Gregory Fisher is a 67 y.o. male presenting on 05/05/2018 for Annual Exam (Pt 2.)   Saw Katha Cabal last week for medicare wellness visit. Note reviewed.   Takes lorazepam PRN sleep and anxiety. Lorazepam started after father's passing 1994. Previously on paxil and zoloft.  PHQ9 = 9 recently.   Preventative: Colonoscopy 2003 - iFOB negative yearly since. No fmhx of colon cancer.  Prostate cancer - s/p cryoablation pt endorses due to BPH 2010 ([place]). Has f/u with urology scheduled later this year (05/28/2017).  Fluyearly prevnar 2017, pneumovax 2018 Tdap 01/2013  shingrix - discussed Advanced directive: does not have set up.Would want Jeneen Rinks oldest brother to be HCPOA.Willwork onadvanced directive packet.  Seat belt use discussed Sunscreen use discussed, no changing moles on skin. Non smoker Alcohol - none Dentist - has false teeth Eye exam yearly   Lives alone  Occupation: on disability for L shoulder since 07/15/2012  Edu: HS  Activity: no regular exercise - has joined [employer] but hasn't started working out yet  Diet: good water, seldom fruits/vegetables  Relevant past medical, surgical, family and social history reviewed and updated as indicated. Interim medical history since our last visit reviewed. Allergies and medications reviewed and updated. Outpatient Medications Prior to Visit  Medication Sig Dispense Refill  . aspirin EC 81 MG tablet Take 81 mg by mouth daily.     Marland Kitchen atorvastatin (LIPITOR) 80 MG tablet TAKE 1 TABLET BY MOUTH  DAILY 90 tablet 3  . carvedilol (COREG) 12.5 MG tablet TAKE 1 TABLET BY MOUTH  TWICE DAILY WITH A MEAL 180 tablet 3  . cloNIDine  (CATAPRES) 0.1 MG tablet TAKE 1 TABLET BY MOUTH TWO  TIMES DAILY 180 tablet 3  . magnesium hydroxide (MILK OF MAGNESIA) 400 MG/5ML suspension Take 5 mL by mouth daily as needed for constipation.    . Naproxen Sodium (ALEVE PO) Take 1 tablet by mouth daily as needed.    . tamsulosin (FLOMAX) 0.4 MG CAPS capsule Take 2 capsules (0.8 mg total) by mouth at bedtime.    . traMADol (ULTRAM) 50 MG tablet TAKE 1 TO 2 TABLETS BY MOUTH EVERY 6 HOURS AS NEEDED 60 tablet 0  . valsartan-hydrochlorothiazide (DIOVAN-HCT) 320-12.5 MG tablet TAKE 1 TABLET BY MOUTH  DAILY 90 tablet 3  . vitamin C (ASCORBIC ACID) 500 MG tablet Take 500 mg by mouth daily.    . Cholecalciferol (VITAMIN D3) 1000 units CAPS Take 1 capsule (1,000 Units total) by mouth daily. 30 capsule   . LORazepam (ATIVAN) 1 MG tablet TAKE 1 TABLET BY MOUTH EVERY DAY 40 tablet 1   No facility-administered medications prior to visit.      Per HPI unless specifically indicated in ROS section below Review of Systems  Constitutional: Negative for activity change, appetite change, chills, fatigue, fever and unexpected weight change.  HENT: Negative for hearing loss.   Eyes: Negative for visual disturbance.  Respiratory: Positive for shortness of breath (with exertion). Negative for cough, chest tightness and wheezing.   Cardiovascular: Negative for chest pain, palpitations and leg swelling.  Gastrointestinal: Negative for abdominal distention, abdominal pain, blood in stool, constipation, diarrhea, nausea and vomiting.  Genitourinary: Negative for difficulty urinating and hematuria.  Musculoskeletal: Negative for arthralgias, myalgias and neck pain.  Skin: Negative for rash.  Neurological: Negative for dizziness, seizures, syncope and headaches.  Hematological: Negative for adenopathy. Does not bruise/bleed easily.  Psychiatric/Behavioral: Negative for dysphoric mood. The patient is not nervous/anxious.       Objective:    BP 114/62 (BP Location:  Left Arm, Patient Position: Sitting, Cuff Size: Large)   Pulse 86   Temp 97.8 F (36.6 C) (Oral)   Ht 5' 7.5" (1.715 m)   Wt 277 lb (125.6 kg)   SpO2 92%   BMI 42.74 kg/m   Wt Readings from Last 3 Encounters:  05/05/18 277 lb (125.6 kg)  04/30/18 283 lb 12 oz (128.7 kg)  04/14/18 279 lb 4 oz (126.7 kg)    Physical Exam  Constitutional: He is oriented to person, place, and time. He appears well-developed and well-nourished. No distress.  HENT:  Head: Normocephalic and atraumatic.  Right Ear: Hearing, tympanic membrane, external ear and ear canal normal.  Left Ear: Hearing, tympanic membrane, external ear and ear canal normal.  Nose: Nose normal.  Mouth/Throat: Uvula is midline, oropharynx is clear and moist and mucous membranes are normal. No oropharyngeal exudate, posterior oropharyngeal edema or posterior oropharyngeal erythema.  Eyes: Pupils are equal, round, and reactive to light. Conjunctivae and EOM are normal. No scleral icterus.  Neck: Normal range of motion. Neck supple. Carotid bruit is not present. No thyromegaly present.  Cardiovascular: Normal rate, regular rhythm, normal heart sounds and intact distal pulses.  No murmur heard. Pulses:      Radial pulses are 2+ on the right side, and 2+ on the left side.  Pulmonary/Chest: Effort normal and breath sounds normal. No respiratory distress. He has no wheezes. He has no rales.  Abdominal: Soft. Bowel sounds are normal. He exhibits no distension and no mass. There is no tenderness. There is no rebound and no guarding.  Musculoskeletal: Normal range of motion. He exhibits no edema.  Lymphadenopathy:    He has no cervical adenopathy.  Neurological: He is alert and oriented to person, place, and time.  CN grossly intact, station and gait intact  Skin: Skin is warm and dry. No rash noted.  Psychiatric: He has a normal mood and affect. His behavior is normal. Judgment and thought content normal.  Nursing note and vitals  reviewed.  Results for orders placed or performed in visit on 04/30/18  VITAMIN D 25 Hydroxy (Vit-D Deficiency, Fractures)  Result Value Ref Range   VITD 27.46 (L) 30.00 - 100.00 ng/mL  PSA  Result Value Ref Range   PSA 0.28 0.10 - 4.00 ng/mL  Hemoglobin A1c  Result Value Ref Range   Hgb A1c MFr Bld 6.0 4.6 - 6.5 %  Lipid panel  Result Value Ref Range   Cholesterol 121 0 - 200 mg/dL   Triglycerides 168.0 (H) 0.0 - 149.0 mg/dL   HDL 35.50 (L) >39.00 mg/dL   VLDL 33.6 0.0 - 40.0 mg/dL   LDL Cholesterol 52 0 - 99 mg/dL   Total CHOL/HDL Ratio 3    NonHDL 85.60   Comprehensive metabolic panel  Result Value Ref Range   Sodium 141 135 - 145 mEq/L   Potassium 4.8 3.5 - 5.1 mEq/L   Chloride 103 96 - 112 mEq/L   CO2 34 (H) 19 - 32 mEq/L   Glucose, Bld  137 (H) 70 - 99 mg/dL   BUN 14 6 - 23 mg/dL   Creatinine, Ser 1.04 0.40 - 1.50 mg/dL   Total Bilirubin 0.6 0.2 - 1.2 mg/dL   Alkaline Phosphatase 102 39 - 117 U/L   AST 16 0 - 37 U/L   ALT 21 0 - 53 U/L   Total Protein 6.2 6.0 - 8.3 g/dL   Albumin 3.8 3.5 - 5.2 g/dL   Calcium 9.3 8.4 - 10.5 mg/dL   GFR 75.71 >60.00 mL/min      Assessment & Plan:   Problem List Items Addressed This Visit    Vitamin D deficiency    Continue vit D 2000 IU daily.       Thoracic aortic aneurysm [facility])    Will discuss updating CTA next visit.       Prediabetes    Reviewed A1c with patient, encouraged avoiding added sugars in diet.       Obesity, morbid, BMI 40.0-49.9 ([place])    Encouraged regular exercise routine - suggested slowly starting walking regimen.       Mood disorder ([place])    Reviewed daily antidepressant. Pt declines for now. PHQ9 = 9. Refilled lorazepam, discussed sparing use.       HTN (hypertension)    Chronic, stable. Continue current regimen.       HLD (hyperlipidemia)    Chronic, stable on statin. Reviewed elevated triglyceride levels this year - rec increase fatty fish in diet.  The ASCVD Risk score Mikey Bussing DC Jr., et  al., 2013) failed to calculate for the following reasons:   The valid total cholesterol range is 130 to 320 mg/dL       Healthcare maintenance - Primary    Preventative protocols reviewed and updated unless pt declined. Discussed healthy diet and lifestyle.       Fatty liver    Suggestion by [place] 06/2017      Carotid stenosis    No bruits appreciated today (difficult exam)      Benign prostatic hyperplasia    Followed by urology on flomax 0.8mg  nightly.      Advanced care planning/counseling discussion    Advanced directive: does not have set up.Would want Jeneen Rinks oldest brother to be HCPOA.Willwork onadvanced directive packet.        Other Visit Diagnoses    Special screening for malignant neoplasms, colon       Relevant Orders   Fecal occult blood, imunochemical       Meds ordered this encounter  Medications  . LORazepam (ATIVAN) 1 MG tablet    Sig: Take 1 tablet (1 mg total) by mouth daily as needed for anxiety.    Dispense:  40 tablet    Refill:  0    Not to exceed 5 additional fills before 06/07/2018.  Marland Kitchen Cholecalciferol (VITAMIN D3) 25 MCG (1000 UT) CAPS    Sig: Take 2 capsules (2,000 Units total) by mouth daily.    Dispense:  30 capsule   Orders Placed This Encounter  Procedures  . Fecal occult blood, imunochemical    Standing Status:   Future    Standing Expiration Date:   05/06/2019    Follow up plan: Return in about 4 months (around 09/03/2018) for follow up visit.  [name], MD

## 2018-05-05 NOTE — Assessment & Plan Note (Signed)
Suggestion by [place] 06/2017

## 2018-05-05 NOTE — Assessment & Plan Note (Signed)
Continue vit D 2000 IU daily.  

## 2018-05-05 NOTE — Assessment & Plan Note (Signed)
Advanced directive: does not have set up.Would want Jeneen Rinks oldest brother to be HCPOA.Willwork onadvanced directive packet.

## 2018-05-05 NOTE — Assessment & Plan Note (Signed)
Preventative protocols reviewed and updated unless pt declined. Discussed healthy diet and lifestyle.  

## 2018-05-05 NOTE — Assessment & Plan Note (Signed)
Encouraged regular exercise routine - suggested slowly starting walking regimen.

## 2018-05-05 NOTE — Assessment & Plan Note (Signed)
Chronic, stable. Continue current regimen. 

## 2018-05-05 NOTE — Assessment & Plan Note (Signed)
Will discuss updating CTA next visit.

## 2018-05-05 NOTE — Assessment & Plan Note (Signed)
Followed by urology on flomax 0.8mg  nightly.

## 2018-05-05 NOTE — Assessment & Plan Note (Signed)
Chronic, stable on statin. Reviewed elevated triglyceride levels this year - rec increase fatty fish in diet.  The ASCVD Risk score Mikey Bussing DC Jr., et al., 2013) failed to calculate for the following reasons:   The valid total cholesterol range is 130 to 320 mg/dL

## 2018-05-10 ENCOUNTER — Other Ambulatory Visit: Payer: Self-pay

## 2018-05-10 NOTE — Patient Outreach (Signed)
[place] [facility]) Care Management  05/10/2018  [name] 07/16/1950 [id number]   Medication Adherence call to Mr. Gregory Fisher left a message for patient to call back patient is due on Valsaratn/HCTZ 320/12.5 mg and Atorvastatin 80 mg. Gregory Fisher is showing past due under [place].   [place] Management Direct Dial [phone]  Fax [phone] [email or web address]

## 2018-05-19 ENCOUNTER — Encounter: Payer: Self-pay | Admitting: Family Medicine

## 2018-05-19 ENCOUNTER — Other Ambulatory Visit (INDEPENDENT_AMBULATORY_CARE_PROVIDER_SITE_OTHER): Payer: Medicare Other

## 2018-05-19 DIAGNOSIS — Z1211 Encounter for screening for malignant neoplasm of colon: Secondary | ICD-10-CM

## 2018-05-19 LAB — FECAL OCCULT BLOOD, IMMUNOCHEMICAL: FECAL OCCULT BLD: NEGATIVE

## 2018-05-19 LAB — FECAL OCCULT BLOOD, GUAIAC: Fecal Occult Blood: NEGATIVE

## 2018-06-06 NOTE — Progress Notes (Signed)
I reviewed health advisor's note, was available for consultation, and agree with documentation and plan.  

## 2018-08-12 ENCOUNTER — Other Ambulatory Visit: Payer: Self-pay | Admitting: Family Medicine

## 2018-08-12 DIAGNOSIS — H2513 Age-related nuclear cataract, bilateral: Secondary | ICD-10-CM | POA: Diagnosis not present

## 2018-08-12 DIAGNOSIS — H524 Presbyopia: Secondary | ICD-10-CM | POA: Diagnosis not present

## 2018-08-12 DIAGNOSIS — H5203 Hypermetropia, bilateral: Secondary | ICD-10-CM | POA: Diagnosis not present

## 2018-08-12 DIAGNOSIS — E119 Type 2 diabetes mellitus without complications: Secondary | ICD-10-CM | POA: Diagnosis not present

## 2018-08-12 DIAGNOSIS — H40013 Open angle with borderline findings, low risk, bilateral: Secondary | ICD-10-CM | POA: Diagnosis not present

## 2018-08-12 LAB — HM DIABETES EYE EXAM

## 2018-08-12 MED ORDER — VALSARTAN-HYDROCHLOROTHIAZIDE 320-12.5 MG PO TABS: 1.0000 | ORAL_TABLET | Freq: Every day | ORAL | 3 refills | Status: DC

## 2018-08-12 MED ORDER — ATORVASTATIN CALCIUM 80 MG PO TABS: 80.0000 mg | ORAL_TABLET | Freq: Every day | ORAL | 3 refills | Status: DC

## 2018-08-12 NOTE — Telephone Encounter (Signed)
Name of Medication: Lorazepam Name of Pharmacy: [facility] Last Fill or Written Date and Quantity: 05/05/18, #40 Last Office Visit and Type: 05/05/18, CPE Pt 2 Next Office Visit and Type: 05/16/19, CPE Pt 2 Last Controlled Substance Agreement Date: 01/17/14 Last UDS: 01/17/14

## 2018-08-13 NOTE — Telephone Encounter (Signed)
Eprescribed.

## 2018-08-16 ENCOUNTER — Encounter: Payer: Self-pay | Admitting: Family Medicine

## 2018-08-18 ENCOUNTER — Encounter: Payer: Self-pay | Admitting: Family Medicine

## 2018-08-23 ENCOUNTER — Telehealth: Payer: Self-pay

## 2018-08-23 NOTE — Telephone Encounter (Signed)
Pt left v/m requesting refills carvedilol, clonidine and tamsulosin. Pt request cb.

## 2018-08-24 MED ORDER — CARVEDILOL 12.5 MG PO TABS: ORAL_TABLET | ORAL | 3 refills | Status: DC

## 2018-08-24 MED ORDER — TAMSULOSIN HCL 0.4 MG PO CAPS: 0.8000 mg | ORAL_CAPSULE | Freq: Every day | ORAL | 1 refills | Status: DC

## 2018-08-24 MED ORDER — CLONIDINE HCL 0.1 MG PO TABS: 0.1000 mg | ORAL_TABLET | Freq: Two times a day (BID) | ORAL | 3 refills | Status: DC

## 2018-08-24 NOTE — Telephone Encounter (Signed)
E-scribed refills.  Notified pt.

## 2018-12-01 DIAGNOSIS — M25511 Pain in right shoulder: Secondary | ICD-10-CM | POA: Diagnosis not present

## 2018-12-01 DIAGNOSIS — M1711 Unilateral primary osteoarthritis, right knee: Secondary | ICD-10-CM | POA: Diagnosis not present

## 2018-12-27 ENCOUNTER — Ambulatory Visit (INDEPENDENT_AMBULATORY_CARE_PROVIDER_SITE_OTHER): Payer: Medicare Other | Admitting: Family Medicine

## 2018-12-27 ENCOUNTER — Other Ambulatory Visit: Payer: Self-pay

## 2018-12-27 ENCOUNTER — Encounter: Payer: Self-pay | Admitting: Family Medicine

## 2018-12-27 VITALS — BP 144/92 | HR 86 | Temp 97.2°F | Ht 67.5 in | Wt 279.7 lb

## 2018-12-27 DIAGNOSIS — I10 Essential (primary) hypertension: Secondary | ICD-10-CM | POA: Diagnosis not present

## 2018-12-27 DIAGNOSIS — R7303 Prediabetes: Secondary | ICD-10-CM

## 2018-12-27 DIAGNOSIS — K760 Fatty (change of) liver, not elsewhere classified: Secondary | ICD-10-CM | POA: Diagnosis not present

## 2018-12-27 DIAGNOSIS — E785 Hyperlipidemia, unspecified: Secondary | ICD-10-CM

## 2018-12-27 DIAGNOSIS — F39 Unspecified mood [affective] disorder: Secondary | ICD-10-CM

## 2018-12-27 LAB — POCT GLYCOSYLATED HEMOGLOBIN (HGB A1C): Hemoglobin A1C: 5.9 % — AB (ref 4.0–5.6)

## 2018-12-27 MED ORDER — LORAZEPAM 1 MG PO TABS: 1.0000 mg | ORAL_TABLET | Freq: Every day | ORAL | 0 refills | Status: DC | PRN

## 2018-12-27 NOTE — Assessment & Plan Note (Signed)
Update A1c ?

## 2018-12-27 NOTE — Assessment & Plan Note (Signed)
With distended abdomen. Continue to monitor, encourage weight loss.

## 2018-12-27 NOTE — Patient Instructions (Addendum)
A1c today.  We will watch blood pressures Work on weight loss through healthy diet and lifestyle changes. Return in 2 months for physical. Lorazepam refilled.  We will refer you to nutritionist.

## 2018-12-27 NOTE — Assessment & Plan Note (Signed)
Lorazepam refilled. Declines daily antidepressant.

## 2018-12-27 NOTE — Assessment & Plan Note (Signed)
He is interested in medication - rec against this given possible side effects. Will refer to nutritionist. Continue to encourage healthy diet and lifestyle changes to affect sustainable weight loss. Pt agrees with plan. RTC 2 mo f/u visit - discuss bariatric medication at that time.

## 2018-12-27 NOTE — Progress Notes (Addendum)
This visit was conducted in person.  BP (!) 144/92 (BP Location: Right Arm, Patient Position: Sitting, Cuff Size: Large)   Pulse 86   Temp (!) 97.2 F (36.2 C) (Temporal)   Ht 5' 7.5" (1.715 m)   Wt 279 lb 11.2 oz (126.9 kg)   SpO2 93%   BMI 43.16 kg/m    CC: 6 mo f/u visit Subjective:    Patient ID: Gregory Fisher, male    DOB: 03/26/1951, 68 y.o.   MRN: [id number]  HPI: STEPHON WEATHERS is a 68 y.o. male presenting on 12/27/2018 for Follow-up (Refills of Lorazepam)   Requests lorazepam refilled today - uses PRN sleep/anxiety. Increased depressed mood since pandemic. Prior on paxil and zoloft but declines return to daily antidepressant. 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   Requests A1c today - prediabetic.  Friend taking phentermine with benefit.  Obesity - affecting umbilical hernia. Activity limited by knee pain. Has seen [facility] recently - had knee effusion that was drained. Consider gel shots into knee.      Relevant past medical, surgical, family and social history reviewed and updated as indicated. Interim medical history since our last visit reviewed. Allergies and medications reviewed and updated. Outpatient Medications Prior to Visit  Medication Sig Dispense Refill  . aspirin EC 81 MG tablet Take 81 mg by mouth daily.     Marland Kitchen atorvastatin (LIPITOR) 80 MG tablet Take 1 tablet (80 mg total) by mouth daily. 90 tablet 3  . carvedilol (COREG) 12.5 MG tablet TAKE 1 TABLET BY MOUTH  TWICE DAILY WITH A MEAL 180 tablet 3  . Cholecalciferol (VITAMIN D3) 25 MCG (1000 UT) CAPS Take 2 capsules (2,000 Units total) by mouth daily. 30 capsule   . cloNIDine (CATAPRES) 0.1 MG tablet Take 1 tablet (0.1 mg total) by mouth 2 (two) times daily. 180 tablet 3  . magnesium hydroxide (MILK OF MAGNESIA) 400 MG/5ML suspension Take 5 mL by mouth daily as needed for constipation.    . Naproxen Sodium (ALEVE PO) Take 1 tablet by mouth daily as needed.    . tamsulosin (FLOMAX) 0.4 MG CAPS capsule  Take 2 capsules (0.8 mg total) by mouth at bedtime. 180 capsule 1  . traMADol (ULTRAM) 50 MG tablet TAKE 1 TO 2 TABLETS BY MOUTH EVERY 6 HOURS AS NEEDED 60 tablet 0  . valsartan-hydrochlorothiazide (DIOVAN-HCT) 320-12.5 MG tablet Take 1 tablet by mouth daily. 90 tablet 3  . vitamin C (ASCORBIC ACID) 500 MG tablet Take 500 mg by mouth daily.    Marland Kitchen LORazepam (ATIVAN) 1 MG tablet TAKE 1 TABLET BY MOUTH  DAILY AS NEEDED FOR  ANXIETY. 40 tablet 0   No facility-administered medications prior to visit.      Per HPI unless specifically indicated in ROS section below Review of Systems Objective:    BP (!) 144/92 (BP Location: Right Arm, Patient Position: Sitting, Cuff Size: Large)   Pulse 86   Temp (!) 97.2 F (36.2 C) (Temporal)   Ht 5' 7.5" (1.715 m)   Wt 279 lb 11.2 oz (126.9 kg)   SpO2 93%   BMI 43.16 kg/m   Wt Readings from Last 3 Encounters:  12/27/18 279 lb 11.2 oz (126.9 kg)  05/05/18 277 lb (125.6 kg)  04/30/18 283 lb 12 oz (128.7 kg)    BP Readings from Last 3 Encounters:  12/27/18 (!) 144/92  05/05/18 114/62  04/30/18 110/78    Physical Exam Vitals signs and nursing note reviewed.  HENT:  Mouth/Throat:     Mouth: Mucous membranes are moist.     Pharynx: No posterior oropharyngeal erythema.  Eyes:     Extraocular Movements: Extraocular movements intact.     Pupils: Pupils are equal, round, and reactive to light.  Cardiovascular:     Rate and Rhythm: Normal rate and regular rhythm.     Pulses: Normal pulses.     Heart sounds: Normal heart sounds. No murmur.  Pulmonary:     Effort: Pulmonary effort is normal. No respiratory distress.     Breath sounds: Normal breath sounds. No wheezing, rhonchi or rales.  Abdominal:     General: Abdomen is protuberant. Bowel sounds are normal. There is distension.     Palpations: Abdomen is soft. There is no mass.     Tenderness: There is no abdominal tenderness. There is no guarding or rebound.     Hernia: A hernia is present.  Hernia is present in the umbilical area.       Results for orders placed or performed in visit on 12/27/18  POCT glycosylated hemoglobin (Hb A1C)  Result Value Ref Range   Hemoglobin A1C 5.9 (A) 4.0 - 5.6 %   HbA1c POC (<> result, manual entry)     HbA1c, POC (prediabetic range)     HbA1c, POC (controlled diabetic range)     Lab Results  Component Value Date   HGBA1C 5.9 (A) 12/27/2018    Assessment & Plan:   Problem List Items Addressed This Visit    Prediabetes - Primary    Update A1c.       Relevant Orders   Amb ref to Medical Nutrition Therapy-[facility]   POCT glycosylated hemoglobin (Hb A1C) (Completed)   Obesity, morbid, BMI 40.0-49.9 ([place])    He is interested in medication - rec against this given possible side effects. Will refer to nutritionist. Continue to encourage healthy diet and lifestyle changes to affect sustainable weight loss. Pt agrees with plan. RTC 2 mo f/u visit - discuss bariatric medication at that time.       Relevant Orders   Amb ref to Medical Nutrition Therapy-[facility]   Mood disorder (HCC)    Lorazepam refilled. Declines daily antidepressant.       HTN (hypertension)    BP mildly elevated today despite compliance with 5 drug regimen. Will continue at this time.       HLD (hyperlipidemia)   Relevant Orders   Amb ref to Medical Nutrition Therapy-[facility]   Fatty liver    With distended abdomen. Continue to monitor, encourage weight loss.       Relevant Orders   Amb ref to Medical Nutrition Therapy-[facility]       Meds ordered this encounter  Medications  . LORazepam (ATIVAN) 1 MG tablet    Sig: Take 1 tablet (1 mg total) by mouth daily as needed. for anxiety    Dispense:  40 tablet    Refill:  0   Orders Placed This Encounter  Procedures  . Amb ref to Medical Nutrition Therapy-[facility]    Referral Priority:   Routine    Referral Type:   Consultation    Referral Reason:   Specialty Services Required    Requested Specialty:   Nutrition    Number of  Visits Requested:   1  . POCT glycosylated hemoglobin (Hb A1C)    Follow up plan: Return in about 2 months (around 02/27/2019) for follow up visit.  [name], MD

## 2018-12-27 NOTE — Assessment & Plan Note (Signed)
BP mildly elevated today despite compliance with 5 drug regimen. Will continue at this time.

## 2018-12-30 ENCOUNTER — Telehealth: Payer: Self-pay

## 2018-12-30 NOTE — Telephone Encounter (Signed)
Patient returned call from the office in regards to his lab results.   C/B # [phone]

## 2018-12-31 NOTE — Telephone Encounter (Signed)
Patient calling back to get lab results  C/B # [phone]

## 2018-12-31 NOTE — Telephone Encounter (Signed)
Patient advised of results.

## 2019-01-21 ENCOUNTER — Other Ambulatory Visit: Payer: Self-pay | Admitting: Family Medicine

## 2019-01-21 NOTE — Telephone Encounter (Signed)
Name of Medication: Lorazepam Name of Pharmacy: [facility] Last Fill or Written Date and Quantity: 08/13/18, #40 Last Office Visit and Type: 12/27/18, f/u Next Office Visit and Type: 02/28/19, 2 mo f/u Last Controlled Substance Agreement Date: 01/17/14 Last UDS: 01/17/14

## 2019-01-21 NOTE — Telephone Encounter (Signed)
Eprescribed.

## 2019-01-26 DIAGNOSIS — M1711 Unilateral primary osteoarthritis, right knee: Secondary | ICD-10-CM | POA: Diagnosis not present

## 2019-02-02 DIAGNOSIS — M1711 Unilateral primary osteoarthritis, right knee: Secondary | ICD-10-CM | POA: Diagnosis not present

## 2019-02-10 DIAGNOSIS — M1711 Unilateral primary osteoarthritis, right knee: Secondary | ICD-10-CM | POA: Diagnosis not present

## 2019-02-28 ENCOUNTER — Other Ambulatory Visit: Payer: Self-pay

## 2019-02-28 ENCOUNTER — Encounter: Payer: Self-pay | Admitting: Family Medicine

## 2019-02-28 ENCOUNTER — Ambulatory Visit (INDEPENDENT_AMBULATORY_CARE_PROVIDER_SITE_OTHER): Payer: Medicare Other | Admitting: Family Medicine

## 2019-02-28 VITALS — BP 140/90 | HR 82 | Temp 97.7°F | Ht 67.5 in | Wt 290.4 lb

## 2019-02-28 DIAGNOSIS — Z23 Encounter for immunization: Secondary | ICD-10-CM | POA: Diagnosis not present

## 2019-02-28 DIAGNOSIS — R7303 Prediabetes: Secondary | ICD-10-CM

## 2019-02-28 DIAGNOSIS — I10 Essential (primary) hypertension: Secondary | ICD-10-CM

## 2019-02-28 DIAGNOSIS — M1711 Unilateral primary osteoarthritis, right knee: Secondary | ICD-10-CM | POA: Diagnosis not present

## 2019-02-28 LAB — POCT GLUCOSE (DEVICE FOR HOME USE): POC Glucose: 115 mg/dl — AB (ref 70–99)

## 2019-02-28 MED ORDER — METFORMIN HCL 500 MG PO TABS: 500.0000 mg | ORAL_TABLET | Freq: Every day | ORAL | 3 refills | Status: DC

## 2019-02-28 NOTE — Assessment & Plan Note (Signed)
Improved but remaining borderline on recheck. Advised to buy BP cuff at home, limit salt intake, and to let me know when running low on valsartan/HCT script to send in higher dose. Pt agrees with plan.

## 2019-02-28 NOTE — Assessment & Plan Note (Signed)
Appreciate ortho care.

## 2019-02-28 NOTE — Assessment & Plan Note (Signed)
A1c by insurance nurse higher than in office last visit (up to 6.4%). reviewed this, he desires to start metformin which is reasonable given weight gain and trend. Reviewed side effects to watch for. Reassess at CPE 05/2019

## 2019-02-28 NOTE — Assessment & Plan Note (Addendum)
Reviewed weight gain noted. Activity limited by knee pain and now hip pain. Consider knee replacement with ortho. Given activity limitations, reviewedd importance of healthy diet changes to attain sustainable weight loss. Interested in starting metformin - see below.

## 2019-02-28 NOTE — Patient Instructions (Addendum)
Flu shot today If interested, check with pharmacy about new 2 shot shingles series (shingrix).  Start metformin 500mg  with breakfast.  Buy new BP cuff to start monitoring at home. Limit salt in the diet. Let me know 2 weeks prior to running out of valsartan hctz to increase dose.  Keep physical appointment for December.

## 2019-02-28 NOTE — Progress Notes (Addendum)
This visit was conducted in person.  BP 140/90 (BP Location: Right Arm, Cuff Size: Large)   Pulse 82   Temp 97.7 F (36.5 C) (Temporal)   Ht 5' 7.5" (1.715 m)   Wt 290 lb 7 oz (131.7 kg)   SpO2 96%   BMI 44.82 kg/m    CC: 2 mo f/u visit Subjective:    Patient ID: Gregory Fisher, male    DOB: Feb 08, 1951, 68 y.o.   MRN: [id number]  HPI: Gregory Fisher is a 68 y.o. male presenting on 02/28/2019 for Follow-up (Here for 2 mo obesity f/u.  Pt provided form (made a copy) from [facility] nurse to show PCP.  Also, pt requests BS tested since he is prediabetic. )   Increased stress recently. Car troubles, pandemic related stress.   Obesity - referred to nutritionist - pt cancelled as unable to do virtual nutritionist visit and medicare would not cover this. Weight gain noted. Obesity affects knee pain (has had 3 gel shots) which is activity limiting. Planned ortho f/u discussing knee replacement.   Prediabetes - home nurse visit with A1c 6.4%. Interested in trial metformin.   HTN - BP elevated today - in pain from knee and hip. Also ate potato chips yesterday. Compliant with current antihypertensive regimen of carvedilol 12.5mg  bid, clonidine 0.1mg  bid, valsartan hctz 320/12.5mg  daily. Does not check blood pressures at home - no BP cuff. No low blood pressure readings or symptoms of dizziness/syncope. Denies HA, vision changes, CP/tightness. Some leg swelling noted and exertional dyspnea.      Relevant past medical, surgical, family and social history reviewed and updated as indicated. Interim medical history since our last visit reviewed. Allergies and medications reviewed and updated. Outpatient Medications Prior to Visit  Medication Sig Dispense Refill  . aspirin EC 81 MG tablet Take 81 mg by mouth daily.     Marland Kitchen atorvastatin (LIPITOR) 80 MG tablet Take 1 tablet (80 mg total) by mouth daily. 90 tablet 3  . carvedilol (COREG) 12.5 MG tablet TAKE 1 TABLET BY MOUTH  TWICE DAILY WITH A MEAL 180  tablet 3  . Cholecalciferol (VITAMIN D3) 25 MCG (1000 UT) CAPS Take 2 capsules (2,000 Units total) by mouth daily. 30 capsule   . cloNIDine (CATAPRES) 0.1 MG tablet Take 1 tablet (0.1 mg total) by mouth 2 (two) times daily. 180 tablet 3  . LORazepam (ATIVAN) 1 MG tablet TAKE 1 TABLET BY MOUTH  DAILY AS NEEDED FOR ANXIETY 40 tablet 0  . magnesium hydroxide (MILK OF MAGNESIA) 400 MG/5ML suspension Take 5 mL by mouth daily as needed for constipation.    . Naproxen Sodium (ALEVE PO) Take 1 tablet by mouth daily as needed.    . tamsulosin (FLOMAX) 0.4 MG CAPS capsule TAKE 2 CAPSULES BY MOUTH AT BEDTIME 180 capsule 0  . traMADol (ULTRAM) 50 MG tablet TAKE 1 TO 2 TABLETS BY MOUTH EVERY 6 HOURS AS NEEDED 60 tablet 0  . valsartan-hydrochlorothiazide (DIOVAN-HCT) 320-12.5 MG tablet Take 1 tablet by mouth daily. 90 tablet 3  . vitamin C (ASCORBIC ACID) 500 MG tablet Take 500 mg by mouth daily.     No facility-administered medications prior to visit.      Per HPI unless specifically indicated in ROS section below Review of Systems Objective:    BP 140/90 (BP Location: Right Arm, Cuff Size: Large)   Pulse 82   Temp 97.7 F (36.5 C) (Temporal)   Ht 5' 7.5" (1.715 m)   Wt 290  lb 7 oz (131.7 kg)   SpO2 96%   BMI 44.82 kg/m   Wt Readings from Last 3 Encounters:  02/28/19 290 lb 7 oz (131.7 kg)  12/27/18 279 lb 11.2 oz (126.9 kg)  05/05/18 277 lb (125.6 kg)    Physical Exam Vitals signs and nursing note reviewed.  Constitutional:      General: He is not in acute distress.    Appearance: Normal appearance. He is obese. He is not ill-appearing.  HENT:     Head: Normocephalic and atraumatic.     Mouth/Throat:     Mouth: Mucous membranes are dry.     Pharynx: Oropharynx is clear.     Comments: Dentures in place Eyes:     Extraocular Movements: Extraocular movements intact.     Pupils: Pupils are equal, round, and reactive to light.  Cardiovascular:     Rate and Rhythm: Normal rate and  regular rhythm.     Pulses: Normal pulses.     Heart sounds: Normal heart sounds. No murmur.  Musculoskeletal:     Right lower leg: Edema (1+ pitting) present.     Left lower leg: Edema (1+ pitting) present.  Skin:    General: Skin is warm.     Findings: No erythema or rash.  Neurological:     Mental Status: He is alert.  Psychiatric:        Mood and Affect: Mood normal.        Behavior: Behavior normal.       Results for orders placed or performed in visit on 02/28/19  POCT Glucose (Device for Home Use)  Result Value Ref Range   Glucose Fasting, POC     POC Glucose 115 (A) 70 - 99 mg/dl   Lab Results  Component Value Date   HGBA1C 5.9 (A) 12/27/2018    Assessment & Plan:   Problem List Items Addressed This Visit    Primary osteoarthritis of right knee    Appreciate ortho care.       Prediabetes - Primary    A1c by insurance nurse higher than in office last visit (up to 6.4%). reviewed this, he desires to start metformin which is reasonable given weight gain and trend. Reviewed side effects to watch for. Reassess at CPE 05/2019      Relevant Orders   POCT Glucose (Device for Home Use) (Completed)   Obesity, morbid, BMI 40.0-49.9 ([place])    Reviewed weight gain noted. Activity limited by knee pain and now hip pain. Consider knee replacement with ortho. Given activity limitations, reviewedd importance of healthy diet changes to attain sustainable weight loss. Interested in starting metformin - see below.       Relevant Medications   metFORMIN (GLUCOPHAGE) 500 MG tablet   HTN (hypertension)    Improved but remaining borderline on recheck. Advised to buy BP cuff at home, limit salt intake, and to let me know when running low on valsartan/HCT script to send in higher dose. Pt agrees with plan.        Other Visit Diagnoses    Need for influenza vaccination       Relevant Orders   Flu Vaccine QUAD High Dose(Fluad) (Completed)       Meds ordered this encounter   Medications  . metFORMIN (GLUCOPHAGE) 500 MG tablet    Sig: Take 1 tablet (500 mg total) by mouth daily with breakfast.    Dispense:  90 tablet    Refill:  3   Orders Placed  This Encounter  Procedures  . Flu Vaccine QUAD High Dose(Fluad)  . POCT Glucose (Device for Home Use)    Follow up plan: Return if symptoms worsen or fail to improve.  [name], MD

## 2019-03-11 ENCOUNTER — Other Ambulatory Visit: Payer: Self-pay | Admitting: Family Medicine

## 2019-03-28 DIAGNOSIS — M1711 Unilateral primary osteoarthritis, right knee: Secondary | ICD-10-CM | POA: Diagnosis not present

## 2019-04-14 ENCOUNTER — Other Ambulatory Visit: Payer: Self-pay | Admitting: Family Medicine

## 2019-05-12 ENCOUNTER — Ambulatory Visit: Payer: Medicare Other

## 2019-05-12 ENCOUNTER — Other Ambulatory Visit (INDEPENDENT_AMBULATORY_CARE_PROVIDER_SITE_OTHER): Payer: Medicare Other

## 2019-05-12 ENCOUNTER — Other Ambulatory Visit: Payer: Self-pay

## 2019-05-12 ENCOUNTER — Other Ambulatory Visit: Payer: Self-pay | Admitting: Family Medicine

## 2019-05-12 DIAGNOSIS — E559 Vitamin D deficiency, unspecified: Secondary | ICD-10-CM | POA: Diagnosis not present

## 2019-05-12 DIAGNOSIS — E785 Hyperlipidemia, unspecified: Secondary | ICD-10-CM

## 2019-05-12 DIAGNOSIS — R7303 Prediabetes: Secondary | ICD-10-CM | POA: Diagnosis not present

## 2019-05-12 DIAGNOSIS — Z125 Encounter for screening for malignant neoplasm of prostate: Secondary | ICD-10-CM

## 2019-05-12 LAB — COMPREHENSIVE METABOLIC PANEL
ALT: 23 U/L (ref 0–53)
AST: 18 U/L (ref 0–37)
Albumin: 4.2 g/dL (ref 3.5–5.2)
Alkaline Phosphatase: 102 U/L (ref 39–117)
BUN: 10 mg/dL (ref 6–23)
CO2: 33 mEq/L — ABNORMAL HIGH (ref 19–32)
Calcium: 9.5 mg/dL (ref 8.4–10.5)
Chloride: 100 mEq/L (ref 96–112)
Creatinine, Ser: 1.02 mg/dL (ref 0.40–1.50)
GFR: 72.62 mL/min (ref >60.00)
Glucose, Bld: 147 mg/dL — ABNORMAL HIGH (ref 70–99)
Potassium: 4.4 mEq/L (ref 3.5–5.1)
Sodium: 141 mEq/L (ref 135–145)
Total Bilirubin: 0.7 mg/dL (ref 0.2–1.2)
Total Protein: 6.6 g/dL (ref 6.0–8.3)

## 2019-05-12 LAB — PSA: PSA: 0.25 ng/mL (ref 0.10–4.00)

## 2019-05-12 LAB — LIPID PANEL
Cholesterol: 128 mg/dL (ref 0–200)
HDL: 39.8 mg/dL (ref >39.00)
LDL Cholesterol: 67 mg/dL (ref 0–99)
NonHDL: 88.11
Total CHOL/HDL Ratio: 3
Triglycerides: 106 mg/dL (ref 0.0–149.0)
VLDL: 21.2 mg/dL (ref 0.0–40.0)

## 2019-05-12 LAB — VITAMIN D 25 HYDROXY (VIT D DEFICIENCY, FRACTURES): VITD: 51.96 ng/mL (ref 30.00–100.00)

## 2019-05-12 LAB — HEMOGLOBIN A1C: Hgb A1c MFr Bld: 6.3 % (ref 4.6–6.5)

## 2019-05-16 ENCOUNTER — Telehealth: Payer: Self-pay | Admitting: *Deleted

## 2019-05-16 ENCOUNTER — Encounter: Payer: Medicare Other | Admitting: Family Medicine

## 2019-05-16 NOTE — Telephone Encounter (Signed)
Patient called stating that he called last night to find out if he should come for his physical today, but the office was closed.  Patient stated that his friend was diagnosed with couple a couple of weeks ago and now he is having some symptoms. Patient stated that his eyes are watering and he has fatigue. Patient stated that he has some SOB, but that is nothing new. Patient stated that he is out of shape. Patient was advised that he should go be tested for covid and the locations were given. ER precautions given to patient.

## 2019-05-16 NOTE — Telephone Encounter (Signed)
[place] Night - Client TELEPHONE ADVICE RECORD [facility] Patient Name: Gregory Fisher Gender: Male DOB: [birth year] Age: 68 Y 25 D Return Phone Number: [phone] (Primary), [phone] (Secondary) Address: City/State/Zip: [place] Client [place] Night - Client Client Site [place] Physician [name] - MD Contact Type Call Who Is Calling Patient / Member / Family / Caregiver Call Type Triage / Clinical Relationship To Patient Self Return Phone Number [phone] (Primary) Chief Complaint Fatigue (>THREE MONTHS) Reason for Call Symptomatic / Request for [place] is supposed to have a physical today, and he missed a call from the office. Caller feels fatigue. He has been around someone who was positive for A strand (tested blood). Translation No Nurse Assessment Nurse: Jac Canavan, RN, Estill Bamberg Date/Time (Eastern Time): 05/16/2019 5:33:21 AM Confirm and document reason for call. If symptomatic, describe symptoms. ---Caller states he is supposed to have a physical today. His friend that he has been around has been positive for A strand?? He is unable to tell me more except that it is a blood test. He has been feeling fatigued. He was tested for COVID but never got the results from a couple of weeks ago. He would like to cancel his appointment. Has the patient had close contact with a person known or suspected to have the novel coronavirus illness OR traveled / lives in area with major community spread (including international travel) in the last 14 days from the onset of symptoms? * If Asymptomatic, screen for exposure and travel within the last 14 days. ---No Does the patient have any new or worsening symptoms? ---Yes Will a triage be completed? ---Yes Related visit to physician within the last 2 weeks? ---No Does the PT have any chronic conditions?  (i.e. diabetes, asthma, this includes High risk factors for pregnancy, etc.) ---Yes List chronic conditions. ---pre diabetes, obesity Is this a behavioral health or substance abuse call? ---No Guidelines Guideline Title Affirmed Question Affirmed Notes Nurse Date/Time (Eastern Time) Weakness (Generalized) and Fatigue Taking a medicine that could cause weakness Jac Canavan, RN, Estill Bamberg 05/16/2019 5:37:42 AM PLEASE NOTE: All timestamps contained within this report are represented as [place] Standard Time. CONFIDENTIALTY NOTICE: This fax transmission is intended only for the addressee. It contains information that is legally privileged, confidential or otherwise protected from use or disclosure. If you are not the intended recipient, you are strictly prohibited from reviewing, disclosing, copying using or disseminating any of this information or taking any action in reliance on or regarding this information. If you have received this fax in error, please notify us immediately by telephone so that we can arrange for its return to [place]. Phone: [phone], Toll-Free: [phone], Fax: [phone] Page: 2 of 2 Call Id: [id number] Guidelines Guideline Title Affirmed Question Affirmed Notes Nurse Date/Time Eilene Ghazi Time) (e.g., blood pressure medications, diuretics) Disp. Time Eilene Ghazi Time) Disposition Final User 05/16/2019 5:42:42 AM See PCP within 24 Hours Yes Jac Canavan, RN, Shelly Coss Disagree/Comply Comply Caller Understands Yes PreDisposition Call Doctor Care Advice Given Per Guideline SEE PCP WITHIN 24 HOURS: * IF OFFICE WILL BE OPEN: You need to be seen within the next 24 hours. Call your doctor (or NP/PA) when the office opens and make an appointment. CARE ADVICE given per Weakness and Fatigue (Adult) guideline. * You become worse. CALL BACK IF: Referrals REFERRED TO PCP OFFICE

## 2019-05-16 NOTE — Telephone Encounter (Signed)
Noted. Exposed to + covid pt, now with symptoms. Agree with having him tested and rescheduling apt. plz call Wed for update on symptoms.

## 2019-05-17 NOTE — Telephone Encounter (Signed)
Noted.  Pt added to Call Log.  

## 2019-05-18 NOTE — Telephone Encounter (Signed)
Lvm asking pt to call back.  Need an update on his sxs.

## 2019-05-18 NOTE — Telephone Encounter (Signed)
Lvm informing pt I will call him back tomorrow.

## 2019-05-18 NOTE — Telephone Encounter (Signed)
Was he tested and if so what was result? When was last time he was around exposed friend? Should be able to schedule CPE 2 wks after date of last exposure assuming negative test and improving symptoms.  Blood work all returned ok, sugar remains in prediabetes range.

## 2019-05-18 NOTE — Telephone Encounter (Signed)
Pt returning call.  States he's feeling better but still a little fatigued but better after eating something.  Also, has watery eyes.   Also, pt is asking about lab results.

## 2019-05-19 NOTE — Telephone Encounter (Signed)
Spoke with pt asking Dr. Synthia Innocent questions.  Pt state he was tested at [facility] 2-3 wks ago and has not received results.  Pt lives with [place] pos friend.  Pt will contact [facility] to get results and have them fax info to Dr. Darnell Level.   As of now, pt has AWV scheduled on 05/31/19.  Fyi to Dr. Darnell Level.

## 2019-05-26 ENCOUNTER — Telehealth: Payer: Self-pay | Admitting: Family Medicine

## 2019-05-26 NOTE — Telephone Encounter (Signed)
Lvm for pt to call back.  Need to provide COVID scheduling phn # [phone], website:  [email or web address] or pt can text COVID to [phone] to schedule test.

## 2019-05-26 NOTE — Telephone Encounter (Signed)
Spoke with patient today who will be cancelling his appointment on 12/22  He stated that he was around someone who had covid and has been feeling sluggish and not well. Patient stated that he has been trying to get an appointment to be tested but everywhere he has called that stated he can not be tested, they do not have appointments right now,. Advised of testing at the hospitals and how he can get an appointment with them and the patient stated that he has already tried and they are full.   Patient would like some help with this and getting scheduled.  He is not sure what he should do next but needed to be tested since his friend has been tested positive

## 2019-05-26 NOTE — Telephone Encounter (Signed)
plz schedule pt for testing through [email or web address] website. There are several appts for tomorrow.

## 2019-05-27 NOTE — Telephone Encounter (Signed)
Spoke with pt, states he is scheduled at [facility] at 1:00 today to be tested.

## 2019-05-27 NOTE — Telephone Encounter (Signed)
Patient was connected to the phone number to schedule for testing.  He did not have a pen at that time and could not take the website address.

## 2019-05-27 NOTE — Telephone Encounter (Signed)
[place] Night - Client Nonclinical Telephone Record [facility] Client [place] Night - Client Client Site [place] Physician [name] - MD Contact Type Call Who Is Calling Patient / Member / Family / Caregiver Caller Name [place] Phone Number [phone] Call Type Message Only Information Provided Reason for Call Returning a Call from the Office Initial Gibson states he just missed a call from the office. Additional Comment Disp. Time Disposition Final User 05/26/2019 5:03:19 PM General Information Provided Yes Spell, Olivia Mackie

## 2019-05-30 ENCOUNTER — Other Ambulatory Visit: Payer: Self-pay

## 2019-05-30 ENCOUNTER — Ambulatory Visit: Payer: Medicare Other | Attending: Internal Medicine

## 2019-05-30 DIAGNOSIS — Z20822 Contact with and (suspected) exposure to covid-19: Secondary | ICD-10-CM

## 2019-05-31 ENCOUNTER — Ambulatory Visit: Payer: Medicare Other | Admitting: Family Medicine

## 2019-05-31 LAB — NOVEL CORONAVIRUS, NAA: SARS-CoV-2, NAA: NOT DETECTED

## 2019-06-01 ENCOUNTER — Telehealth: Payer: Self-pay | Admitting: Family Medicine

## 2019-06-01 ENCOUNTER — Other Ambulatory Visit: Payer: Self-pay

## 2019-06-01 NOTE — Patient Outreach (Signed)
[facility]) Care Management  06/01/2019  ADEM GAMM [birth year] [id number]   Medication Adherence call to Mr. Gregory Fisher Telephone call to Patient regarding Medication Adherence unable to reach patient. [name] is showing past due on Metformin 500 mg under [place].   [place] Management Direct Dial [phone]  Fax [phone] [email or web address]

## 2019-06-01 NOTE — Telephone Encounter (Signed)
Noted. I'm glad. I think we rescheduled his physical for 07/2019.

## 2019-06-01 NOTE — Telephone Encounter (Signed)
Patient wanted Gregory Fisher know that he was tested for covid and it was negative.

## 2019-06-02 ENCOUNTER — Other Ambulatory Visit: Payer: Self-pay | Admitting: Family Medicine

## 2019-06-03 ENCOUNTER — Other Ambulatory Visit: Payer: Self-pay | Admitting: Family Medicine

## 2019-06-06 ENCOUNTER — Other Ambulatory Visit: Payer: Self-pay

## 2019-06-06 NOTE — Patient Outreach (Signed)
[facility]) Care Management  06/06/2019  Gregory Fisher 1950/07/26 [id number]   Medication Adherence call to Gregory Fisher [place] Identifiers Verify spoke with patient he is past due on Metformin 500 mg,patient explain he is taking 1 tablet daily,patient ask to call [facility] to order this medication, [facility] will mail out with in 5-7 days,patient has enough until he receives it. Gregory Fisher  Is showing past due under [place].   [place] Management Direct Dial [phone]  Fax [phone] [email or web address]

## 2019-06-23 ENCOUNTER — Other Ambulatory Visit: Payer: Self-pay

## 2019-06-23 ENCOUNTER — Ambulatory Visit (INDEPENDENT_AMBULATORY_CARE_PROVIDER_SITE_OTHER): Payer: Medicare Other | Admitting: Family Medicine

## 2019-06-23 ENCOUNTER — Encounter: Payer: Self-pay | Admitting: Family Medicine

## 2019-06-23 VITALS — BP 140/84 | HR 91 | Temp 98.3°F | Ht 67.5 in | Wt 283.4 lb

## 2019-06-23 DIAGNOSIS — I77810 Thoracic aortic ectasia: Secondary | ICD-10-CM

## 2019-06-23 DIAGNOSIS — E118 Type 2 diabetes mellitus with unspecified complications: Secondary | ICD-10-CM | POA: Diagnosis not present

## 2019-06-23 DIAGNOSIS — M1711 Unilateral primary osteoarthritis, right knee: Secondary | ICD-10-CM

## 2019-06-23 LAB — POCT GLUCOSE (DEVICE FOR HOME USE): POC Glucose: 114 mg/dl — AB (ref 70–99)

## 2019-06-23 MED ORDER — METFORMIN HCL 500 MG PO TABS: 500.0000 mg | ORAL_TABLET | Freq: Two times a day (BID) | ORAL | 3 refills | Status: DC

## 2019-06-23 MED ORDER — B-12 1000 MCG SL SUBL: 1.0000 | SUBLINGUAL_TABLET | Freq: Every day | SUBLINGUAL | Status: DC

## 2019-06-23 NOTE — Patient Instructions (Addendum)
Diabetes diet handout provided today. Increase metformin to 500mg  twice daily.  Check with insurance on preferred sugar meter brand and let me know.  Sugar check today.  Start vitamin B12 1053mcg daily, dissolvable tablets if you can find.  Return in 2 weeks for physical.   Prediabetes Eating Plan Prediabetes is a condition that causes blood sugar (glucose) levels to be higher than normal. This increases the risk for developing diabetes. In order to prevent diabetes from developing, your health care provider may recommend a diet and other lifestyle changes to help you:  Control your blood glucose levels.  Improve your cholesterol levels.  Manage your blood pressure. Your health care provider may recommend working with a diet and nutrition specialist (dietitian) to make a meal plan that is best for you. What are tips for following this plan? Lifestyle  Set weight loss goals with the help of your health care team. It is recommended that most people with prediabetes lose 7% of their current body weight.  Exercise for at least 30 minutes at least 5 days a week.  Attend a support group or seek ongoing support from a mental health counselor.  Take over-the-counter and prescription medicines only as told by your health care provider. Reading food labels  Read food labels to check the amount of fat, salt (sodium), and sugar in prepackaged foods. Avoid foods that have: ? Saturated fats. ? Trans fats. ? Added sugars.  Avoid foods that have more than 300 milligrams (mg) of sodium per serving. Limit your daily sodium intake to less than 2,300 mg each day. Shopping  Avoid buying pre-made and processed foods. Cooking  Cook with olive oil. Do not use butter, lard, or ghee.  Bake, broil, grill, or boil foods. Avoid frying. Meal planning   Work with your dietitian to develop an eating plan that is right for you. This may include: ? Tracking how many calories you take in. Use a food  diary, notebook, or mobile application to track what you eat at each meal. ? Using the glycemic index (GI) to plan your meals. The index tells you how quickly a food will raise your blood glucose. Choose low-GI foods. These foods take a longer time to raise blood glucose.  Consider following a Mediterranean diet. This diet includes: ? Several servings each day of fresh fruits and vegetables. ? Eating fish at least twice a week. ? Several servings each day of whole grains, beans, nuts, and seeds. ? Using olive oil instead of other fats. ? Moderate alcohol consumption. ? Eating small amounts of red meat and whole-fat dairy.  If you have high blood pressure, you may need to limit your sodium intake or follow a diet such as the DASH eating plan. DASH is an eating plan that aims to lower high blood pressure. What foods are recommended? The items listed below may not be a complete list. Talk with your dietitian about what dietary choices are best for you. Grains Whole grains, such as whole-wheat or whole-grain breads, crackers, cereals, and pasta. Unsweetened oatmeal. Bulgur. Barley. Quinoa. Brown rice. Corn or whole-wheat flour tortillas or taco shells. Vegetables Lettuce. Spinach. Peas. Beets. Cauliflower. Cabbage. Broccoli. Carrots. Tomatoes. Squash. Eggplant. Herbs. Peppers. Onions. Cucumbers. Brussels sprouts. Fruits Berries. Bananas. Apples. Oranges. Grapes. Papaya. Mango. Pomegranate. Kiwi. Grapefruit. Cherries. Meats and other protein foods Seafood. Poultry without skin. Lean cuts of pork and beef. Tofu. Eggs. Nuts. Beans. Dairy Low-fat or fat-free dairy products, such as yogurt, cottage cheese, and cheese. Beverages Water.  Tea. Coffee. Sugar-free or diet soda. Seltzer water. Lowfat or no-fat milk. Milk alternatives, such as soy or almond milk. Fats and oils Olive oil. Canola oil. Sunflower oil. Grapeseed oil. Avocado. Walnuts. Sweets and desserts Sugar-free or low-fat pudding.  Sugar-free or low-fat ice cream and other frozen treats. Seasoning and other foods Herbs. Sodium-free spices. Mustard. Relish. Low-fat, low-sugar ketchup. Low-fat, low-sugar barbecue sauce. Low-fat or fat-free mayonnaise. What foods are not recommended? The items listed below may not be a complete list. Talk with your dietitian about what dietary choices are best for you. Grains Refined white flour and flour products, such as bread, pasta, snack foods, and cereals. Vegetables Canned vegetables. Frozen vegetables with butter or cream sauce. Fruits Fruits canned with syrup. Meats and other protein foods Fatty cuts of meat. Poultry with skin. Breaded or fried meat. Processed meats. Dairy Full-fat yogurt, cheese, or milk. Beverages Sweetened drinks, such as sweet iced tea and soda. Fats and oils Butter. Lard. Ghee. Sweets and desserts Baked goods, such as cake, cupcakes, pastries, cookies, and cheesecake. Seasoning and other foods Spice mixes with added salt. Ketchup. Barbecue sauce. Mayonnaise. Summary  To prevent diabetes from developing, you may need to make diet and other lifestyle changes to help control blood sugar, improve cholesterol levels, and manage your blood pressure.  Set weight loss goals with the help of your health care team. It is recommended that most people with prediabetes lose 7 percent of their current body weight.  Consider following a Mediterranean diet that includes plenty of fresh fruits and vegetables, whole grains, beans, nuts, seeds, fish, lean meat, low-fat dairy, and healthy oils. This information is not intended to replace advice given to you by your health care provider. Make sure you discuss any questions you have with your health care provider. Document Revised: 09/17/2018 Document Reviewed: 07/30/2016 Elsevier Patient Education  2020 [employer].

## 2019-06-23 NOTE — Assessment & Plan Note (Signed)
Update echo.

## 2019-06-23 NOTE — Assessment & Plan Note (Signed)
Overall stable period. A1c peaked at 6.3%. He desires to treat this aggressively. Will increase metformin to 500mg  BID. cbg check today. I have asked him to check with insurance on preferred glucometer brand and let me know to send in for him.  Declines DSME at this time.

## 2019-06-23 NOTE — Assessment & Plan Note (Addendum)
Seeing ortho. Surgery date not yet scheduled. Discussed may need preop clearance prior to surgery.

## 2019-06-23 NOTE — Progress Notes (Signed)
This visit was conducted in person.  BP 140/84 (BP Location: Left Arm, Patient Position: Sitting, Cuff Size: Large)   Pulse 91   Temp 98.3 F (36.8 C) (Temporal)   Ht 5' 7.5" (1.715 m)   Wt 283 lb 7 oz (128.6 kg)   SpO2 94%   BMI 43.74 kg/m    CC: 3-4 mo f/u visit, discuss sugars Subjective:    Patient ID: Gregory Fisher, male    DOB: [birth year], 69 y.o.   MRN: [id number]  HPI: Gregory Fisher is a 69 y.o. male presenting on 06/23/2019 for Weight Gain (Wants to discuss wt gain and starting med. )   Currently living in [place] with best friend roommate who breeds lab retrievers.   Tested negative for Covid19 05/2019.   R knee osteoarthritis - has been told needs R knee replacement. Has completed knee gel shots Theda Sers).   Obesity - insurance did not cover nutritionist so he cancelled appointment. Interested in appetite suppressant. Trouble limiting diet - requests diabetic diet information.   Prediabetes/DM - does not regularly check sugars - has no glucose meter. Compliant with antihyperglycemic regimen which includes: metformin 500mg  daily. Denies low sugars or hypoglycemic symptoms. Denies paresthesias. Last diabetic eye exam DUE. Pneumovax: 06/2016. Prevnar: 04/2016. Glucometer brand: does not have at home. DSME: declines.  Lab Results  Component Value Date   HGBA1C 6.3 05/12/2019   Diabetic Foot Exam - Simple   No data filed     Lab Results  Component Value Date   MICROALBUR 0.9 07/26/2014        Relevant past medical, surgical, family and social history reviewed and updated as indicated. Interim medical history since our last visit reviewed. Allergies and medications reviewed and updated. Outpatient Medications Prior to Visit  Medication Sig Dispense Refill  . aspirin EC 81 MG tablet Take 81 mg by mouth daily.     Marland Kitchen atorvastatin (LIPITOR) 80 MG tablet TAKE 1 TABLET BY MOUTH  DAILY 90 tablet 0  . carvedilol (COREG) 12.5 MG tablet TAKE 1 TABLET BY MOUTH  TWICE  DAILY WITH A MEAL 180 tablet 0  . Cholecalciferol (VITAMIN D3) 25 MCG (1000 UT) CAPS Take 2 capsules (2,000 Units total) by mouth daily. 30 capsule   . cloNIDine (CATAPRES) 0.1 MG tablet TAKE 1 TABLET BY MOUTH  TWICE DAILY 180 tablet 0  . LORazepam (ATIVAN) 1 MG tablet TAKE 1 TABLET BY MOUTH  DAILY AS NEEDED FOR ANXIETY 40 tablet 0  . magnesium hydroxide (MILK OF MAGNESIA) 400 MG/5ML suspension Take 5 mL by mouth daily as needed for constipation.    . Naproxen Sodium (ALEVE PO) Take 1 tablet by mouth daily as needed.    . tamsulosin (FLOMAX) 0.4 MG CAPS capsule TAKE 2 CAPSULES BY MOUTH AT BEDTIME 180 capsule 2  . traMADol (ULTRAM) 50 MG tablet TAKE 1 TO 2 TABLETS BY MOUTH EVERY 6 HOURS AS NEEDED 60 tablet 0  . valsartan-hydrochlorothiazide (DIOVAN-HCT) 320-12.5 MG tablet TAKE 1 TABLET BY MOUTH  DAILY 90 tablet 0  . vitamin C (ASCORBIC ACID) 500 MG tablet Take 500 mg by mouth daily.    . metFORMIN (GLUCOPHAGE) 500 MG tablet Take 1 tablet (500 mg total) by mouth daily with breakfast. 90 tablet 3   No facility-administered medications prior to visit.     Per HPI unless specifically indicated in ROS section below Review of Systems Objective:    BP 140/84 (BP Location: Left Arm, Patient Position: Sitting, Cuff Size: Large)  Pulse 91   Temp 98.3 F (36.8 C) (Temporal)   Ht 5' 7.5" (1.715 m)   Wt 283 lb 7 oz (128.6 kg)   SpO2 94%   BMI 43.74 kg/m   Wt Readings from Last 3 Encounters:  06/23/19 283 lb 7 oz (128.6 kg)  02/28/19 290 lb 7 oz (131.7 kg)  12/27/18 279 lb 11.2 oz (126.9 kg)    Physical Exam Vitals and nursing note reviewed.  Constitutional:      Appearance: Normal appearance. He is obese. He is not ill-appearing.  Eyes:     Extraocular Movements: Extraocular movements intact.     Pupils: Pupils are equal, round, and reactive to light.  Cardiovascular:     Rate and Rhythm: Normal rate and regular rhythm.     Pulses: Normal pulses.     Heart sounds: Normal heart sounds.  No murmur.  Pulmonary:     Effort: Pulmonary effort is normal. No respiratory distress.     Breath sounds: Normal breath sounds. No wheezing, rhonchi or rales.  Neurological:     Mental Status: He is alert.  Psychiatric:        Mood and Affect: Mood normal.        Behavior: Behavior normal.       Lab Results  Component Value Date   HGBA1C 6.3 05/12/2019    Results for orders placed or performed in visit on 06/23/19  POCT Glucose (Device for Home Use)  Result Value Ref Range   Glucose Fasting, POC     POC Glucose 114 (A) 70 - 99 mg/dl    Assessment & Plan:  This visit occurred during the SARS-CoV-2 public health emergency.  Safety protocols were in place, including screening questions prior to the visit, additional usage of staff PPE, and extensive cleaning of exam room while observing appropriate contact time as indicated for disinfecting solutions.   Problem List Items Addressed This Visit    Primary osteoarthritis of right knee    Seeing ortho. Surgery date not yet scheduled. Discussed may need preop clearance prior to surgery.       Obesity, morbid, BMI 40.0-49.9 ([place])    Reviewed mild weight loss noted today - ?metformin related. He struggles with healthy diet choices. Provided with prediabetic diet plan. Activity limited by R knee pain.       Relevant Medications   metFORMIN (GLUCOPHAGE) 500 MG tablet   Controlled diabetes mellitus type 2 with complications ([place]) - Primary    Overall stable period. A1c peaked at 6.3%. He desires to treat this aggressively. Will increase metformin to 500mg  BID. cbg check today. I have asked him to check with insurance on preferred glucometer brand and let me know to send in for him.  Declines DSME at this time.       Relevant Medications   metFORMIN (GLUCOPHAGE) 500 MG tablet   Other Relevant Orders   POCT Glucose (Device for Home Use) (Completed)   Ascending aorta dilation ([facility])    Update echo.       Relevant Orders    ECHOCARDIOGRAM COMPLETE       Meds ordered this encounter  Medications  . metFORMIN (GLUCOPHAGE) 500 MG tablet    Sig: Take 1 tablet (500 mg total) by mouth 2 (two) times daily with a meal.    Dispense:  180 tablet    Refill:  3  . Cyanocobalamin (B-12) 1000 MCG SUBL    Sig: Place 1 tablet under the tongue daily.  Orders Placed This Encounter  Procedures  . POCT Glucose (Device for Home Use)  . ECHOCARDIOGRAM COMPLETE    Standing Status:   Future    Standing Expiration Date:   09/20/2020    Order Specific Question:   Where should this test be performed    Answer:   [facility]    Order Specific Question:   Please indicate who you request to read the echo results.    Answer:   [facility]    Order Specific Question:   Perflutren DEFINITY (image enhancing agent) should be administered unless hypersensitivity or allergy exist    Answer:   Administer Perflutren    Order Specific Question:   Is a special reader required? (athlete or structural heart)    Answer:   No    Order Specific Question:   Reason for exam-Echo    Answer:   Ascending aortic aneurysm  441.9 / [id number]    Patient instructions: Diabetes diet handout provided today. Increase metformin to 500mg  twice daily.  Check with insurance on preferred sugar meter brand and let me know.  Sugar check today Return in 2 weeks for physical.   Follow up plan: No follow-ups on file.  [name], MD

## 2019-06-23 NOTE — Assessment & Plan Note (Signed)
Reviewed mild weight loss noted today - ?metformin related. He struggles with healthy diet choices. Provided with prediabetic diet plan. Activity limited by R knee pain.

## 2019-07-05 ENCOUNTER — Ambulatory Visit (INDEPENDENT_AMBULATORY_CARE_PROVIDER_SITE_OTHER): Payer: Medicare Other

## 2019-07-05 ENCOUNTER — Other Ambulatory Visit: Payer: Self-pay

## 2019-07-05 DIAGNOSIS — Z0000 Encounter for general adult medical examination without abnormal findings: Secondary | ICD-10-CM

## 2019-07-05 NOTE — Progress Notes (Signed)
PCP notes:  Health Maintenance: Colon cancer screening- FOBT due   Abnormal Screenings: none   Patient concerns: none   Nurse concerns: none   Next PCP appt: 07/11/2019 @ 11:30 am
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
## 2019-07-05 NOTE — Patient Instructions (Signed)
Gregory Fisher , Thank you for taking time to come for your Medicare Wellness Visit. I appreciate your ongoing commitment to your health goals. Please review the following plan we discussed and let me know if I can assist you in the future.   Screening recommendations/referrals: Colonoscopy: FOBT due Recommended yearly ophthalmology/optometry visit for glaucoma screening and checkup Recommended yearly dental visit for hygiene and checkup  Vaccinations: Influenza vaccine: Up to date, completed 02/28/2019 Pneumococcal vaccine: Completed series Tdap vaccine: Up to date, completed 01/19/2013 Shingles vaccine: discussed    Advanced directives: Advance directive discussed with you today. Even though you declined this today please call our office should you change your mind and we can give you the proper paperwork for you to fill out.  Conditions/risks identified: diabetes, hypertension, hyperlipidemia  Next appointment: 07/11/2019 @ 11:30 am   Preventive Care 9 Years and Older, Male Preventive care refers to lifestyle choices and visits with your health care provider that can promote health and wellness. What does preventive care include?  A yearly physical exam. This is also called an annual well check.  Dental exams once or twice a year.  Routine eye exams. Ask your health care provider how often you should have your eyes checked.  Personal lifestyle choices, including:  Daily care of your teeth and gums.  Regular physical activity.  Eating a healthy diet.  Avoiding tobacco and drug use.  Limiting alcohol use.  Practicing safe sex.  Taking low doses of aspirin every day.  Taking vitamin and mineral supplements as recommended by your health care provider. What happens during an annual well check? The services and screenings done by your health care provider during your annual well check will depend on your age, overall health, lifestyle risk factors, and family history of  disease. Counseling  Your health care provider may ask you questions about your:  Alcohol use.  Tobacco use.  Drug use.  Emotional well-being.  Home and relationship well-being.  Sexual activity.  Eating habits.  History of falls.  Memory and ability to understand (cognition).  Work and work Statistician. Screening  You may have the following tests or measurements:  Height, weight, and BMI.  Blood pressure.  Lipid and cholesterol levels. These may be checked every 5 years, or more frequently if you are over 53 years old.  Skin check.  Lung cancer screening. You may have this screening every year starting at age 25 if you have a 30-pack-year history of smoking and currently smoke or have quit within the past 15 years.  Fecal occult blood test (FOBT) of the stool. You may have this test every year starting at age 66.  Flexible sigmoidoscopy or colonoscopy. You may have a sigmoidoscopy every 5 years or a colonoscopy every 10 years starting at age 25.  Prostate cancer screening. Recommendations will vary depending on your family history and other risks.  Hepatitis C blood test.  Hepatitis B blood test.  Sexually transmitted disease (STD) testing.  Diabetes screening. This is done by checking your blood sugar (glucose) after you have not eaten for a while (fasting). You may have this done every 1-3 years.  Abdominal aortic aneurysm (AAA) screening. You may need this if you are a current or former smoker.  Osteoporosis. You may be screened starting at age 50 if you are at high risk. Talk with your health care provider about your test results, treatment options, and if necessary, the need for more tests. Vaccines  Your health care provider may  recommend certain vaccines, such as:  Influenza vaccine. This is recommended every year.  Tetanus, diphtheria, and acellular pertussis (Tdap, Td) vaccine. You may need a Td booster every 10 years.  Zoster vaccine. You may  need this after age 36.  Pneumococcal 13-valent conjugate (PCV13) vaccine. One dose is recommended after age 63.  Pneumococcal polysaccharide (PPSV23) vaccine. One dose is recommended after age 2. Talk to your health care provider about which screenings and vaccines you need and how often you need them. This information is not intended to replace advice given to you by your health care provider. Make sure you discuss any questions you have with your health care provider. Document Released: 06/22/2015 Document Revised: 02/13/2016 Document Reviewed: 03/27/2015 Elsevier Interactive Patient Education  2017 [place] Prevention in the Home Falls can cause injuries. They can happen to people of all ages. There are many things you can do to make your home safe and to help prevent falls. What can I do on the outside of my home?  Regularly fix the edges of walkways and driveways and fix any cracks.  Remove anything that might make you trip as you walk through a door, such as a raised step or threshold.  Trim any bushes or trees on the path to your home.  Use bright outdoor lighting.  Clear any walking paths of anything that might make someone trip, such as rocks or tools.  Regularly check to see if handrails are loose or broken. Make sure that both sides of any steps have handrails.  Any raised decks and porches should have guardrails on the edges.  Have any leaves, snow, or ice cleared regularly.  Use sand or salt on walking paths during winter.  Clean up any spills in your garage right away. This includes oil or grease spills. What can I do in the bathroom?  Use night lights.  Install grab bars by the toilet and in the tub and shower. Do not use towel bars as grab bars.  Use non-skid mats or decals in the tub or shower.  If you need to sit down in the shower, use a plastic, non-slip stool.  Keep the floor dry. Clean up any water that spills on the floor as soon as it  happens.  Remove soap buildup in the tub or shower regularly.  Attach bath mats securely with double-sided non-slip rug tape.  Do not have throw rugs and other things on the floor that can make you trip. What can I do in the bedroom?  Use night lights.  Make sure that you have a light by your bed that is easy to reach.  Do not use any sheets or blankets that are too big for your bed. They should not hang down onto the floor.  Have a firm chair that has side arms. You can use this for support while you get dressed.  Do not have throw rugs and other things on the floor that can make you trip. What can I do in the kitchen?  Clean up any spills right away.  Avoid walking on wet floors.  Keep items that you use a lot in easy-to-reach places.  If you need to reach something above you, use a strong step stool that has a grab bar.  Keep electrical cords out of the way.  Do not use floor polish or wax that makes floors slippery. If you must use wax, use non-skid floor wax.  Do not have throw rugs and  other things on the floor that can make you trip. What can I do with my stairs?  Do not leave any items on the stairs.  Make sure that there are handrails on both sides of the stairs and use them. Fix handrails that are broken or loose. Make sure that handrails are as long as the stairways.  Check any carpeting to make sure that it is firmly attached to the stairs. Fix any carpet that is loose or worn.  Avoid having throw rugs at the top or bottom of the stairs. If you do have throw rugs, attach them to the floor with carpet tape.  Make sure that you have a light switch at the top of the stairs and the bottom of the stairs. If you do not have them, ask someone to add them for you. What else can I do to help prevent falls?  Wear shoes that:  Do not have high heels.  Have rubber bottoms.  Are comfortable and fit you well.  Are closed at the toe. Do not wear sandals.  If you  use a stepladder:  Make sure that it is fully opened. Do not climb a closed stepladder.  Make sure that both sides of the stepladder are locked into place.  Ask someone to hold it for you, if possible.  Clearly mark and make sure that you can see:  Any grab bars or handrails.  First and last steps.  Where the edge of each step is.  Use tools that help you move around (mobility aids) if they are needed. These include:  Canes.  Walkers.  Scooters.  Crutches.  Turn on the lights when you go into a dark area. Replace any light bulbs as soon as they burn out.  Set up your furniture so you have a clear path. Avoid moving your furniture around.  If any of your floors are uneven, fix them.  If there are any pets around you, be aware of where they are.  Review your medicines with your doctor. Some medicines can make you feel dizzy. This can increase your chance of falling. Ask your doctor what other things that you can do to help prevent falls. This information is not intended to replace advice given to you by your health care provider. Make sure you discuss any questions you have with your health care provider. Document Released: 03/22/2009 Document Revised: 11/01/2015 Document Reviewed: 06/30/2014 Elsevier Interactive Patient Education  2017 [employer].

## 2019-07-07 ENCOUNTER — Ambulatory Visit (INDEPENDENT_AMBULATORY_CARE_PROVIDER_SITE_OTHER): Payer: Medicare Other

## 2019-07-07 ENCOUNTER — Other Ambulatory Visit: Payer: Self-pay

## 2019-07-07 DIAGNOSIS — I517 Cardiomegaly: Secondary | ICD-10-CM

## 2019-07-07 DIAGNOSIS — I77810 Thoracic aortic ectasia: Secondary | ICD-10-CM

## 2019-07-11 ENCOUNTER — Ambulatory Visit (INDEPENDENT_AMBULATORY_CARE_PROVIDER_SITE_OTHER): Payer: Medicare Other | Admitting: Family Medicine

## 2019-07-11 ENCOUNTER — Other Ambulatory Visit: Payer: Self-pay

## 2019-07-11 ENCOUNTER — Encounter: Payer: Medicare Other | Admitting: Family Medicine

## 2019-07-11 ENCOUNTER — Encounter: Payer: Self-pay | Admitting: Family Medicine

## 2019-07-11 ENCOUNTER — Ambulatory Visit: Payer: Medicare Other | Admitting: Family Medicine

## 2019-07-11 VITALS — BP 126/88 | HR 88 | Temp 97.5°F | Resp 16 | Ht 67.5 in | Wt 273.0 lb

## 2019-07-11 DIAGNOSIS — F39 Unspecified mood [affective] disorder: Secondary | ICD-10-CM

## 2019-07-11 DIAGNOSIS — Z0000 Encounter for general adult medical examination without abnormal findings: Secondary | ICD-10-CM | POA: Diagnosis not present

## 2019-07-11 DIAGNOSIS — I77810 Thoracic aortic ectasia: Secondary | ICD-10-CM

## 2019-07-11 DIAGNOSIS — E559 Vitamin D deficiency, unspecified: Secondary | ICD-10-CM

## 2019-07-11 DIAGNOSIS — I10 Essential (primary) hypertension: Secondary | ICD-10-CM | POA: Diagnosis not present

## 2019-07-11 DIAGNOSIS — Z7189 Other specified counseling: Secondary | ICD-10-CM

## 2019-07-11 DIAGNOSIS — M8580 Other specified disorders of bone density and structure, unspecified site: Secondary | ICD-10-CM

## 2019-07-11 DIAGNOSIS — K429 Umbilical hernia without obstruction or gangrene: Secondary | ICD-10-CM

## 2019-07-11 DIAGNOSIS — Z1211 Encounter for screening for malignant neoplasm of colon: Secondary | ICD-10-CM | POA: Diagnosis not present

## 2019-07-11 DIAGNOSIS — E118 Type 2 diabetes mellitus with unspecified complications: Secondary | ICD-10-CM

## 2019-07-11 DIAGNOSIS — E1169 Type 2 diabetes mellitus with other specified complication: Secondary | ICD-10-CM

## 2019-07-11 DIAGNOSIS — E785 Hyperlipidemia, unspecified: Secondary | ICD-10-CM

## 2019-07-11 DIAGNOSIS — K760 Fatty (change of) liver, not elsewhere classified: Secondary | ICD-10-CM

## 2019-07-11 DIAGNOSIS — I6523 Occlusion and stenosis of bilateral carotid arteries: Secondary | ICD-10-CM

## 2019-07-11 DIAGNOSIS — N400 Enlarged prostate without lower urinary tract symptoms: Secondary | ICD-10-CM

## 2019-07-11 NOTE — Assessment & Plan Note (Signed)
Echo 06/2019 without measurement, just normal aortic root. Will discuss CTA at f/u, continue tight BP control.

## 2019-07-11 NOTE — Assessment & Plan Note (Signed)
Continue vitamin D.  Consider updated DEXA scan. Last done 2011.

## 2019-07-11 NOTE — Assessment & Plan Note (Addendum)
Congratulated on weight loss to date which he attributes to smaller portion sizes and increased physical activity. He is motivated to continue this effort.
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
## 2019-07-11 NOTE — Patient Instructions (Addendum)
Pass by lab for stool kit.  Return to urology if ongoing urinary trouble.  If interested, check with pharmacy about new 2 shot shingles series (shingrix).  Work on setting up living will You are doing well today. Continue current medicines.  Return as needed or in 3-4 months for diabetes follow up visit.   Health Maintenance After Age 69 After age 73, you are at a higher risk for certain long-term diseases and infections as well as injuries from falls. Falls are a major cause of broken bones and head injuries in people who are older than age 51. Getting regular preventive care can help to keep you healthy and well. Preventive care includes getting regular testing and making lifestyle changes as recommended by your health care provider. Talk with your health care provider about:  Which screenings and tests you should have. A screening is a test that checks for a disease when you have no symptoms.  A diet and exercise plan that is right for you. What should I know about screenings and tests to prevent falls? Screening and testing are the best ways to find a health problem early. Early diagnosis and treatment give you the best chance of managing medical conditions that are common after age 4. Certain conditions and lifestyle choices may make you more likely to have a fall. Your health care provider may recommend:  Regular vision checks. Poor vision and conditions such as cataracts can make you more likely to have a fall. If you wear glasses, make sure to get your prescription updated if your vision changes.  Medicine review. Work with your health care provider to regularly review all of the medicines you are taking, including over-the-counter medicines. Ask your health care provider about any side effects that may make you more likely to have a fall. Tell your health care provider if any medicines that you take make you feel dizzy or sleepy.  Osteoporosis screening. Osteoporosis is a condition that  causes the bones to get weaker. This can make the bones weak and cause them to break more easily.  Blood pressure screening. Blood pressure changes and medicines to control blood pressure can make you feel dizzy.  Strength and balance checks. Your health care provider may recommend certain tests to check your strength and balance while standing, walking, or changing positions.  Foot health exam. Foot pain and numbness, as well as not wearing proper footwear, can make you more likely to have a fall.  Depression screening. You may be more likely to have a fall if you have a fear of falling, feel emotionally low, or feel unable to do activities that you used to do.  Alcohol use screening. Using too much alcohol can affect your balance and may make you more likely to have a fall. What actions can I take to lower my risk of falls? General instructions  Talk with your health care provider about your risks for falling. Tell your health care provider if: ? You fall. Be sure to tell your health care provider about all falls, even ones that seem minor. ? You feel dizzy, sleepy, or off-balance.  Take over-the-counter and prescription medicines only as told by your health care provider. These include any supplements.  Eat a healthy diet and maintain a healthy weight. A healthy diet includes low-fat dairy products, low-fat (lean) meats, and fiber from whole grains, beans, and lots of fruits and vegetables. Home safety  Remove any tripping hazards, such as rugs, cords, and clutter.  Install  safety equipment such as grab bars in bathrooms and safety rails on stairs.  Keep rooms and walkways well-lit. Activity   Follow a regular exercise program to stay fit. This will help you maintain your balance. Ask your health care provider what types of exercise are appropriate for you.  If you need a cane or walker, use it as recommended by your health care provider.  Wear supportive shoes that have nonskid  soles. Lifestyle  Do not drink alcohol if your health care provider tells you not to drink.  If you drink alcohol, limit how much you have: ? 0-1 drink a day for women. ? 0-2 drinks a day for men.  Be aware of how much alcohol is in your drink. In the [place] one drink equals one typical bottle of beer (12 oz), one-half glass of wine (5 oz), or one shot of hard liquor (1 oz).  Do not use any products that contain nicotine or tobacco, such as cigarettes and e-cigarettes. If you need help quitting, ask your health care provider. Summary  Having a healthy lifestyle and getting preventive care can help to protect your health and wellness after age 66.  Screening and testing are the best way to find a health problem early and help you avoid having a fall. Early diagnosis and treatment give you the best chance for managing medical conditions that are more common for people who are older than age 16.  Falls are a major cause of broken bones and head injuries in people who are older than age 9. Take precautions to prevent a fall at home.  Work with your health care provider to learn what changes you can make to improve your health and wellness and to prevent falls. This information is not intended to replace advice given to you by your health care provider. Make sure you discuss any questions you have with your health care provider. Document Revised: 09/16/2018 Document Reviewed: 04/08/2017 Elsevier Patient Education  2020 [employer].

## 2019-07-11 NOTE — Assessment & Plan Note (Signed)
1-39% stenosis on [place] 2014. Consider updated.

## 2019-07-11 NOTE — Assessment & Plan Note (Signed)
PSA reassuringly normal. Encouraged uro f/u - on flomax 0.8mg  nightly.

## 2019-07-11 NOTE — Assessment & Plan Note (Signed)
Notes increasing anxiety with pandemic and current events. Continues lorazepam sparingly.

## 2019-07-11 NOTE — Assessment & Plan Note (Signed)
Preventative protocols reviewed and updated unless pt declined. Discussed healthy diet and lifestyle.  

## 2019-07-11 NOTE — Assessment & Plan Note (Signed)
Chronic, stable. Continue current regimen. 

## 2019-07-11 NOTE — Assessment & Plan Note (Addendum)
Reviewed [place] from 2019 - with fatty liver changes. LFTs normal, plt normal. Consider INR next labwork.

## 2019-07-11 NOTE — Assessment & Plan Note (Signed)
Advanced directive: does not have set up.Would want Jeneen Rinks oldest brother to be HCPOA.Willwork onadvanced directive packet.

## 2019-07-11 NOTE — Assessment & Plan Note (Signed)
Large, stable. Asxs. Encouraged continued weight loss.

## 2019-07-11 NOTE — Assessment & Plan Note (Signed)
Good control on replacement.

## 2019-07-11 NOTE — Assessment & Plan Note (Signed)
Chronic, stable. Continue metformin 500mg  bid.

## 2019-07-11 NOTE — Assessment & Plan Note (Signed)
Chronic, stable. Continue high potency statin. The ASCVD Risk score Mikey Bussing DC Jr., et al., 2013) failed to calculate for the following reasons:   The valid total cholesterol range is 130 to 320 mg/dL

## 2019-07-18 ENCOUNTER — Telehealth: Payer: Self-pay

## 2019-07-18 NOTE — Telephone Encounter (Signed)
LVM for pt to call for echo results and PCP msg.  Msg below; [name], MD  [place], [place] notify heart ultrasound returned showing normal heart pumping function, overall ok report. They did not see enlargement of ascending aorta. Would suggest repeat imaging in 1 year to monitor this closely, continue working towards weight loss and good BP control.

## 2019-08-08 DIAGNOSIS — M1711 Unilateral primary osteoarthritis, right knee: Secondary | ICD-10-CM | POA: Diagnosis not present

## 2019-08-18 ENCOUNTER — Other Ambulatory Visit: Payer: Self-pay | Admitting: Family Medicine

## 2019-10-12 ENCOUNTER — Ambulatory Visit (INDEPENDENT_AMBULATORY_CARE_PROVIDER_SITE_OTHER): Payer: Medicare Other | Admitting: Family Medicine

## 2019-10-12 ENCOUNTER — Other Ambulatory Visit: Payer: Self-pay

## 2019-10-12 ENCOUNTER — Encounter: Payer: Self-pay | Admitting: Family Medicine

## 2019-10-12 VITALS — BP 140/88 | HR 73 | Temp 98.0°F | Ht 67.5 in | Wt 283.4 lb

## 2019-10-12 DIAGNOSIS — M1711 Unilateral primary osteoarthritis, right knee: Secondary | ICD-10-CM | POA: Diagnosis not present

## 2019-10-12 DIAGNOSIS — E118 Type 2 diabetes mellitus with unspecified complications: Secondary | ICD-10-CM

## 2019-10-12 DIAGNOSIS — I50812 Chronic right heart failure: Secondary | ICD-10-CM | POA: Insufficient documentation

## 2019-10-12 LAB — POCT GLYCOSYLATED HEMOGLOBIN (HGB A1C): Hemoglobin A1C: 6.1 % — AB (ref 4.0–5.6)

## 2019-10-12 MED ORDER — OZEMPIC (0.25 OR 0.5 MG/DOSE) 2 MG/1.5ML ~~LOC~~ SOPN: 0.2500 mg | PEN_INJECTOR | SUBCUTANEOUS | 3 refills | Status: DC

## 2019-10-12 NOTE — Patient Instructions (Addendum)
Sugars are well controlled on metformin. Price out ozempic or trulicity - once a week shot. If you fill and start taking, decrease metformin to once daily. You can either return here for nurse visit for teaching or see pharmacist for teaching.  Work on diabetic diet (low sugars, low carbs) for goal weight loss.  Check with insurance on coverage for diabetes education classes. Let me know if interested.  Call and make eye doctor appointment.  Return in 3 months for follow up visit.

## 2019-10-12 NOTE — Assessment & Plan Note (Signed)
Chronic, severe, followed by ortho s/p gel injections. Told needs surgery but wants to work on weight loss first.

## 2019-10-12 NOTE — Progress Notes (Signed)
This visit was conducted in person.  BP 140/88 (BP Location: Left Arm, Patient Position: Sitting, Cuff Size: Large)   Pulse 73   Temp 98 F (36.7 C) (Temporal)   Ht 5' 7.5" (1.715 m)   Wt 283 lb 7 oz (128.6 kg)   SpO2 94%   BMI 43.74 kg/m    CC: 3 mo f/u visit  Subjective:    Patient ID: Gregory Fisher, male    DOB: 02/06/1951, 69 y.o.   MRN: [id number]  HPI: Gregory Fisher is a 69 y.o. male presenting on 10/12/2019 for Follow-up (Here for 3-4 mo f/u.)   Oldest brother passed away 09/02/2019 due to CHF. Was not doing well.   Completed covid vaccine last month.  Declines sleep study.   Weight gain noted. Trouble walking due to R knee pain. Seeing Dr Theda Sers at [facility]. Has been told needs knee replacement but needs to lose weight first. Has had steroid shots and gel shots into the R knee. activity limiting pain. Will update EKG. Patient wants to lose weight prior to surgery. Taking aleve for pain.   DM - does not regularly check sugars. Compliant with antihyperglycemic regimen which includes: metformin 500mg  bid. Denies low sugars or hypoglycemic symptoms. Denies paresthesias. Last diabetic eye exam DUE. Pneumovax: 2018. Prevnar: 2017. Glucometer brand: does not have one. DSME: will check with insurance on cost. No known fmhx thyroid cancer.  Lab Results  Component Value Date   HGBA1C 6.1 (A) 10/12/2019   Diabetic Foot Exam - Simple   Simple Foot Form Diabetic Foot exam was performed with the following findings: Yes 10/12/2019 10:25 AM  Visual Inspection See comments: Yes Sensation Testing Intact to touch and monofilament testing bilaterally: Yes Pulse Check Posterior Tibialis and Dorsalis pulse intact bilaterally: Yes Comments Maceration between 4th/5th digits on right    Lab Results  Component Value Date   MICROALBUR 0.9 07/26/2014     Echocardiogram 06/2019 - obese patient, technically difficult study. EF 50-55%, normal LV fxn without regional wall motion  abnormalities, G1DD, global RV low systolic function. LA mod dilated. Valves not well visualized. Normal aortic root size and structure.     Relevant past medical, surgical, family and social history reviewed and updated as indicated. Interim medical history since our last visit reviewed. Allergies and medications reviewed and updated. Outpatient Medications Prior to Visit  Medication Sig Dispense Refill  . aspirin EC 81 MG tablet Take 81 mg by mouth daily.     Marland Kitchen atorvastatin (LIPITOR) 80 MG tablet TAKE 1 TABLET BY MOUTH  DAILY 90 tablet 3  . carvedilol (COREG) 12.5 MG tablet TAKE 1 TABLET BY MOUTH  TWICE DAILY WITH A MEAL 180 tablet 3  . Cholecalciferol (VITAMIN D3) 25 MCG (1000 UT) CAPS Take 2 capsules (2,000 Units total) by mouth daily. 30 capsule   . cloNIDine (CATAPRES) 0.1 MG tablet TAKE 1 TABLET BY MOUTH  TWICE DAILY 180 tablet 3  . LORazepam (ATIVAN) 1 MG tablet TAKE 1 TABLET BY MOUTH  DAILY AS NEEDED FOR ANXIETY 40 tablet 0  . magnesium hydroxide (MILK OF MAGNESIA) 400 MG/5ML suspension Take 5 mL by mouth daily as needed for constipation.    . metFORMIN (GLUCOPHAGE) 500 MG tablet Take 1 tablet (500 mg total) by mouth 2 (two) times daily with a meal. 180 tablet 3  . Naproxen Sodium (ALEVE PO) Take 1 tablet by mouth daily as needed.    . tamsulosin (FLOMAX) 0.4 MG CAPS capsule TAKE 2 CAPSULES  BY MOUTH AT BEDTIME 180 capsule 2  . traMADol (ULTRAM) 50 MG tablet TAKE 1 TO 2 TABLETS BY MOUTH EVERY 6 HOURS AS NEEDED 60 tablet 0  . valsartan-hydrochlorothiazide (DIOVAN-HCT) 320-12.5 MG tablet TAKE 1 TABLET BY MOUTH  DAILY 90 tablet 3  . vitamin C (ASCORBIC ACID) 500 MG tablet Take 500 mg by mouth daily.    . Cyanocobalamin (B-12) 1000 MCG SUBL Place 1 tablet under the tongue daily. (Patient not taking: Reported on 10/12/2019)     No facility-administered medications prior to visit.     Per HPI unless specifically indicated in ROS section below Review of Systems Objective:  BP 140/88 (BP  Location: Left Arm, Patient Position: Sitting, Cuff Size: Large)   Pulse 73   Temp 98 F (36.7 C) (Temporal)   Ht 5' 7.5" (1.715 m)   Wt 283 lb 7 oz (128.6 kg)   SpO2 94%   BMI 43.74 kg/m   Wt Readings from Last 3 Encounters:  10/12/19 283 lb 7 oz (128.6 kg)  07/11/19 273 lb (123.8 kg)  06/23/19 283 lb 7 oz (128.6 kg)      Physical Exam Vitals and nursing note reviewed.  Constitutional:      General: He is not in acute distress.    Appearance: He is well-developed. He is obese. He is not ill-appearing.  Eyes:     General: No scleral icterus.    Extraocular Movements: Extraocular movements intact.     Conjunctiva/sclera: Conjunctivae normal.     Pupils: Pupils are equal, round, and reactive to light.  Cardiovascular:     Rate and Rhythm: Normal rate and regular rhythm.     Pulses: Normal pulses.     Heart sounds: Normal heart sounds. No murmur.  Pulmonary:     Effort: Pulmonary effort is normal. No respiratory distress.     Breath sounds: Normal breath sounds. No wheezing, rhonchi or rales.  Musculoskeletal:     Right lower leg: Edema (tr) present.     Left lower leg: Edema (tr) present.     Comments: See HPI for foot exam if done  Skin:    General: Skin is warm and dry.     Findings: No rash.  Neurological:     Mental Status: He is alert.  Psychiatric:        Mood and Affect: Mood normal.        Behavior: Behavior normal.       Results for orders placed or performed in visit on 10/12/19  POCT glycosylated hemoglobin (Hb A1C)  Result Value Ref Range   Hemoglobin A1C 6.1 (A) 4.0 - 5.6 %   HbA1c POC (<> result, manual entry)     HbA1c, POC (prediabetic range)     HbA1c, POC (controlled diabetic range)     Assessment & Plan:  This visit occurred during the SARS-CoV-2 public health emergency.  Safety protocols were in place, including screening questions prior to the visit, additional usage of staff PPE, and extensive cleaning of exam room while observing  appropriate contact time as indicated for disinfecting solutions.   Problem List Items Addressed This Visit    Primary osteoarthritis of right knee    Chronic, severe, followed by ortho s/p gel injections. Told needs surgery but wants to work on weight loss first.       Obesity, morbid, BMI 40.0-49.9 (HCC)    Chronic, 10 lb weight gain noted. Encouraged renewed efforts to follow diabetic diet. He asks about appetite suppressant.  Will price out once weekly GLP1 RA.       Relevant Medications   Semaglutide,0.25 or 0.5MG /DOS, (OZEMPIC, 0.25 OR 0.5 MG/DOSE,) 2 MG/1.5ML SOPN   Controlled diabetes mellitus type 2 with complications (HCC) - Primary    Chronic, stable on metformin - interested in weekly shot to help with weight loss. Will price out ozempic vs trulicity. Reviewed mechanism of action. No known fmhx thyroid disease. If started, will decrease metformin to 500mg  once daily. RTC 3 mo f/u visit.  Encouraged he schedule eye exam and inquire about price for MSE.      Relevant Medications   Semaglutide,0.25 or 0.5MG /DOS, (OZEMPIC, 0.25 OR 0.5 MG/DOSE,) 2 MG/1.5ML SOPN   Other Relevant Orders   POCT glycosylated hemoglobin (Hb A1C) (Completed)   Chronic right heart failure (HCC)    Signs of this on recent echo. Discussed obesity and possible OSA contribution. He declines sleep study.           Meds ordered this encounter  Medications  . Semaglutide,0.25 or 0.5MG /DOS, (OZEMPIC, 0.25 OR 0.5 MG/DOSE,) 2 MG/1.5ML SOPN    Sig: Inject 0.25 mg into the skin once a week.    Dispense:  1 pen    Refill:  3    Price out vs trulicity   Orders Placed This Encounter  Procedures  . POCT glycosylated hemoglobin (Hb A1C)    Patient Instructions  Sugars are well controlled on metformin. Price out ozempic or trulicity - once a week shot. If you fill and start taking, decrease metformin to once daily. You can either return here for nurse visit for teaching or see pharmacist for teaching.  Work  on diabetic diet (low sugars, low carbs) for goal weight loss.  Check with insurance on coverage for diabetes education classes. Let me know if interested.  Call and make eye doctor appointment.  Return in 3 months for follow up visit.    Follow up plan: Return in about 3 months (around 01/12/2020) for follow up visit.  [name], MD

## 2019-10-12 NOTE — Assessment & Plan Note (Signed)
Chronic, 10 lb weight gain noted. Encouraged renewed efforts to follow diabetic diet. He asks about appetite suppressant. Will price out once weekly GLP1 RA.

## 2019-10-12 NOTE — Assessment & Plan Note (Signed)
Signs of this on recent echo. Discussed obesity and possible OSA contribution. He declines sleep study.

## 2019-10-12 NOTE — Assessment & Plan Note (Addendum)
Chronic, stable on metformin - interested in weekly shot to help with weight loss. Will price out ozempic vs trulicity. Reviewed mechanism of action. No known fmhx thyroid disease. If started, will decrease metformin to 500mg  once daily. RTC 3 mo f/u visit.  Encouraged he schedule eye exam and inquire about price for MSE.

## 2020-01-11 ENCOUNTER — Encounter: Payer: Self-pay | Admitting: Family Medicine

## 2020-01-11 ENCOUNTER — Other Ambulatory Visit: Payer: Self-pay

## 2020-01-11 ENCOUNTER — Ambulatory Visit (INDEPENDENT_AMBULATORY_CARE_PROVIDER_SITE_OTHER): Payer: Medicare Other | Admitting: Family Medicine

## 2020-01-11 VITALS — BP 132/80 | HR 83 | Temp 97.5°F | Ht 67.5 in | Wt 288.2 lb

## 2020-01-11 DIAGNOSIS — I10 Essential (primary) hypertension: Secondary | ICD-10-CM

## 2020-01-11 DIAGNOSIS — E118 Type 2 diabetes mellitus with unspecified complications: Secondary | ICD-10-CM | POA: Diagnosis not present

## 2020-01-11 NOTE — Patient Instructions (Addendum)
Return in 3 months for Diabetes follow up visit.  Bring ozempic pen to next visit - we will do shot teaching at that time.  Work on low sugar low carb diet.  Follow up with ortho for elbow and knees.

## 2020-01-11 NOTE — Assessment & Plan Note (Signed)
Chronic, stable on current regimen.  

## 2020-01-11 NOTE — Assessment & Plan Note (Addendum)
Chronic, stable on metformin. Too early for A1c. Had trouble with ozempic self-injection - I asked him to bring pen to next appointment to review.

## 2020-01-11 NOTE — Assessment & Plan Note (Signed)
Ongoing weight gain noted.  Activity limited by DJD.  rec against appetite suppressant.  rec retrial ozempic - to get teaching again through pharmacy or bring pen to next visit for teaching here.

## 2020-01-11 NOTE — Progress Notes (Signed)
This visit was conducted in person.  BP 132/80 (BP Location: Left Arm, Patient Position: Sitting, Cuff Size: Large)   Pulse 83   Temp (!) 97.5 F (36.4 C) (Temporal)   Ht 5' 7.5" (1.715 m)   Wt 288 lb 3 oz (130.7 kg)   SpO2 92%   BMI 44.47 kg/m    CC: 3 mo f/u visit  Subjective:    Patient ID: Gregory Fisher, male    DOB: 1950-07-25, 69 y.o.   MRN: [id number]  HPI: Gregory Fisher is a 69 y.o. male presenting on 01/11/2020 for Diabetes (Here for 3 mo f/u.)   "Patrick Jupiter" Oldest brother passed away 08/31/19 from CHF.  Now living with friend in [place] - for last 6 months.   Has had surgery through Dr Trenton Gammon and Fredna Dow for R elbow pain. H/o bilateral CTS. Has also had surgeries by Dr Maxie Better and Theda Sers.  Obesity - ongoing weight gain noted - has trouble with activity/exercise due to knee pain.   DM - does not regularly check sugars. Compliant with antihyperglycemic regimen which includes: metformin 500mg  bid. He stopped ozempic 0.25mg  - trouble dialing pens and doing actual needles. Denies low sugars or hypoglycemic symptoms. Denies paresthesias. Last diabetic eye exam DUE. Pneumovax: 2018. Prevnar: 2017. Glucometer brand: doesn't have at home. DSME: difficulty with cost. Not yet due for A1c.  Lab Results  Component Value Date   HGBA1C 6.1 (A) 10/12/2019   Diabetic Foot Exam - Simple   No data filed     Lab Results  Component Value Date   MICROALBUR 0.9 07/26/2014        Relevant past medical, surgical, family and social history reviewed and updated as indicated. Interim medical history since our last visit reviewed. Allergies and medications reviewed and updated. Outpatient Medications Prior to Visit  Medication Sig Dispense Refill  . aspirin EC 81 MG tablet Take 81 mg by mouth daily.     Marland Kitchen atorvastatin (LIPITOR) 80 MG tablet TAKE 1 TABLET BY MOUTH  DAILY 90 tablet 3  . carvedilol (COREG) 12.5 MG tablet TAKE 1 TABLET BY MOUTH  TWICE DAILY WITH A MEAL 180 tablet 3  .  Cholecalciferol (VITAMIN D3) 25 MCG (1000 UT) CAPS Take 2 capsules (2,000 Units total) by mouth daily. 30 capsule   . cloNIDine (CATAPRES) 0.1 MG tablet TAKE 1 TABLET BY MOUTH  TWICE DAILY 180 tablet 3  . Cyanocobalamin (B-12) 1000 MCG SUBL Place 1 tablet under the tongue daily.    Marland Kitchen LORazepam (ATIVAN) 1 MG tablet TAKE 1 TABLET BY MOUTH  DAILY AS NEEDED FOR ANXIETY 40 tablet 0  . magnesium hydroxide (MILK OF MAGNESIA) 400 MG/5ML suspension Take 5 mL by mouth daily as needed for constipation.    . metFORMIN (GLUCOPHAGE) 500 MG tablet Take 1 tablet (500 mg total) by mouth 2 (two) times daily with a meal. 180 tablet 3  . Naproxen Sodium (ALEVE PO) Take 1 tablet by mouth daily as needed.    . Semaglutide,0.25 or 0.5MG /DOS, (OZEMPIC, 0.25 OR 0.5 MG/DOSE,) 2 MG/1.5ML SOPN Inject 0.1875 mLs (0.25 mg total) into the skin once a week. 1 pen 3  . tamsulosin (FLOMAX) 0.4 MG CAPS capsule TAKE 2 CAPSULES BY MOUTH AT BEDTIME 180 capsule 2  . traMADol (ULTRAM) 50 MG tablet TAKE 1 TO 2 TABLETS BY MOUTH EVERY 6 HOURS AS NEEDED 60 tablet 0  . valsartan-hydrochlorothiazide (DIOVAN-HCT) 320-12.5 MG tablet TAKE 1 TABLET BY MOUTH  DAILY 90 tablet 3  .  vitamin C (ASCORBIC ACID) 500 MG tablet Take 500 mg by mouth daily.    . Semaglutide,0.25 or 0.5MG /DOS, (OZEMPIC, 0.25 OR 0.5 MG/DOSE,) 2 MG/1.5ML SOPN Inject 0.25 mg into the skin once a week. 1 pen 3   No facility-administered medications prior to visit.     Per HPI unless specifically indicated in ROS section below Review of Systems Objective:  BP 132/80 (BP Location: Left Arm, Patient Position: Sitting, Cuff Size: Large)   Pulse 83   Temp (!) 97.5 F (36.4 C) (Temporal)   Ht 5' 7.5" (1.715 m)   Wt 288 lb 3 oz (130.7 kg)   SpO2 92%   BMI 44.47 kg/m   Wt Readings from Last 3 Encounters:  01/11/20 288 lb 3 oz (130.7 kg)  10/12/19 283 lb 7 oz (128.6 kg)  07/11/19 273 lb (123.8 kg)      Physical Exam Vitals and nursing note reviewed.  Constitutional:       General: He is not in acute distress.    Appearance: Normal appearance. He is well-developed. He is obese. He is not ill-appearing.  HENT:     Head: Normocephalic and atraumatic.  Eyes:     General: No scleral icterus.    Extraocular Movements: Extraocular movements intact.     Conjunctiva/sclera: Conjunctivae normal.     Pupils: Pupils are equal, round, and reactive to light.  Cardiovascular:     Rate and Rhythm: Normal rate and regular rhythm.     Pulses: Normal pulses.     Heart sounds: Normal heart sounds. No murmur heard.   Pulmonary:     Effort: Pulmonary effort is normal. No respiratory distress.     Breath sounds: Normal breath sounds. No wheezing, rhonchi or rales.  Abdominal:     General: There is distension.  Musculoskeletal:     Right lower leg: Edema (nonpitting) present.     Left lower leg: Edema (nonpitting) present.     Comments: See HPI for foot exam if done  Skin:    General: Skin is warm and dry.     Findings: No rash.  Neurological:     Mental Status: He is alert.  Psychiatric:        Mood and Affect: Mood normal.        Behavior: Behavior normal.       Results for orders placed or performed in visit on 10/12/19  POCT glycosylated hemoglobin (Hb A1C)  Result Value Ref Range   Hemoglobin A1C 6.1 (A) 4.0 - 5.6 %   HbA1c POC (<> result, manual entry)     HbA1c, POC (prediabetic range)     HbA1c, POC (controlled diabetic range)     Assessment & Plan:  This visit occurred during the SARS-CoV-2 public health emergency.  Safety protocols were in place, including screening questions prior to the visit, additional usage of staff PPE, and extensive cleaning of exam room while observing appropriate contact time as indicated for disinfecting solutions.   Problem List Items Addressed This Visit    Obesity, morbid, BMI 40.0-49.9 ([place])    Ongoing weight gain noted.  Activity limited by DJD.  rec against appetite suppressant.  rec retrial ozempic - to get  teaching again through pharmacy or bring pen to next visit for teaching here.       Relevant Medications   Semaglutide,0.25 or 0.5MG /DOS, (OZEMPIC, 0.25 OR 0.5 MG/DOSE,) 2 MG/1.5ML SOPN   HTN (hypertension)    Chronic, stable on current regimen.  Controlled diabetes mellitus type 2 with complications (HCC) - Primary    Chronic, stable on metformin. Too early for A1c. Had trouble with ozempic self-injection - I asked him to bring pen to next appointment to review.       Relevant Medications   Semaglutide,0.25 or 0.5MG /DOS, (OZEMPIC, 0.25 OR 0.5 MG/DOSE,) 2 MG/1.5ML SOPN       No orders of the defined types were placed in this encounter.  No orders of the defined types were placed in this encounter.   Patient Instructions  Return in 3 months for Diabetes follow up visit.  Bring ozempic pen to next visit - we will do shot teaching at that time.  Work on low sugar low carb diet.  Follow up with ortho for elbow and knees.    Follow up plan: Return in about 3 months (around 04/12/2020) for follow up visit.  [name], MD

## 2020-03-21 ENCOUNTER — Other Ambulatory Visit: Payer: Self-pay | Admitting: Family Medicine

## 2020-04-18 ENCOUNTER — Ambulatory Visit: Payer: Medicare Other | Admitting: Family Medicine

## 2020-06-06 ENCOUNTER — Encounter: Payer: Self-pay | Admitting: Family Medicine

## 2020-06-06 ENCOUNTER — Ambulatory Visit (INDEPENDENT_AMBULATORY_CARE_PROVIDER_SITE_OTHER): Payer: Medicare Other | Admitting: Family Medicine

## 2020-06-06 ENCOUNTER — Other Ambulatory Visit: Payer: Self-pay

## 2020-06-06 VITALS — BP 140/86 | HR 75 | Temp 98.0°F | Ht 67.5 in | Wt 271.0 lb

## 2020-06-06 DIAGNOSIS — I10 Essential (primary) hypertension: Secondary | ICD-10-CM | POA: Diagnosis not present

## 2020-06-06 DIAGNOSIS — Z23 Encounter for immunization: Secondary | ICD-10-CM | POA: Diagnosis not present

## 2020-06-06 DIAGNOSIS — E118 Type 2 diabetes mellitus with unspecified complications: Secondary | ICD-10-CM

## 2020-06-06 LAB — POCT GLYCOSYLATED HEMOGLOBIN (HGB A1C): Hemoglobin A1C: 5.9 % — AB (ref 4.0–5.6)

## 2020-06-06 NOTE — Assessment & Plan Note (Addendum)
Chronic, great control on metformin. ozempic was unaffordable and he had trouble with self injection with pen. Will remain only on metformin. Consider b12 check next labs. He is not currently taking b12 replacement.

## 2020-06-06 NOTE — Assessment & Plan Note (Signed)
Chronic, stable on current regimen - continue. 

## 2020-06-06 NOTE — Assessment & Plan Note (Signed)
Congratulated on weight loss noted. He attributes to healthier diet and smaller portions. Feels this is sustainable. Did not tolerate ozempic.

## 2020-06-06 NOTE — Patient Instructions (Addendum)
Flu shot today.  Check with insurance for preferred sugar meter brand.  You are doing well today Return mid March for physical.  Try antifungal cream (lotrimin) between 4th/5th toes on right for some skin breakdown noted today. Make sure to pat dry after shower between toes as well.

## 2020-06-06 NOTE — Progress Notes (Signed)
Patient ID: Gregory Fisher, male    DOB: July 13, 1950, 69 y.o.   MRN: [id number]  This visit was conducted in person.  BP 140/86 (BP Location: Left Arm, Patient Position: Sitting, Cuff Size: Normal)   Pulse 75   Temp 98 F (36.7 C) (Temporal)   Ht 5' 7.5" (1.715 m)   Wt 271 lb (122.9 kg)   SpO2 95%   BMI 41.82 kg/m    CC: 4 mo DM f/u visit  Subjective:   HPI: Gregory Fisher is a 69 y.o. male presenting on 06/06/2020 for Diabetes (Here for 3 mo f/u.)   16 lb weight loss noted since last visit. He stopped ozempic. He is heating healthier - less junk food, more vegetables.   Not taking b12 Lab Results  Component Value Date   [id number] 938 05/09/2014    DM - does not regularly check sugars. Compliant with antihyperglycemic regimen which includes: metformin 500mg  bid. He stopped ozempic several months ago due to cost and difficulty with injections. Denies low sugars or hypoglycemic symptoms. Denies paresthesias. Last diabetic eye exam DUE. Pneumovax: 2018. Prevnar: 2017. Glucometer brand: does not have. DSME: has not completed - worried about cost.  Lab Results  Component Value Date   HGBA1C 5.9 (A) 06/06/2020   Diabetic Foot Exam - Simple   Simple Foot Form Diabetic Foot exam was performed with the following findings: Yes 06/06/2020  9:35 AM  Visual Inspection See comments: Yes Sensation Testing Intact to touch and monofilament testing bilaterally: Yes Pulse Check Posterior Tibialis and Dorsalis pulse intact bilaterally: Yes Comments Maceration between 4th/5th toes on right    Lab Results  Component Value Date   MICROALBUR 0.9 07/26/2014        Relevant past medical, surgical, family and social history reviewed and updated as indicated. Interim medical history since our last visit reviewed. Allergies and medications reviewed and updated. Outpatient Medications Prior to Visit  Medication Sig Dispense Refill  . aspirin EC 81 MG tablet Take 81 mg by mouth daily.      Marland Kitchen atorvastatin (LIPITOR) 80 MG tablet TAKE 1 TABLET BY MOUTH  DAILY 90 tablet 3  . carvedilol (COREG) 12.5 MG tablet TAKE 1 TABLET BY MOUTH  TWICE DAILY WITH A MEAL 180 tablet 3  . Cholecalciferol (VITAMIN D3) 25 MCG (1000 UT) CAPS Take 2 capsules (2,000 Units total) by mouth daily. 30 capsule   . cloNIDine (CATAPRES) 0.1 MG tablet TAKE 1 TABLET BY MOUTH  TWICE DAILY 180 tablet 3  . LORazepam (ATIVAN) 1 MG tablet TAKE 1 TABLET BY MOUTH  DAILY AS NEEDED FOR ANXIETY 40 tablet 0  . magnesium hydroxide (MILK OF MAGNESIA) 400 MG/5ML suspension Take 5 mL by mouth daily as needed for constipation.    . metFORMIN (GLUCOPHAGE) 500 MG tablet Take 1 tablet (500 mg total) by mouth 2 (two) times daily with a meal. 180 tablet 3  . Naproxen Sodium (ALEVE PO) Take 1 tablet by mouth daily as needed.    . tamsulosin (FLOMAX) 0.4 MG CAPS capsule TAKE 2 CAPSULES BY MOUTH AT BEDTIME 180 capsule 1  . traMADol (ULTRAM) 50 MG tablet TAKE 1 TO 2 TABLETS BY MOUTH EVERY 6 HOURS AS NEEDED 60 tablet 0  . valsartan-hydrochlorothiazide (DIOVAN-HCT) 320-12.5 MG tablet TAKE 1 TABLET BY MOUTH  DAILY 90 tablet 3  . vitamin C (ASCORBIC ACID) 500 MG tablet Take 500 mg by mouth daily.    . Cyanocobalamin (B-12) 1000 MCG SUBL Place 1 tablet  under the tongue daily.    . Semaglutide,0.25 or 0.5MG /DOS, (OZEMPIC, 0.25 OR 0.5 MG/DOSE,) 2 MG/1.5ML SOPN Inject 0.1875 mLs (0.25 mg total) into the skin once a week. 1 pen 3   No facility-administered medications prior to visit.     Per HPI unless specifically indicated in ROS section below Review of Systems Objective:  BP 140/86 (BP Location: Left Arm, Patient Position: Sitting, Cuff Size: Normal)   Pulse 75   Temp 98 F (36.7 C) (Temporal)   Ht 5' 7.5" (1.715 m)   Wt 271 lb (122.9 kg)   SpO2 95%   BMI 41.82 kg/m   Wt Readings from Last 3 Encounters:  06/06/20 271 lb (122.9 kg)  01/11/20 288 lb 3 oz (130.7 kg)  10/12/19 283 lb 7 oz (128.6 kg)      Physical Exam Vitals and  nursing note reviewed.  Constitutional:      General: He is not in acute distress.    Appearance: Normal appearance. He is well-developed and well-nourished. He is not ill-appearing.     Comments: Somewhat disheveled  HENT:     Head: Normocephalic and atraumatic.     Mouth/Throat:     Mouth: Oropharynx is clear and moist.  Eyes:     General: No scleral icterus.    Extraocular Movements: Extraocular movements intact and EOM normal.     Conjunctiva/sclera: Conjunctivae normal.     Pupils: Pupils are equal, round, and reactive to light.  Cardiovascular:     Rate and Rhythm: Normal rate and regular rhythm.     Pulses: Normal pulses and intact distal pulses.     Heart sounds: Normal heart sounds. No murmur heard.   Pulmonary:     Effort: Pulmonary effort is normal. No respiratory distress.     Breath sounds: Normal breath sounds. No wheezing, rhonchi or rales.  Abdominal:     General: Abdomen is protuberant.  Musculoskeletal:        General: No edema.     Cervical back: Normal range of motion and neck supple.     Right lower leg: No edema.     Left lower leg: No edema.     Comments: See HPI for foot exam if done  Lymphadenopathy:     Cervical: No cervical adenopathy.  Skin:    General: Skin is warm and dry.     Findings: No rash.  Neurological:     Mental Status: He is alert.  Psychiatric:        Mood and Affect: Mood and affect and mood normal.        Behavior: Behavior normal.       Results for orders placed or performed in visit on 06/06/20  POCT glycosylated hemoglobin (Hb A1C)  Result Value Ref Range   Hemoglobin A1C 5.9 (A) 4.0 - 5.6 %   HbA1c POC (<> result, manual entry)     HbA1c, POC (prediabetic range)     HbA1c, POC (controlled diabetic range)     Assessment & Plan:  This visit occurred during the SARS-CoV-2 public health emergency.  Safety protocols were in place, including screening questions prior to the visit, additional usage of staff PPE, and  extensive cleaning of exam room while observing appropriate contact time as indicated for disinfecting solutions.   Problem List Items Addressed This Visit    Obesity, morbid, BMI 40.0-49.9 (HCC)    Congratulated on weight loss noted. He attributes to healthier diet and smaller portions. Feels this is sustainable.  Did not tolerate ozempic.       HTN (hypertension)    Chronic, stable on current regimen - continue.       Controlled diabetes mellitus type 2 with complications (HCC) - Primary    Chronic, great control on metformin. ozempic was unaffordable and he had trouble with self injection with pen. Will remain only on metformin. Consider b12 check next labs. He is not currently taking b12 replacement.       Relevant Orders   POCT glycosylated hemoglobin (Hb A1C) (Completed)    Other Visit Diagnoses    Need for influenza vaccination       Relevant Orders   Flu Vaccine QUAD High Dose(Fluad) (Completed)       No orders of the defined types were placed in this encounter.  Orders Placed This Encounter  Procedures  . Flu Vaccine QUAD High Dose(Fluad)  . POCT glycosylated hemoglobin (Hb A1C)    Patient Instructions  Flu shot today.  Check with insurance for preferred sugar meter brand.  You are doing well today Return mid March for physical.  Try antifungal cream (lotrimin) between 4th/5th toes on right for some skin breakdown noted today. Make sure to pat dry after shower between toes as well.   Follow up plan: Return in about 2 months (around 08/21/2020), or if symptoms worsen or fail to improve, for annual exam, prior fasting for blood work.  Eustaquio Boyden, MD

## 2020-07-02 ENCOUNTER — Other Ambulatory Visit: Payer: Self-pay | Admitting: Family Medicine

## 2020-07-04 ENCOUNTER — Other Ambulatory Visit: Payer: Self-pay

## 2020-07-04 NOTE — Telephone Encounter (Signed)
Name of Medication: Lorazepam 1 mg Name of Pharmacy: [facility] Last Fill or Written Date and Quantity: #40 on 01/21/2019 Last Office Visit and Type: 06/06/20 for FU DM Next Office Visit and Type:08/29/20 for CPX   Pt said he had not been taking the Lorazepam but pt has been under a lot of stress due to pandemic. Pt request refills attached to rx if Dr Darnell Level will approve. Pt request cb when refill sent to [facility].

## 2020-07-05 NOTE — Telephone Encounter (Signed)
Opened in error

## 2020-07-06 MED ORDER — LORAZEPAM 1 MG PO TABS: 1.0000 mg | ORAL_TABLET | Freq: Every day | ORAL | 0 refills | Status: DC | PRN

## 2020-07-06 NOTE — Telephone Encounter (Signed)
ERx to local pharmacy plz notify patient.

## 2020-07-06 NOTE — Telephone Encounter (Signed)
Attempted to contact pt to relay Dr. Synthia Innocent message.  No of phn #s in chart are working numbers.

## 2020-07-13 ENCOUNTER — Other Ambulatory Visit: Payer: Self-pay | Admitting: Family Medicine

## 2020-07-13 MED ORDER — LORAZEPAM 1 MG PO TABS: 1.0000 mg | ORAL_TABLET | Freq: Every day | ORAL | 0 refills | Status: DC | PRN

## 2020-07-13 NOTE — Addendum Note (Signed)
Addended by: Brenton Grills on: 11/16/7946 01:65 PM   Modules accepted: Orders

## 2020-07-13 NOTE — Telephone Encounter (Signed)
See TE, 07/13/20.

## 2020-07-13 NOTE — Telephone Encounter (Signed)
Spoke with pt relaying Dr. Synthia Innocent message.  Pt verbalizes understanding and now requests rx be sent to [facility], stating he has been living there for the past yr.  Updated pt's chart.

## 2020-07-13 NOTE — Telephone Encounter (Signed)
I recommend he get lorazepam locally.  I want controlled substances to be filled locally for many reasons including safety.  We can further discuss at his next office visit but again he should fill this medicine locally.

## 2020-07-13 NOTE — Telephone Encounter (Signed)
ERx 

## 2020-07-13 NOTE — Telephone Encounter (Signed)
Patient states that he is need of a refill on: Lorazepam  Pharmacy: [facility]   He is stating that they are holding up his other medications because they couldn't get ahold of [place].

## 2020-07-13 NOTE — Addendum Note (Signed)
Addended by: Brenton Grills on: 0/10/6977 48:01 PM   Modules accepted: Orders

## 2020-07-13 NOTE — Telephone Encounter (Signed)
Pt returning call.  I notified pt Dr. Darnell Level sent refill to local pharmacy (see Refill, 07/04/20).  Pt states it's cheaper, if not free, for him to get rx from [facility].

## 2020-07-22 ENCOUNTER — Other Ambulatory Visit: Payer: Self-pay | Admitting: Family Medicine

## 2020-07-26 ENCOUNTER — Other Ambulatory Visit: Payer: Self-pay | Admitting: *Deleted

## 2020-07-26 NOTE — Telephone Encounter (Signed)
Gregory Fisher called triage line asking for a refill on his Lorazepam.  He would like it sent to [facility] so it can be delivered to him.  I advised patient that Dr. Darnell Level refilled [facility]ep[facility] [place].  I recommended that he go ahead a pick up that prescription so he will have his medication now but I would send Dr. Darnell Level a message to go ahead and send next refill to [facility] since it will take a couple of weeks to come from [facility].  Patient is agreement with that plan.

## 2020-07-28 NOTE — Telephone Encounter (Signed)
We've previously discussed this - I recommend controlled substances to be filled locally for multiple reasons. Recommend he continue getting this locally.

## 2020-07-31 NOTE — Telephone Encounter (Signed)
Lvm asking pt to call back.  Need to relay Dr. G's message.  

## 2020-08-01 NOTE — Telephone Encounter (Signed)
Lvm asking pt to call back.  Need to relay Dr. G's message.  

## 2020-08-02 NOTE — Telephone Encounter (Signed)
Mailing a letter and closing encounter.

## 2020-08-14 ENCOUNTER — Ambulatory Visit: Payer: Medicare Other

## 2020-08-22 ENCOUNTER — Other Ambulatory Visit: Payer: Self-pay | Admitting: Family Medicine

## 2020-08-22 ENCOUNTER — Other Ambulatory Visit: Payer: Medicare Other

## 2020-08-22 DIAGNOSIS — E1169 Type 2 diabetes mellitus with other specified complication: Secondary | ICD-10-CM

## 2020-08-22 DIAGNOSIS — E559 Vitamin D deficiency, unspecified: Secondary | ICD-10-CM

## 2020-08-22 DIAGNOSIS — Z125 Encounter for screening for malignant neoplasm of prostate: Secondary | ICD-10-CM

## 2020-08-22 DIAGNOSIS — E118 Type 2 diabetes mellitus with unspecified complications: Secondary | ICD-10-CM

## 2020-08-29 ENCOUNTER — Encounter: Payer: Medicare Other | Admitting: Family Medicine

## 2020-09-03 ENCOUNTER — Telehealth: Payer: Self-pay | Admitting: Family Medicine

## 2020-09-04 NOTE — Telephone Encounter (Signed)
E-scribed refill.  Plz schedule wellness, lab and cpe visits.  

## 2020-09-06 ENCOUNTER — Encounter: Payer: Self-pay | Admitting: Family Medicine

## 2020-09-06 NOTE — Telephone Encounter (Signed)
Unable to reach patient. Phone is not accepting calls from our phone. Will mail letter. EM

## 2020-09-06 NOTE — Telephone Encounter (Signed)
Noted  

## 2020-09-25 ENCOUNTER — Telehealth: Payer: Self-pay | Admitting: Family Medicine

## 2020-09-26 ENCOUNTER — Encounter: Payer: Self-pay | Admitting: Family Medicine

## 2020-09-26 NOTE — Telephone Encounter (Signed)
Called both phones several times. They were no longer in service or stayed busy. Unable to reach patient to schedule will mail letter to patient.EM

## 2020-09-26 NOTE — Telephone Encounter (Signed)
Per Dr Synthia Innocent last OV Note, pt was to schedule Annual Exam for February and did not. I sent a 90 day supply of his meds that were requested. Please schedule him with Dr Darnell Level. Thanks

## 2020-09-26 NOTE — Telephone Encounter (Signed)
Noted  

## 2020-11-23 ENCOUNTER — Telehealth: Payer: Self-pay

## 2020-11-23 DIAGNOSIS — R262 Difficulty in walking, not elsewhere classified: Secondary | ICD-10-CM | POA: Diagnosis not present

## 2020-11-23 DIAGNOSIS — Z20822 Contact with and (suspected) exposure to covid-19: Secondary | ICD-10-CM | POA: Diagnosis not present

## 2020-11-23 DIAGNOSIS — Z9889 Other specified postprocedural states: Secondary | ICD-10-CM | POA: Diagnosis not present

## 2020-11-23 DIAGNOSIS — R9431 Abnormal electrocardiogram [ECG] [EKG]: Secondary | ICD-10-CM | POA: Diagnosis not present

## 2020-11-23 DIAGNOSIS — I5030 Unspecified diastolic (congestive) heart failure: Secondary | ICD-10-CM | POA: Diagnosis not present

## 2020-11-23 DIAGNOSIS — R0602 Shortness of breath: Secondary | ICD-10-CM | POA: Diagnosis not present

## 2020-11-23 DIAGNOSIS — I110 Hypertensive heart disease with heart failure: Secondary | ICD-10-CM | POA: Diagnosis not present

## 2020-11-23 DIAGNOSIS — Z87828 Personal history of other (healed) physical injury and trauma: Secondary | ICD-10-CM | POA: Diagnosis not present

## 2020-11-23 DIAGNOSIS — I358 Other nonrheumatic aortic valve disorders: Secondary | ICD-10-CM | POA: Diagnosis not present

## 2020-11-23 DIAGNOSIS — R600 Localized edema: Secondary | ICD-10-CM | POA: Diagnosis not present

## 2020-11-23 DIAGNOSIS — Z87891 Personal history of nicotine dependence: Secondary | ICD-10-CM | POA: Diagnosis not present

## 2020-11-23 DIAGNOSIS — E119 Type 2 diabetes mellitus without complications: Secondary | ICD-10-CM | POA: Diagnosis not present

## 2020-11-23 DIAGNOSIS — E8770 Fluid overload, unspecified: Secondary | ICD-10-CM | POA: Diagnosis not present

## 2020-11-23 DIAGNOSIS — I10 Essential (primary) hypertension: Secondary | ICD-10-CM | POA: Diagnosis not present

## 2020-11-23 DIAGNOSIS — Z79899 Other long term (current) drug therapy: Secondary | ICD-10-CM | POA: Diagnosis not present

## 2020-11-23 DIAGNOSIS — I509 Heart failure, unspecified: Secondary | ICD-10-CM | POA: Diagnosis not present

## 2020-11-23 NOTE — Telephone Encounter (Signed)
[facility] - Client TELEPHONE ADVICE RECORD [facility] Patient Name: Gregory Fisher [facility] Gender: Male DOB: [birth year] Age: 70 Y 62 M 5 D Return Phone Number: [phone] (Primary), [phone] (Secondary) Address: City/ State/ Zip: [place] [address] Client [facility] - Client Client Site [place] Physician [name] - MD Contact Type Call Who Is Calling Patient / Member / Family / Caregiver Call Type Triage / Clinical Relationship To Patient Self Return Phone Number [phone] (Secondary) Chief Complaint Leg Swelling And Edema Reason for Call Symptomatic / Request for Kieler states his pt legs and feet have been swollen for over a week. He states he has been feeling a little depressed as well since he has not lost weight like he is supposed to. No other symptoms. [place] Not Listed [facility] Translation No Nurse Assessment Nurse: Rolin Barry, RN, Levada Dy Date/Time (Eastern Time): 11/23/2020 10:11:17 AM Confirm and document reason for call. If symptomatic, describe symptoms. ---Caller states his pt legs and feet have been swollen for over a week. He states he has been feeling a little depressed as well since he has not lost weight like he is supposed to. No other symptoms. Caller advised that he lost a brother a year ago. Does the patient have any new or worsening symptoms? ---Yes Will a triage be completed? ---Yes Related visit to physician within the last 2 weeks? ---No Does the PT have any chronic conditions? (i.e. diabetes, asthma, this includes High risk factors for pregnancy, etc.) ---Yes List chronic conditions. ---depression anxiety HTN diabetes Is this a behavioral health or substance abuse call? ---No Guidelines Guideline Title Affirmed Question Affirmed Notes Nurse Date/Time Eilene Ghazi Time) Leg Swelling and Edema [1]  Difficulty breathing with exertion (e.g., walking) AND [2] new-onset or worsening Rolin Barry, RN, Levada Dy 11/23/2020 10:13:03 AM PLEASE NOTE: All timestamps contained within this report are represented as [place] Standard Time. CONFIDENTIALTY NOTICE: This fax transmission is intended only for the addressee. It contains information that is legally privileged, confidential or otherwise protected from use or disclosure. If you are not the intended recipient, you are strictly prohibited from reviewing, disclosing, copying using or disseminating any of this information or taking any action in reliance on or regarding this information. If you have received this fax in error, please notify us immediately by telephone so that we can arrange for its return to [place]. Phone: [phone], Toll-Free: [phone], Fax: [phone] Page: 2 of 2 Call Id: [id number] Maria Antonia. Time Eilene Ghazi Time) Disposition Final User 11/23/2020 10:15:52 AM Go to ED Now (or PCP triage) Yes Deaton, RN, Cindee Lame Disagree/Comply Comply Caller Understands Yes PreDisposition Did not know what to do Care Advice Given Per Guideline GO TO ED NOW (OR PCP TRIAGE): ANOTHER ADULT SHOULD DRIVE: * It is better and safer if another adult drives instead of you. CARE ADVICE given per Leg Swelling and Edema (Adult) guideline. Referrals GO TO FACILITY OTHER - SPECIFY

## 2020-11-23 NOTE — Telephone Encounter (Signed)
Noted  

## 2020-11-23 NOTE — Telephone Encounter (Signed)
Per chart review tab pt went to [facility]. Sending note to DR Baldwin Crown CMA.

## 2020-11-23 NOTE — Telephone Encounter (Signed)
Plz call Monday for update on symptoms.

## 2020-11-24 DIAGNOSIS — Z79899 Other long term (current) drug therapy: Secondary | ICD-10-CM | POA: Diagnosis not present

## 2020-11-24 DIAGNOSIS — E119 Type 2 diabetes mellitus without complications: Secondary | ICD-10-CM | POA: Diagnosis not present

## 2020-11-24 DIAGNOSIS — I509 Heart failure, unspecified: Secondary | ICD-10-CM | POA: Diagnosis not present

## 2020-11-24 DIAGNOSIS — I110 Hypertensive heart disease with heart failure: Secondary | ICD-10-CM | POA: Diagnosis not present

## 2020-11-25 DIAGNOSIS — I509 Heart failure, unspecified: Secondary | ICD-10-CM | POA: Diagnosis not present

## 2020-11-25 DIAGNOSIS — I110 Hypertensive heart disease with heart failure: Secondary | ICD-10-CM | POA: Diagnosis not present

## 2020-11-25 DIAGNOSIS — Z79899 Other long term (current) drug therapy: Secondary | ICD-10-CM | POA: Diagnosis not present

## 2020-11-25 DIAGNOSIS — E119 Type 2 diabetes mellitus without complications: Secondary | ICD-10-CM | POA: Diagnosis not present

## 2020-11-26 DIAGNOSIS — I10 Essential (primary) hypertension: Secondary | ICD-10-CM | POA: Diagnosis not present

## 2020-11-26 DIAGNOSIS — I358 Other nonrheumatic aortic valve disorders: Secondary | ICD-10-CM | POA: Diagnosis not present

## 2020-11-26 DIAGNOSIS — I5189 Other ill-defined heart diseases: Secondary | ICD-10-CM | POA: Diagnosis not present

## 2020-11-26 DIAGNOSIS — I517 Cardiomegaly: Secondary | ICD-10-CM | POA: Diagnosis not present

## 2020-11-26 DIAGNOSIS — R600 Localized edema: Secondary | ICD-10-CM | POA: Diagnosis not present

## 2020-11-26 DIAGNOSIS — E119 Type 2 diabetes mellitus without complications: Secondary | ICD-10-CM | POA: Diagnosis not present

## 2020-11-26 DIAGNOSIS — Z8679 Personal history of other diseases of the circulatory system: Secondary | ICD-10-CM | POA: Diagnosis not present

## 2020-11-26 NOTE — Telephone Encounter (Addendum)
Noted. Hospitalized with CHF exacerbation. Will await discharge.

## 2020-11-26 NOTE — Telephone Encounter (Signed)
Attempted to reach pt via cell # (not accepting calls at this time) and home # (busy signal).  Called Primary phn # ([phone]) in message. Pt's friend, Wille Glaser (no dpr), states pt is in the hospital.  FYI to Dr. Darnell Level.

## 2020-11-27 ENCOUNTER — Other Ambulatory Visit: Payer: Self-pay | Admitting: Family Medicine

## 2020-12-13 ENCOUNTER — Other Ambulatory Visit: Payer: Medicare Other

## 2020-12-14 ENCOUNTER — Telehealth: Payer: Self-pay

## 2020-12-14 ENCOUNTER — Ambulatory Visit: Payer: Medicare Other

## 2020-12-14 ENCOUNTER — Other Ambulatory Visit: Payer: Self-pay

## 2020-12-14 NOTE — Telephone Encounter (Signed)
Received faxed message via [facility] from Gregory Fisher:  Gregory Fisher got furosemide 40 mg once a day for fluid from hospital.  Gregory Fisher says he is taking 2 tabs daily because 1 tab is not working.  He is asking if Dr. Darnell Level can send new rx.

## 2020-12-14 NOTE — Telephone Encounter (Signed)
Called patient 3 times trying to complete AWV. Patient never answered. Unable to leave message because no voicemail was available. Appointment cancelled.

## 2020-12-15 MED ORDER — FUROSEMIDE 40 MG PO TABS: 40.0000 mg | ORAL_TABLET | Freq: Every morning | ORAL | 0 refills | Status: DC

## 2020-12-15 NOTE — Telephone Encounter (Signed)
Recommend he only take 1 tab as prescribed. I have refilled accordingly.  We will review at OV this week.

## 2020-12-18 ENCOUNTER — Telehealth: Payer: Self-pay | Admitting: Family Medicine

## 2020-12-18 NOTE — Telephone Encounter (Signed)
Pt called an wanted to let Dr.Gutierrez know that he feels like he needs to increase his furosemide (LASIX) 40 MG tablet to twice a day. Pt also wanted to know what the side effects are of him increasing the medication

## 2020-12-19 NOTE — Telephone Encounter (Addendum)
Too much lasix is not good for his kidneys.  When he takes lasix 40mg  does it make him use the bathroom more?  Lasix was started after [facility] hospitalization last month. I have not seen him since 05/2020. Recommend OV to review everything.

## 2020-12-19 NOTE — Telephone Encounter (Signed)
Attempted to reach pt, but cell phone is not accepting calls at this time and home phone was busy.

## 2020-12-19 NOTE — Telephone Encounter (Signed)
Tried calling patient on numerous occasions and numbers listed and it either says the caller you are calling is not accepting calls at this time or just beeps in your ear. Will try again later.

## 2020-12-19 NOTE — Telephone Encounter (Signed)
Patient left a voicemail requesting a call back regarding his medication. Patient requested a call back at [phone].

## 2020-12-19 NOTE — Telephone Encounter (Addendum)
Would aim for 48 oz fluids daily, predominantly water.  Do recommend OV prior to 03/2021 to discuss recent hospitalizations.

## 2020-12-19 NOTE — Telephone Encounter (Signed)
Patient called and stated [facility] he states that he will continue to take it. He did ask if he should continue to drink as much water as before or limit his intake due to the fluid pill.

## 2020-12-21 ENCOUNTER — Encounter: Payer: Medicare Other | Admitting: Family Medicine

## 2020-12-23 ENCOUNTER — Other Ambulatory Visit: Payer: Self-pay | Admitting: Family Medicine

## 2020-12-25 NOTE — Telephone Encounter (Signed)
Lvm asking pt to call back.  Need to relay Dr. Synthia Innocent message.  Dr. Synthia Innocent msg: Would aim for 48 oz fluids daily, predominantly water.  Do recommend OV prior to 03/2021 to discuss recent hospitalizations.

## 2020-12-26 ENCOUNTER — Telehealth: Payer: Self-pay

## 2020-12-26 NOTE — Telephone Encounter (Signed)
Lvm asking pt to call back.  Need to relay Dr. Synthia Innocent message.  Dr. Synthia Innocent msg: Would aim for 48 oz fluids daily, predominantly water.  Do recommend OV prior to 03/2021 to discuss recent hospitalizations.

## 2020-12-26 NOTE — Telephone Encounter (Signed)
Looks like pt MCR cpe was r/s to 03/12/21.   Plz try to r/s wellness and lab visits before that date.

## 2020-12-26 NOTE — Telephone Encounter (Signed)
Called patient both phone numbers and they were both busy.

## 2020-12-27 NOTE — Telephone Encounter (Signed)
Lvm asking pt to call back.  Need to relay Dr. Synthia Innocent message.  Dr. Synthia Innocent msg: Would aim for 48 oz fluids daily, predominantly water.  Do recommend OV prior to 03/2021 to discuss recent hospitalizations.

## 2020-12-28 NOTE — Telephone Encounter (Signed)
Lvm asking pt to call back.  Need to relay Dr. Synthia Innocent message.  Mailing a letter.   Dr. Synthia Innocent msg: Would aim for 48 oz fluids daily, predominantly water.  Do recommend OV prior to 03/2021 to discuss recent hospitalizations.

## 2021-01-10 ENCOUNTER — Other Ambulatory Visit: Payer: Self-pay | Admitting: Family Medicine

## 2021-02-12 ENCOUNTER — Other Ambulatory Visit: Payer: Self-pay | Admitting: Family Medicine

## 2021-02-20 ENCOUNTER — Other Ambulatory Visit: Payer: Self-pay | Admitting: Family Medicine

## 2021-03-05 ENCOUNTER — Telehealth: Payer: Self-pay | Admitting: Family Medicine

## 2021-03-05 NOTE — Progress Notes (Signed)
  Chronic Care Management   Outreach Note  03/05/2021 Name: LAZARO ISENHOWER MRN: [id number] DOB: 01-24-1951  Referred by: [name], MD Reason for referral : No chief complaint on file.   An unsuccessful telephone outreach was attempted today. The patient was referred to the pharmacist for assistance with care management and care coordination.   Follow Up Plan:   [name] Upstream Scheduler

## 2021-03-12 ENCOUNTER — Other Ambulatory Visit: Payer: Self-pay | Admitting: Family Medicine

## 2021-03-12 ENCOUNTER — Encounter: Payer: Medicare Other | Admitting: Family Medicine

## 2021-03-12 ENCOUNTER — Telehealth: Payer: Self-pay | Admitting: Family Medicine

## 2021-03-12 NOTE — Progress Notes (Signed)
  Chronic Care Management   Outreach Note  03/12/2021 Name: Gregory Fisher MRN: [id number] DOB: 08/29/1950  Referred by: [name], MD Reason for referral : No chief complaint on file.   A second unsuccessful telephone outreach was attempted today. The patient was referred to pharmacist for assistance with care management and care coordination.  Follow Up Plan:   [name] Upstream Scheduler

## 2021-03-13 NOTE — Telephone Encounter (Signed)
E-scribed refills.  Plz r/s missed AWV from 03/12/21.

## 2021-03-15 ENCOUNTER — Other Ambulatory Visit: Payer: Self-pay | Admitting: Family Medicine

## 2021-03-15 NOTE — Telephone Encounter (Signed)
CALLED PAT AND NO ANSWER

## 2021-03-15 NOTE — Telephone Encounter (Signed)
E-scribed refill.  Plz r/s AWV cpe.

## 2021-03-18 ENCOUNTER — Telehealth: Payer: Self-pay | Admitting: Family Medicine

## 2021-03-18 NOTE — Chronic Care Management (AMB) (Signed)
  Chronic Care Management   Outreach Note  03/18/2021 Name: LASHON HILLIER MRN: [id number] DOB: [birth year]  Referred by: [name], MD Reason for referral : No chief complaint on file.   Third unsuccessful telephone outreach was attempted today. The patient was referred to the pharmacist for assistance with care management and care coordination.   Follow Up Plan:   [name] Upstream Scheduler

## 2021-03-18 NOTE — Telephone Encounter (Signed)
2nd attemt to call pt to schedule but no answer

## 2021-03-29 ENCOUNTER — Other Ambulatory Visit: Payer: Self-pay | Admitting: Family Medicine

## 2021-04-26 ENCOUNTER — Other Ambulatory Visit: Payer: Self-pay | Admitting: Family Medicine

## 2021-05-07 ENCOUNTER — Other Ambulatory Visit: Payer: Self-pay | Admitting: Family Medicine

## 2021-05-14 ENCOUNTER — Other Ambulatory Visit: Payer: Self-pay | Admitting: Family Medicine

## 2021-05-15 ENCOUNTER — Encounter: Payer: Self-pay | Admitting: Family Medicine

## 2021-05-15 NOTE — Telephone Encounter (Signed)
Called pt and no voicemail set up so we sent a letter

## 2021-05-15 NOTE — Telephone Encounter (Signed)
Please call patient and schedule appointment. No showed for appointment on 03/12/21. Needs to be seen for further refills, one refill sent.

## 2021-05-19 ENCOUNTER — Other Ambulatory Visit: Payer: Self-pay | Admitting: Family Medicine

## 2021-06-05 ENCOUNTER — Telehealth: Payer: Self-pay | Admitting: Family Medicine

## 2021-06-06 NOTE — Telephone Encounter (Signed)
90 day supply of medication given to pt. He MUST schedule OV or AWV in the next 90 days.

## 2021-06-09 ENCOUNTER — Other Ambulatory Visit: Payer: Self-pay | Admitting: Family Medicine

## 2021-06-11 NOTE — Telephone Encounter (Signed)
Last filled on 03/15/21 #30 with 0 refill   LOV 06/06/20 There have been several attempts to reach patient to schedule an appointment including mailing letter. No success so far

## 2021-06-12 NOTE — Telephone Encounter (Signed)
Could not lvm for pt

## 2021-06-13 ENCOUNTER — Encounter: Payer: Self-pay | Admitting: Family Medicine

## 2021-06-13 NOTE — Telephone Encounter (Signed)
2nd Attempt  Unable to lm to schedule, letter mailed out

## 2021-07-01 ENCOUNTER — Other Ambulatory Visit: Payer: Self-pay | Admitting: Family Medicine

## 2021-07-01 MED ORDER — LORAZEPAM 1 MG PO TABS
1.0000 mg | ORAL_TABLET | Freq: Every day | ORAL | 0 refills | Status: DC | PRN
Start: 2021-07-01 — End: 2021-08-13

## 2021-07-01 NOTE — Telephone Encounter (Signed)
[facility] [place].  Needs OV prior to more refills.

## 2021-07-01 NOTE — Telephone Encounter (Signed)
Noted.  Several phn calls have been made to pt trying to schedule cpe and labs.  Also, 2 letters have been mailed.  Still no response from pt.

## 2021-07-01 NOTE — Telephone Encounter (Signed)
1.Medication Requested: LORazepam (ATIVAN) 1 MG tablet  2. Pharmacy ([place], [place]):   [id number] [place] [place] Phone:  [phone]  Fax:  [phone]      3. On Med List: Y  4. Last Visit with PCP: 12.29.21  5. Next visit date with PCP: not scheduled (unable to lvm to get next appointment scheduled)   Patient is out of medication

## 2021-07-01 NOTE — Telephone Encounter (Signed)
Name of Medication: Lorazepam Name of Pharmacy: [facility] or Written Date and Quantity: 07/13/20, #40 Last Office Visit and Type: 06/06/20, f/u Next Office Visit and Type: none Last Controlled Substance Agreement Date: none Last UDS: none

## 2021-07-10 ENCOUNTER — Other Ambulatory Visit: Payer: Self-pay | Admitting: Family Medicine

## 2021-07-16 ENCOUNTER — Other Ambulatory Visit: Payer: Self-pay | Admitting: Family Medicine

## 2021-07-28 ENCOUNTER — Other Ambulatory Visit: Payer: Self-pay | Admitting: Family Medicine

## 2021-08-12 ENCOUNTER — Other Ambulatory Visit: Payer: Self-pay | Admitting: Family Medicine

## 2021-08-12 NOTE — Telephone Encounter (Signed)
Name of Medication: Lorazepam ?Name of Pharmacy: [facility] ?Last Fill or Written Date and Quantity: 12/28/21, #40 ?Last Office Visit and Type: 06/06/20, f/u ?Next Office Visit and Type: 08/19/21, AWV ?Last Controlled Substance Agreement Date: none ?Last UDS: none ? ? ?

## 2021-08-13 NOTE — Telephone Encounter (Signed)
ERx 

## 2021-08-19 ENCOUNTER — Other Ambulatory Visit: Payer: Self-pay

## 2021-08-19 ENCOUNTER — Encounter: Payer: Self-pay | Admitting: Family Medicine

## 2021-08-19 ENCOUNTER — Ambulatory Visit (INDEPENDENT_AMBULATORY_CARE_PROVIDER_SITE_OTHER): Payer: Medicare Other | Admitting: Family Medicine

## 2021-08-19 VITALS — BP 130/70 | HR 95 | Temp 98.3°F | Resp 16 | Ht 66.5 in | Wt 285.0 lb

## 2021-08-19 DIAGNOSIS — E785 Hyperlipidemia, unspecified: Secondary | ICD-10-CM

## 2021-08-19 DIAGNOSIS — Z1211 Encounter for screening for malignant neoplasm of colon: Secondary | ICD-10-CM

## 2021-08-19 DIAGNOSIS — I10 Essential (primary) hypertension: Secondary | ICD-10-CM

## 2021-08-19 DIAGNOSIS — E559 Vitamin D deficiency, unspecified: Secondary | ICD-10-CM | POA: Diagnosis not present

## 2021-08-19 DIAGNOSIS — N400 Enlarged prostate without lower urinary tract symptoms: Secondary | ICD-10-CM | POA: Diagnosis not present

## 2021-08-19 DIAGNOSIS — E1169 Type 2 diabetes mellitus with other specified complication: Secondary | ICD-10-CM | POA: Diagnosis not present

## 2021-08-19 DIAGNOSIS — F39 Unspecified mood [affective] disorder: Secondary | ICD-10-CM

## 2021-08-19 DIAGNOSIS — I77810 Thoracic aortic ectasia: Secondary | ICD-10-CM

## 2021-08-19 DIAGNOSIS — K429 Umbilical hernia without obstruction or gangrene: Secondary | ICD-10-CM

## 2021-08-19 DIAGNOSIS — Z7189 Other specified counseling: Secondary | ICD-10-CM

## 2021-08-19 DIAGNOSIS — E118 Type 2 diabetes mellitus with unspecified complications: Secondary | ICD-10-CM

## 2021-08-19 DIAGNOSIS — I50812 Chronic right heart failure: Secondary | ICD-10-CM

## 2021-08-19 DIAGNOSIS — M8580 Other specified disorders of bone density and structure, unspecified site: Secondary | ICD-10-CM

## 2021-08-19 DIAGNOSIS — Z0000 Encounter for general adult medical examination without abnormal findings: Secondary | ICD-10-CM

## 2021-08-19 DIAGNOSIS — K760 Fatty (change of) liver, not elsewhere classified: Secondary | ICD-10-CM

## 2021-08-19 LAB — CBC WITH DIFFERENTIAL/PLATELET
Basophils Absolute: 0.1 10*3/uL (ref 0.0–0.1)
Basophils Relative: 0.6 % (ref 0.0–3.0)
Eosinophils Absolute: 0.4 10*3/uL (ref 0.0–0.7)
Eosinophils Relative: 3.9 % (ref 0.0–5.0)
HCT: 43 % (ref 39.0–52.0)
Hemoglobin: 14.7 g/dL (ref 13.0–17.0)
Lymphocytes Relative: 12.3 % (ref 12.0–46.0)
Lymphs Abs: 1.1 10*3/uL (ref 0.7–4.0)
MCHC: 34.3 g/dL (ref 30.0–36.0)
MCV: 91.2 fl (ref 78.0–100.0)
Monocytes Absolute: 0.8 10*3/uL (ref 0.1–1.0)
Monocytes Relative: 8.9 % (ref 3.0–12.0)
Neutro Abs: 6.9 10*3/uL (ref 1.4–7.7)
Neutrophils Relative %: 74.3 % (ref 43.0–77.0)
Platelets: 234 10*3/uL (ref 150.0–400.0)
RBC: 4.71 Mil/uL (ref 4.22–5.81)
RDW: 12.8 % (ref 11.5–15.5)
WBC: 9.3 10*3/uL (ref 4.0–10.5)

## 2021-08-19 LAB — LIPID PANEL
Cholesterol: 146 mg/dL (ref 0–200)
HDL: 42.1 mg/dL (ref >39.00)
LDL Cholesterol: 80 mg/dL (ref 0–99)
NonHDL: 103.86
Total CHOL/HDL Ratio: 3
Triglycerides: 120 mg/dL (ref 0.0–149.0)
VLDL: 24 mg/dL (ref 0.0–40.0)

## 2021-08-19 LAB — VITAMIN D 25 HYDROXY (VIT D DEFICIENCY, FRACTURES): VITD: 37.22 ng/mL (ref 30.00–100.00)

## 2021-08-19 LAB — COMPREHENSIVE METABOLIC PANEL
ALT: 24 U/L (ref 0–53)
AST: 18 U/L (ref 0–37)
Albumin: 4.1 g/dL (ref 3.5–5.2)
Alkaline Phosphatase: 94 U/L (ref 39–117)
BUN: 18 mg/dL (ref 6–23)
CO2: 35 mEq/L — ABNORMAL HIGH (ref 19–32)
Calcium: 9.8 mg/dL (ref 8.4–10.5)
Chloride: 96 mEq/L (ref 96–112)
Creatinine, Ser: 1.12 mg/dL (ref 0.40–1.50)
GFR: 66.64 mL/min (ref >60.00)
Glucose, Bld: 142 mg/dL — ABNORMAL HIGH (ref 70–99)
Potassium: 3.8 mEq/L (ref 3.5–5.1)
Sodium: 139 mEq/L (ref 135–145)
Total Bilirubin: 0.7 mg/dL (ref 0.2–1.2)
Total Protein: 6.5 g/dL (ref 6.0–8.3)

## 2021-08-19 LAB — POCT GLYCOSYLATED HEMOGLOBIN (HGB A1C): Hemoglobin A1C: 7.4 % — AB (ref 4.0–5.6)

## 2021-08-19 LAB — TSH: TSH: 2.12 u[IU]/mL (ref 0.35–5.50)

## 2021-08-19 LAB — MICROALBUMIN / CREATININE URINE RATIO
Creatinine,U: 36.6 mg/dL
Microalb Creat Ratio: 1.9 mg/g (ref 0.0–30.0)
Microalb, Ur: <0.7 mg/dL (ref 0.0–1.9)

## 2021-08-19 LAB — PSA: PSA: 0.21 ng/mL (ref 0.10–4.00)

## 2021-08-19 MED ORDER — ATORVASTATIN CALCIUM 80 MG PO TABS: 80.0000 mg | ORAL_TABLET | Freq: Every day | ORAL | 3 refills | Status: DC

## 2021-08-19 MED ORDER — CLONIDINE HCL 0.1 MG PO TABS: 0.1000 mg | ORAL_TABLET | Freq: Two times a day (BID) | ORAL | 3 refills | Status: DC

## 2021-08-19 MED ORDER — METFORMIN HCL 500 MG PO TABS: 500.0000 mg | ORAL_TABLET | Freq: Two times a day (BID) | ORAL | 3 refills | Status: DC

## 2021-08-19 MED ORDER — VALSARTAN-HYDROCHLOROTHIAZIDE 320-12.5 MG PO TABS: 1.0000 | ORAL_TABLET | Freq: Every day | ORAL | 3 refills | Status: DC

## 2021-08-19 MED ORDER — FUROSEMIDE 40 MG PO TABS: 40.0000 mg | ORAL_TABLET | Freq: Every day | ORAL | 3 refills | Status: DC

## 2021-08-19 MED ORDER — TAMSULOSIN HCL 0.4 MG PO CAPS: 0.8000 mg | ORAL_CAPSULE | Freq: Every day | ORAL | 3 refills | Status: DC

## 2021-08-19 MED ORDER — CARVEDILOL 12.5 MG PO TABS: 12.5000 mg | ORAL_TABLET | Freq: Two times a day (BID) | ORAL | 3 refills | Status: DC

## 2021-08-19 NOTE — Assessment & Plan Note (Signed)
Preventative protocols reviewed and updated unless pt declined. Discussed healthy diet and lifestyle.  

## 2021-08-19 NOTE — Assessment & Plan Note (Signed)
Continue vitamin D.  

## 2021-08-19 NOTE — Assessment & Plan Note (Signed)

## 2021-08-19 NOTE — Assessment & Plan Note (Addendum)
Update FLP on daily high potency statin. ?The ASCVD Risk score (Arnett DK, et al., 2019) failed to calculate for the following reasons: ?  The valid total cholesterol range is 130 to 320 mg/dL  ?

## 2021-08-19 NOTE — Assessment & Plan Note (Signed)
Continue to encourage good BP control and weight loss.  ?

## 2021-08-19 NOTE — Assessment & Plan Note (Signed)
Predominant anxiety - managed with PRN lorazepam. Discussed sparing use.  ?

## 2021-08-19 NOTE — Assessment & Plan Note (Addendum)
Advanced directive: does not have set up. Would want brother Shihab States to be HCPOA. Will work on Ecologist.

## 2021-08-19 NOTE — Assessment & Plan Note (Signed)
Update PSA s/p prostate surgery on flomax 2 tablets daily.  ?

## 2021-08-19 NOTE — Assessment & Plan Note (Signed)
Chronic, stable on current regimen - continue. 

## 2021-08-19 NOTE — Assessment & Plan Note (Signed)
Discussed weight gain noted.  ?Encouraged healthy diet and lifestyle choices to affect sustainable weight loss.  ?He is motivated to work on weight loss.  ?

## 2021-08-19 NOTE — Assessment & Plan Note (Signed)
Large, reducible, stable.  ?

## 2021-08-19 NOTE — Progress Notes (Unsigned)
Patient ID: Gregory Fisher, male    DOB: May 31, 1951, 71 y.o.   MRN: [id number]  This visit was conducted in person.  BP 130/70    Pulse 95    Temp 98.3 F (36.8 C)    Resp 16    Ht 5' 6.5" (1.689 m)    Wt 285 lb (129.3 kg)    SpO2 95%    BMI 45.31 kg/m    CC: CPE/AMW Subjective:   HPI: Gregory Fisher is a 71 y.o. male presenting on 08/19/2021 for Annual Exam   Did not see health advisor this year.  Last seen 05/2020  Stays with friend who lives in [place] - acts as caregiver.   No results found.  [facility] Office Visit from 08/19/2021 in [facility]  PHQ-2 Total Score 0       Fall Risk  07/05/2019 04/30/2018 04/23/2017 04/18/2016 04/17/2015  Falls in the past year? 0 0 No No Yes  Number falls in past yr: 0 - - - 1  Injury with Fall? 0 - - - No  Risk for fall due to : Medication side effect - - - -  Follow up Falls evaluation completed;Falls prevention discussed - - - -    Seen at ER 11/2020 for hypervolemia and BLE edema, started on lasix 40m daily. Mother with Alzheimers dementia diagnosed age 3598s- he worries about this as he's started having trouble remembering names and notes more difficulty judging spaces ie parking his car.  Preventative: Colonoscopy 2003 - iFOB negative yearly since. No fmhx of colon cancer. Will update today.  Prostate cancer - s/p cryoablation pt endorses due to BPH 2010 ([facility]). Hasn't seen urology in 2020. Will update PSA.  Lung cancer screening - not eligible Flu yearly  [place], [place], booster 05/2020  Prevnar-13 2017, pneumovax 2018 Tdap 01/2013  shingrix - discussed - to check at pharmacy  Advanced directive: does not have set up. Would want brother GDuel Conradto be [place]CPOA[place] 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 Interim medical history since our last visit reviewed. Allergies and medications reviewed and updated. Outpatient Medications Prior to Visit  Medication Sig Dispense Refill   cyanocobalamin 1000 MCG tablet Take 1,000 mcg by mouth daily.     aspirin EC 81 MG tablet Take 81 mg by mouth daily.      Cholecalciferol (VITAMIN D3) 25 MCG (1000 UT) CAPS Take 2 capsules (2,000 Units total) by mouth daily. 30 capsule    LORazepam (ATIVAN) 1 MG tablet TAKE 1 TABLET (1 MG TOTAL) BY MOUTH DAILY AS NEEDED FOR ANXIETY 40 tablet 0   magnesium hydroxide (MILK OF MAGNESIA) 400 MG/5ML suspension Take 5 mL by mouth daily as needed for constipation.     Naproxen Sodium (ALEVE PO) Take 1 tablet by mouth daily as needed.  vitamin C (ASCORBIC ACID) 500 MG tablet Take 500 mg by mouth daily.     atorvastatin (LIPITOR) 80 MG tablet TAKE 1 TABLET BY MOUTH ONCE DAILY 90 tablet 0   carvedilol (COREG) 12.5 MG tablet TAKE 1 TABLET BY MOUTH  TWICE DAILY WITH MEALS 180 tablet 0   cloNIDine (CATAPRES) 0.1 MG tablet TAKE 1 TABLET BY MOUTH  TWICE DAILY 180 tablet 0   furosemide (LASIX) 40 MG tablet TAKE 1 TABLET BY MOUTH EVERY DAY IN THE MORNING 30 tablet 2   metFORMIN (GLUCOPHAGE) 500 MG tablet TAKE 1 TABLET BY MOUTH TWICE  DAILY WITH MEALS 180 tablet 0   tamsulosin (FLOMAX) 0.4 MG CAPS capsule TAKE 2 CAPSULES BY MOUTH AT BEDTIME 180 capsule 3   traMADol (ULTRAM) 50 MG tablet TAKE 1 TO 2 TABLETS BY MOUTH EVERY 6 HOURS AS NEEDED 60 tablet 0   valsartan-hydrochlorothiazide (DIOVAN-HCT) 320-12.5 MG tablet TAKE 1 TABLET BY MOUTH  DAILY 90 tablet 0   No  facility-administered medications prior to visit.     Per HPI unless specifically indicated in ROS section below Review of Systems  Constitutional:  Negative for activity change, appetite change, chills, fatigue, fever and unexpected weight change.  HENT:  Negative for hearing loss.   Eyes:  Negative for visual disturbance.  Respiratory:  Positive for shortness of breath. Negative for cough, chest tightness and wheezing.   Cardiovascular:  Positive for leg swelling. Negative for chest pain and palpitations.  Gastrointestinal:  Negative for abdominal distention, abdominal pain, blood in stool, constipation, diarrhea, nausea and vomiting.  Genitourinary:  Negative for difficulty urinating and hematuria.  Musculoskeletal:  Negative for arthralgias, myalgias and neck pain.  Skin:  Negative for rash.  Neurological:  Positive for numbness (chronic R arm ulnar distribution numbness). Negative for dizziness, seizures, syncope and headaches.  Hematological:  Negative for adenopathy. Does not bruise/bleed easily.  Psychiatric/Behavioral:  Negative for dysphoric mood. The patient is not nervous/anxious.    Objective:  BP 130/70    Pulse 95    Temp 98.3 F (36.8 C)    Resp 16    Ht 5' 6.5" (1.689 m)    Wt 285 lb (129.3 kg)    SpO2 95%    BMI 45.31 kg/m   Wt Readings from Last 3 Encounters:  08/19/21 285 lb (129.3 kg)  06/06/20 271 lb (122.9 kg)  01/11/20 288 lb 3 oz (130.7 kg)      Physical Exam Vitals and nursing note reviewed.  Constitutional:      General: He is not in acute distress.    Appearance: Normal appearance. He is well-developed. He is not ill-appearing.  HENT:     Head: Normocephalic and atraumatic.     Right Ear: Hearing, tympanic membrane, ear canal and external ear normal.     Left Ear: Hearing, tympanic membrane, ear canal and external ear normal.  Eyes:     General: No scleral icterus.    Extraocular Movements: Extraocular movements intact.     Conjunctiva/sclera:  Conjunctivae normal.     Pupils: Pupils are equal, round, and reactive to light.  Neck:     Thyroid: No thyroid mass or thyromegaly.     Vascular: No carotid bruit.  Cardiovascular:     Rate and Rhythm: Normal rate and regular rhythm.     Pulses: Normal pulses.          Radial pulses are 2+ on the right side and 2+ on the left side.  Heart sounds: Normal heart sounds. No murmur heard. Pulmonary:     Effort: Pulmonary effort is normal. No respiratory distress.     Breath sounds: Normal breath sounds. No wheezing, rhonchi or rales.  Abdominal:     General: Abdomen is protuberant. Bowel sounds are normal. There is distension.     Palpations: Abdomen is soft. There is no mass.     Tenderness: There is no abdominal tenderness. There is no guarding or rebound.     Hernia: A hernia is present. Hernia is present in the umbilical area (small, reducible).  Musculoskeletal:        General: Normal range of motion.     Cervical back: Normal range of motion and neck supple.     Right lower leg: No edema.     Left lower leg: No edema.  Lymphadenopathy:     Cervical: No cervical adenopathy.  Skin:    General: Skin is warm and dry.     Findings: No rash.  Neurological:     General: No focal deficit present.     Mental Status: He is alert and oriented to person, place, and time.     Comments:  Recall 3/3 Calculation DLROW  Psychiatric:        Mood and Affect: Mood normal.        Behavior: Behavior normal.        Thought Content: Thought content normal.        Judgment: Judgment normal.      Results for orders placed or performed in visit on 08/19/21  POCT glycosylated hemoglobin (Hb A1C)  Result Value Ref Range   Hemoglobin A1C 7.4 (A) 4.0 - 5.6 %   HbA1c POC (<> result, manual entry)     HbA1c, POC (prediabetic range)     HbA1c, POC (controlled diabetic range)      Assessment & Plan:  This visit occurred during the SARS-CoV-2 public health emergency.  Safety protocols were in  place, including screening questions prior to the visit, additional usage of staff PPE, and extensive cleaning of exam room while observing appropriate contact time as indicated for disinfecting solutions.   Problem List Items Addressed This Visit     Healthcare maintenance (Chronic)    Preventative protocols reviewed and updated unless pt declined. Discussed healthy diet and lifestyle.       Medicare annual wellness visit, subsequent - Primary (Chronic)    I have personally reviewed the Medicare Annual Wellness questionnaire and have noted 1. The patient's medical and social history 2. Their use of alcohol, tobacco or illicit drugs 3. Their current medications and supplements 4. The patient's functional ability including ADL's, fall risks, home safety risks and hearing or visual impairment. Cognitive function has been assessed and addressed as indicated.  5. Diet and physical activity 6. Evidence for depression or mood disorders The patients weight, height, BMI have been recorded in the chart. I have made referrals, counseling and provided education to the patient based on review of the above and I have provided the pt with a written personalized care plan for preventive services. Provider list updated.. See scanned questionairre as needed for further documentation. Reviewed preventative protocols and updated unless pt declined.       Advanced care planning/counseling discussion (Chronic)    Advanced directive: does not have set up. Would want brother Gregory Fisher to be HCPOA. Will work on Ecologist.       HTN (hypertension)    Chronic, stable on  current regimen - continue.       Relevant Medications   atorvastatin (LIPITOR) 80 MG tablet   carvedilol (COREG) 12.5 MG tablet   cloNIDine (CATAPRES) 0.1 MG tablet   furosemide (LASIX) 40 MG tablet   valsartan-hydrochlorothiazide (DIOVAN-HCT) 320-12.5 MG tablet   Hyperlipidemia associated with type 2 diabetes  mellitus ([place])    Update FLP on daily high potency statin. The ASCVD Risk score (Arnett DK, et al., 2019) failed to calculate for the following reasons:   The valid total cholesterol range is 130 to 320 mg/dL       Relevant Medications   atorvastatin (LIPITOR) 80 MG tablet   carvedilol (COREG) 12.5 MG tablet   cloNIDine (CATAPRES) 0.1 MG tablet   furosemide (LASIX) 40 MG tablet   metFORMIN (GLUCOPHAGE) 500 MG tablet   valsartan-hydrochlorothiazide (DIOVAN-HCT) 320-12.5 MG tablet   Other Relevant Orders   TSH   Vitamin D deficiency    Update levels on daily vitamin D 2000 IU      Osteopenia    Continue vitamin D.       Ascending aorta dilation (HCC)    Overdue for rpt imaging - will order CTA after we check function.       Relevant Medications   atorvastatin (LIPITOR) 80 MG tablet   carvedilol (COREG) 12.5 MG tablet   cloNIDine (CATAPRES) 0.1 MG tablet   furosemide (LASIX) 40 MG tablet   valsartan-hydrochlorothiazide (DIOVAN-HCT) 320-12.5 MG tablet   Other Relevant Orders   CT Angio Chest W/Cm &/Or Wo Cm   Benign prostatic hyperplasia    Update PSA s/p prostate surgery on flomax 2 tablets daily.       Relevant Medications   tamsulosin (FLOMAX) 0.4 MG CAPS capsule   Other Relevant Orders   PSA   CBC with Differential/Platelet   Mood disorder ([place])    Predominant anxiety - managed with PRN lorazepam. Discussed sparing use.       Controlled diabetes mellitus type 2 with complications (HCC)    Chronic, stable on metformin. Update POC A1c today.  Did not like ozempic - had difficulty with self injections. Consider SGLT2i.      Relevant Medications   atorvastatin (LIPITOR) 80 MG tablet   metFORMIN (GLUCOPHAGE) 500 MG tablet   valsartan-hydrochlorothiazide (DIOVAN-HCT) 320-12.5 MG tablet   Other Relevant Orders   POCT glycosylated hemoglobin (Hb A1C) (Completed)   Microalbumin / creatinine urine ratio   Obesity, morbid, BMI 40.0-49.9 ([place])    Discussed weight gain  noted.  Encouraged healthy diet and lifestyle choices to affect sustainable weight loss.  He is motivated to work on weight loss.       Relevant Medications   metFORMIN (GLUCOPHAGE) 500 MG tablet   Umbilical hernia    Large, reducible, stable.       Fatty liver   Chronic right heart failure (HCC)    Continue to encourage good BP control and weight loss.       Relevant Medications   atorvastatin (LIPITOR) 80 MG tablet   carvedilol (COREG) 12.5 MG tablet   cloNIDine (CATAPRES) 0.1 MG tablet   furosemide (LASIX) 40 MG tablet   valsartan-hydrochlorothiazide (DIOVAN-HCT) 320-12.5 MG tablet   Other Visit Diagnoses     Special screening for malignant neoplasms, colon       Relevant Orders   Fecal occult blood, imunochemical        Meds ordered this encounter  Medications   atorvastatin (LIPITOR) 80 MG tablet    Sig: Take  1 tablet (80 mg total) by mouth daily.    Dispense:  90 tablet    Refill:  3   carvedilol (COREG) 12.5 MG tablet    Sig: Take 1 tablet (12.5 mg total) by mouth 2 (two) times daily with a meal.    Dispense:  180 tablet    Refill:  3   cloNIDine (CATAPRES) 0.1 MG tablet    Sig: Take 1 tablet (0.1 mg total) by mouth 2 (two) times daily.    Dispense:  180 tablet    Refill:  3   furosemide (LASIX) 40 MG tablet    Sig: Take 1 tablet (40 mg total) by mouth daily.    Dispense:  90 tablet    Refill:  3   metFORMIN (GLUCOPHAGE) 500 MG tablet    Sig: Take 1 tablet (500 mg total) by mouth 2 (two) times daily with a meal.    Dispense:  180 tablet    Refill:  3   tamsulosin (FLOMAX) 0.4 MG CAPS capsule    Sig: Take 2 capsules (0.8 mg total) by mouth at bedtime.    Dispense:  180 capsule    Refill:  3   valsartan-hydrochlorothiazide (DIOVAN-HCT) 320-12.5 MG tablet    Sig: Take 1 tablet by mouth daily.    Dispense:  90 tablet    Refill:  3   Orders Placed This Encounter  Procedures   Fecal occult blood, imunochemical    Standing Status:   Future     Standing Expiration Date:   08/20/2022   CT Angio Chest W/Cm &/Or Wo Cm    Standing Status:   Future    Standing Expiration Date:   08/20/2022    Order Specific Question:   If indicated for the ordered procedure, I authorize the administration of contrast media per Radiology protocol    Answer:   Yes    Order Specific Question:   Preferred imaging location?    Answer:   [facility]   PSA   CBC with Differential/Platelet   TSH   Microalbumin / creatinine urine ratio   POCT glycosylated hemoglobin (Hb A1C)     Patient instructions: Pass by lab for stool kit. Hearing and vision screens today.  Labs today  Schedule diabetic eye exam as you're due.  Work on advanced directive - packet provided today.  If interested, check with pharmacy about new 2 shot shingles series (shingrix).  Send me dates of COVID shots to update your chart.  Return in 3-4 months for diabetes follow up visit.   Follow up plan: Return in about 4 months (around 12/19/2021) for follow up visit.  [name], MD

## 2021-08-19 NOTE — Assessment & Plan Note (Addendum)
Chronic, stable on metformin. Update POC A1c today.  ?Did not like ozempic - had difficulty with self injections. Consider SGLT2i. ?

## 2021-08-19 NOTE — Assessment & Plan Note (Signed)
Overdue for rpt imaging - will order CTA after we check function.  ?

## 2021-08-19 NOTE — Patient Instructions (Addendum)
Pass by lab for stool kit. ?Hearing and vision screens today.  ?Labs today  ?Schedule diabetic eye exam as you're due.  ?Work on advanced directive - packet provided today.  ?If interested, check with pharmacy about new 2 shot shingles series (shingrix).  ?Send me dates of COVID shots to update your chart.  ?Return in 3-4 months for diabetes follow up visit.  ? ?Health Maintenance After Age 71 ?After age 25, you are at a higher risk for certain long-term diseases and infections as well as injuries from falls. Falls are a major cause of broken bones and head injuries in people who are older than age 24. Getting regular preventive care can help to keep you healthy and well. Preventive care includes getting regular testing and making lifestyle changes as recommended by your health care provider. Talk with your health care provider about: ?Which screenings and tests you should have. A screening is a test that checks for a disease when you have no symptoms. ?A diet and exercise plan that is right for you. ?What should I know about screenings and tests to prevent falls? ?Screening and testing are the best ways to find a health problem early. Early diagnosis and treatment give you the best chance of managing medical conditions that are common after age 32. Certain conditions and lifestyle choices may make you more likely to have a fall. Your health care provider may recommend: ?Regular vision checks. Poor vision and conditions such as cataracts can make you more likely to have a fall. If you wear glasses, make sure to get your prescription updated if your vision changes. ?Medicine review. Work with your health care provider to regularly review all of the medicines you are taking, including over-the-counter medicines. Ask your health care provider about any side effects that may make you more likely to have a fall. Tell your health care provider if any medicines that you take make you feel dizzy or sleepy. ?Strength and  balance checks. Your health care provider may recommend certain tests to check your strength and balance while standing, walking, or changing positions. ?Foot health exam. Foot pain and numbness, as well as not wearing proper footwear, can make you more likely to have a fall. ?Screenings, including: ?Osteoporosis screening. Osteoporosis is a condition that causes the bones to get weaker and break more easily. ?Blood pressure screening. Blood pressure changes and medicines to control blood pressure can make you feel dizzy. ?Depression screening. You may be more likely to have a fall if you have a fear of falling, feel depressed, or feel unable to do activities that you used to do. ?Alcohol use screening. Using too much alcohol can affect your balance and may make you more likely to have a fall. ?Follow these instructions at home: ?Lifestyle ?Do not drink alcohol if: ?Your health care provider tells you not to drink. ?If you drink alcohol: ?Limit how much you have to: ?0-1 drink a day for women. ?0-2 drinks a day for men. ?Know how much alcohol is in your drink. In the [place] one drink equals one 12 oz bottle of beer (355 mL), one 5 oz glass of wine (148 mL), or one 1? oz glass of hard liquor (44 mL). ?Do not use any products that contain nicotine or tobacco. These products include cigarettes, chewing tobacco, and vaping devices, such as e-cigarettes. If you need help quitting, ask your health care provider. ?Activity ? ?Follow a regular exercise program to stay fit. This will help you maintain your  balance. Ask your health care provider what types of exercise are appropriate for you. ?If you need a cane or walker, use it as recommended by your health care provider. ?Wear supportive shoes that have nonskid soles. ?Safety ? ?Remove any tripping hazards, such as rugs, cords, and clutter. ?Install safety equipment such as grab bars in bathrooms and safety rails on stairs. ?Keep rooms and walkways well-lit. ?General  instructions ?Talk with your health care provider about your risks for falling. Tell your health care provider if: ?You fall. Be sure to tell your health care provider about all falls, even ones that seem minor. ?You feel dizzy, tiredness (fatigue), or off-balance. ?Take over-the-counter and prescription medicines only as told by your health care provider. These include supplements. ?Eat a healthy diet and maintain a healthy weight. A healthy diet includes low-fat dairy products, low-fat (lean) meats, and fiber from whole grains, beans, and lots of fruits and vegetables. ?Stay current with your vaccines. ?Schedule regular health, dental, and eye exams. ?Summary ?Having a healthy lifestyle and getting preventive care can help to protect your health and wellness after age 89. ?Screening and testing are the best way to find a health problem early and help you avoid having a fall. Early diagnosis and treatment give you the best chance for managing medical conditions that are more common for people who are older than age 42. ?Falls are a major cause of broken bones and head injuries in people who are older than age 59. Take precautions to prevent a fall at home. ?Work with your health care provider to learn what changes you can make to improve your health and wellness and to prevent falls. ?This information is not intended to replace advice given to you by your health care provider. Make sure you discuss any questions you have with your health care provider. ?Document Revised: 10/15/2020 Document Reviewed: 10/15/2020 ?Elsevier Patient Education ? [place]. ? ?

## 2021-08-19 NOTE — Assessment & Plan Note (Signed)
Update levels on daily vitamin D 2000 IU ?

## 2021-08-20 MED ORDER — DAPAGLIFLOZIN PROPANEDIOL 5 MG PO TABS: 5.0000 mg | ORAL_TABLET | Freq: Every day | ORAL | 11 refills | Status: DC

## 2021-08-28 ENCOUNTER — Telehealth: Payer: Self-pay | Admitting: Family Medicine

## 2021-08-28 NOTE — Telephone Encounter (Signed)
Script was sent to pharmacy on 08/20/21 #30/11 refills. ?Spoke to pharmacist at [facility] and was advised that they have had the prescription since 08/20/21. ?Tried to call patient and received a message that call can not be completed at this time and to call back again later. ?

## 2021-08-28 NOTE — Telephone Encounter (Signed)
Pt called stating that Dr Darnell Level prescribed him medication farxiga '5mg'$  daily. Pt states that its not at the Pharmacy. Pt states he would like it sent to [id number] [place] [place] [address] Please advise. ?

## 2021-08-30 ENCOUNTER — Ambulatory Visit: Admission: RE | Admit: 2021-08-30 | Payer: Medicare Other | Source: Ambulatory Visit

## 2021-09-19 ENCOUNTER — Telehealth: Payer: Self-pay

## 2021-09-19 NOTE — Telephone Encounter (Addendum)
  New message  The patient asked the CMA to call him back to discuss a laxative medication - Berberine supplement

## 2021-09-19 NOTE — Telephone Encounter (Signed)
Rtn pt's call.  Pt states he was told from a friend about berberine supplement and that it's good for the digestive system and a good laxative.  Pt wants to know if this is true and is he ok to start supplement.  Informed pt Dr. Darnell Level is out of the office but the message will be fwd to him and we'll call pt back once we get a response from Dr. Darnell Level.  Pt expresses his thanks.  ?

## 2021-09-20 DIAGNOSIS — Z79899 Other long term (current) drug therapy: Secondary | ICD-10-CM | POA: Diagnosis not present

## 2021-09-20 DIAGNOSIS — K567 Ileus, unspecified: Secondary | ICD-10-CM | POA: Diagnosis not present

## 2021-09-20 DIAGNOSIS — D3502 Benign neoplasm of left adrenal gland: Secondary | ICD-10-CM | POA: Diagnosis not present

## 2021-09-20 DIAGNOSIS — K560 Paralytic ileus: Secondary | ICD-10-CM | POA: Diagnosis not present

## 2021-09-20 DIAGNOSIS — Z20822 Contact with and (suspected) exposure to covid-19: Secondary | ICD-10-CM | POA: Diagnosis not present

## 2021-09-20 DIAGNOSIS — E119 Type 2 diabetes mellitus without complications: Secondary | ICD-10-CM | POA: Diagnosis not present

## 2021-09-20 DIAGNOSIS — K5730 Diverticulosis of large intestine without perforation or abscess without bleeding: Secondary | ICD-10-CM | POA: Diagnosis not present

## 2021-09-20 DIAGNOSIS — Z609 Problem related to social environment, unspecified: Secondary | ICD-10-CM | POA: Diagnosis not present

## 2021-09-20 DIAGNOSIS — Z7984 Long term (current) use of oral hypoglycemic drugs: Secondary | ICD-10-CM | POA: Diagnosis not present

## 2021-09-20 DIAGNOSIS — Z7982 Long term (current) use of aspirin: Secondary | ICD-10-CM | POA: Diagnosis not present

## 2021-09-20 DIAGNOSIS — K429 Umbilical hernia without obstruction or gangrene: Secondary | ICD-10-CM | POA: Diagnosis not present

## 2021-09-20 DIAGNOSIS — Z87891 Personal history of nicotine dependence: Secondary | ICD-10-CM | POA: Diagnosis not present

## 2021-09-20 DIAGNOSIS — I10 Essential (primary) hypertension: Secondary | ICD-10-CM | POA: Diagnosis not present

## 2021-09-20 DIAGNOSIS — E876 Hypokalemia: Secondary | ICD-10-CM | POA: Diagnosis not present

## 2021-09-20 DIAGNOSIS — E278 Other specified disorders of adrenal gland: Secondary | ICD-10-CM | POA: Diagnosis not present

## 2021-09-20 DIAGNOSIS — N179 Acute kidney failure, unspecified: Secondary | ICD-10-CM | POA: Diagnosis not present

## 2021-09-20 DIAGNOSIS — E860 Dehydration: Secondary | ICD-10-CM | POA: Diagnosis not present

## 2021-09-20 DIAGNOSIS — R143 Flatulence: Secondary | ICD-10-CM | POA: Diagnosis not present

## 2021-09-20 DIAGNOSIS — K5900 Constipation, unspecified: Secondary | ICD-10-CM | POA: Diagnosis not present

## 2021-09-20 DIAGNOSIS — I700 Atherosclerosis of aorta: Secondary | ICD-10-CM | POA: Diagnosis not present

## 2021-09-20 DIAGNOSIS — E279 Disorder of adrenal gland, unspecified: Secondary | ICD-10-CM | POA: Diagnosis not present

## 2021-09-23 NOTE — Telephone Encounter (Signed)
Attempted to contact pt.  No answer.  No vm.  Need to relay Dr. Synthia Innocent message and get answer to his question. ?

## 2021-09-23 NOTE — Telephone Encounter (Signed)
Berberine can help cholesterol levels but side effects can be diarrhea or constipation, upset stomach, gassiness.  ?I'm not sure about it's laxative effect other that the diarrhea side effect. He could try but if side effects develop then stop it.  ?Is he having trouble with constipation?  ?

## 2021-09-25 ENCOUNTER — Ambulatory Visit: Admission: RE | Admit: 2021-09-25 | Payer: Medicare Other | Source: Ambulatory Visit

## 2021-09-25 NOTE — Telephone Encounter (Signed)
Tried to call patient on his cell phone and home phone. No answer or voicemail on either phone. Will have to try to reach patient later.  ?

## 2021-09-26 NOTE — Telephone Encounter (Signed)
Attempted to contact pt.  No answer.  No vm.  Need to relay Dr. Synthia Innocent message and get answer to his question.  Mailing a letter.  ?

## 2021-09-29 ENCOUNTER — Encounter: Payer: Self-pay | Admitting: Family Medicine

## 2021-09-29 DIAGNOSIS — D3502 Benign neoplasm of left adrenal gland: Secondary | ICD-10-CM | POA: Insufficient documentation

## 2021-10-07 DIAGNOSIS — K429 Umbilical hernia without obstruction or gangrene: Secondary | ICD-10-CM | POA: Diagnosis not present

## 2021-10-16 ENCOUNTER — Other Ambulatory Visit: Payer: Self-pay | Admitting: Family Medicine

## 2021-11-20 ENCOUNTER — Encounter: Payer: Self-pay | Admitting: Family Medicine

## 2021-11-20 ENCOUNTER — Ambulatory Visit (INDEPENDENT_AMBULATORY_CARE_PROVIDER_SITE_OTHER): Payer: Medicare Other | Admitting: Family Medicine

## 2021-11-20 ENCOUNTER — Other Ambulatory Visit: Payer: Self-pay | Admitting: Family Medicine

## 2021-11-20 VITALS — BP 134/78 | HR 78 | Temp 98.3°F | Ht 66.5 in | Wt 290.5 lb

## 2021-11-20 DIAGNOSIS — E1169 Type 2 diabetes mellitus with other specified complication: Secondary | ICD-10-CM

## 2021-11-20 DIAGNOSIS — K429 Umbilical hernia without obstruction or gangrene: Secondary | ICD-10-CM

## 2021-11-20 DIAGNOSIS — I50812 Chronic right heart failure: Secondary | ICD-10-CM | POA: Diagnosis not present

## 2021-11-20 DIAGNOSIS — I77810 Thoracic aortic ectasia: Secondary | ICD-10-CM

## 2021-11-20 DIAGNOSIS — W19XXXA Unspecified fall, initial encounter: Secondary | ICD-10-CM | POA: Diagnosis not present

## 2021-11-20 DIAGNOSIS — Z5982 Transportation insecurity: Secondary | ICD-10-CM | POA: Diagnosis not present

## 2021-11-20 DIAGNOSIS — D3502 Benign neoplasm of left adrenal gland: Secondary | ICD-10-CM

## 2021-11-20 DIAGNOSIS — L03115 Cellulitis of right lower limb: Secondary | ICD-10-CM | POA: Diagnosis not present

## 2021-11-20 LAB — BRAIN NATRIURETIC PEPTIDE: Pro B Natriuretic peptide (BNP): 18 pg/mL (ref 0.0–100.0)

## 2021-11-20 LAB — COMPREHENSIVE METABOLIC PANEL
ALT: 20 U/L (ref 0–53)
AST: 18 U/L (ref 0–37)
Albumin: 4 g/dL (ref 3.5–5.2)
Alkaline Phosphatase: 99 U/L (ref 39–117)
BUN: 17 mg/dL (ref 6–23)
CO2: 32 mEq/L (ref 19–32)
Calcium: 9.4 mg/dL (ref 8.4–10.5)
Chloride: 97 mEq/L (ref 96–112)
Creatinine, Ser: 1.16 mg/dL (ref 0.40–1.50)
GFR: 63.78 mL/min (ref >60.00)
Glucose, Bld: 125 mg/dL — ABNORMAL HIGH (ref 70–99)
Potassium: 3.7 mEq/L (ref 3.5–5.1)
Sodium: 138 mEq/L (ref 135–145)
Total Bilirubin: 0.7 mg/dL (ref 0.2–1.2)
Total Protein: 6.7 g/dL (ref 6.0–8.3)

## 2021-11-20 LAB — CBC WITH DIFFERENTIAL/PLATELET
Basophils Absolute: 0 10*3/uL (ref 0.0–0.1)
Basophils Relative: 0.7 % (ref 0.0–3.0)
Eosinophils Absolute: 0.4 10*3/uL (ref 0.0–0.7)
Eosinophils Relative: 5.6 % — ABNORMAL HIGH (ref 0.0–5.0)
HCT: 38.9 % — ABNORMAL LOW (ref 39.0–52.0)
Hemoglobin: 13.2 g/dL (ref 13.0–17.0)
Lymphocytes Relative: 16.3 % (ref 12.0–46.0)
Lymphs Abs: 1 10*3/uL (ref 0.7–4.0)
MCHC: 33.9 g/dL (ref 30.0–36.0)
MCV: 92.2 fl (ref 78.0–100.0)
Monocytes Absolute: 0.6 10*3/uL (ref 0.1–1.0)
Monocytes Relative: 9.4 % (ref 3.0–12.0)
Neutro Abs: 4.4 10*3/uL (ref 1.4–7.7)
Neutrophils Relative %: 68 % (ref 43.0–77.0)
Platelets: 231 10*3/uL (ref 150.0–400.0)
RBC: 4.23 Mil/uL (ref 4.22–5.81)
RDW: 13.5 % (ref 11.5–15.5)
WBC: 6.4 10*3/uL (ref 4.0–10.5)

## 2021-11-20 LAB — POCT GLYCOSYLATED HEMOGLOBIN (HGB A1C): Hemoglobin A1C: 7 % — AB (ref 4.0–5.6)

## 2021-11-20 MED ORDER — DOXYCYCLINE HYCLATE 100 MG PO TABS: 100.0000 mg | ORAL_TABLET | Freq: Two times a day (BID) | ORAL | 0 refills | Status: DC

## 2021-11-20 MED ORDER — BLOOD GLUCOSE MONITOR KIT: PACK | 0 refills | Status: DC

## 2021-11-20 NOTE — Progress Notes (Signed)
Patient ID: Gregory Fisher, male    DOB: 07/28/1950, 71 y.o.   MRN: [id number]  This visit was conducted in person.  BP 134/78   Pulse 78   Temp 98.3 F (36.8 C) (Temporal)   Ht 5' 6.5" (1.689 m)   Wt 290 lb 8 oz (131.8 kg)   SpO2 97%   BMI 46.19 kg/m    CC: fall with injury, DM f/u Subjective:   HPI: Gregory Fisher is a 71 y.o. male presenting on 11/20/2021 for Diabetes (Here for 3 mo f/u.) and Fall (Pt fell 11/17/21 injuring anterior lower R leg.  Area is red and has abrasions. )  DOI: fall "a couple days ago" Going down stairs at home, hit R anterior leg on concrete.  Injured R lower leg with residual redness and abrasions. Treating with neosporin.  Both legs remain swollen. 20 lb weight gain since December. This is despite furosemide 40mg  daily. Doesn't feel 40mg  lasix provides good UOP.  He drinks coffee > water.  Due for colonoscopy, has not submitted iFOB.  Notes difficulty with memory over last several years. Mother with Alz dementia.  He's not been taking aspirin.   Hospitalized 09/2021 at [facility] for severe constipation after eating cheese. Dx umbilical hernia - rec weight loss and sugar control prior to considering surgical repair. Had ARF with Cr up to 2 during hospitalization, had normalized prior to discharge.  From that hospitalization:  CT Abdomen Pelvis W IV Contrast only 09/20/2021 1. Multiple intermittently mildly dilated small bowel loops with  fecalization, without discrete transition point, suggestive of  ileus.  2. Small umbilical hernia containing a non-dilated loop of small  bowel.  3. 1.1 cm left adrenal nodule, indeterminate, probably benign  adenoma. Recommend 1 year follow up adrenal washout CT. If stable  for > 1 year, no further follow-up imaging. JACR 2017 Aug;  14(8):1038-44, JCAT 2016 Mar-Apr; 40(2):194-200, Urol J 2006 Spring;  3(2):71-4.  4. Aortic Atherosclerosis (ICD10-I70.0).   DM - does not regularly check sugars. Compliant  with antihyperglycemic regimen which includes: farxiga 5mg  daily, metformin 500mg  bid. Was unable to do ozempic injection. Denies low sugars or hypoglycemic symptoms. Denies paresthesias, blurry vision. Last diabetic eye exam 08/2018 - DUE. Glucometer brand: does not have. Last foot exam: 05/2020 - DUE. DSME: declines.  Lab Results  Component Value Date   HGBA1C 7.0 (A) 11/20/2021   Diabetic Foot Exam - Simple   Simple Foot Form Diabetic Foot exam was performed with the following findings: Yes 11/21/2021  9:16 AM  Visual Inspection No deformities, no ulcerations, no other skin breakdown bilaterally: Yes Sensation Testing Intact to touch and monofilament testing bilaterally: Yes Pulse Check Posterior Tibialis and Dorsalis pulse intact bilaterally: Yes Comments + stemmer sign    Lab Results  Component Value Date   MICROALBUR <0.7 08/19/2021         Relevant past medical, surgical, family and social history reviewed and updated as indicated. Interim medical history since our last visit reviewed. Allergies and medications reviewed and updated. Outpatient Medications Prior to Visit  Medication Sig Dispense Refill   aspirin EC 81 MG tablet Take 81 mg by mouth daily.      atorvastatin (LIPITOR) 80 MG tablet Take 1 tablet (80 mg total) by mouth daily. 90 tablet 3   carvedilol (COREG) 12.5 MG tablet Take 1 tablet (12.5 mg total) by mouth 2 (two) times daily with a meal. 180 tablet 3   Cholecalciferol (VITAMIN D3) 25  MCG (1000 UT) CAPS Take 2 capsules (2,000 Units total) by mouth daily. 30 capsule    cloNIDine (CATAPRES) 0.1 MG tablet Take 1 tablet (0.1 mg total) by mouth 2 (two) times daily. 180 tablet 3   cyanocobalamin 1000 MCG tablet Take 1,000 mcg by mouth daily.     dapagliflozin propanediol (FARXIGA) 5 MG TABS tablet Take 1 tablet (5 mg total) by mouth daily before breakfast. 30 tablet 11   furosemide (LASIX) 40 MG tablet Take 1 tablet (40 mg total) by mouth daily. 90 tablet 3    LORazepam (ATIVAN) 1 MG tablet TAKE 1 TABLET (1 MG TOTAL) BY MOUTH DAILY AS NEEDED FOR ANXIETY 40 tablet 0   magnesium hydroxide (MILK OF MAGNESIA) 400 MG/5ML suspension Take 5 mL by mouth daily as needed for constipation.     metFORMIN (GLUCOPHAGE) 500 MG tablet Take 1 tablet (500 mg total) by mouth 2 (two) times daily with a meal. 180 tablet 3   Naproxen Sodium (ALEVE PO) Take 1 tablet by mouth daily as needed.     tamsulosin (FLOMAX) 0.4 MG CAPS capsule Take 2 capsules (0.8 mg total) by mouth at bedtime. 180 capsule 3   valsartan-hydrochlorothiazide (DIOVAN-HCT) 320-12.5 MG tablet Take 1 tablet by mouth daily. 90 tablet 3   vitamin C (ASCORBIC ACID) 500 MG tablet Take 500 mg by mouth daily. (Patient not taking: Reported on 11/20/2021)     No facility-administered medications prior to visit.     Per HPI unless specifically indicated in ROS section below Review of Systems  Objective:  BP 134/78   Pulse 78   Temp 98.3 F (36.8 C) (Temporal)   Ht 5' 6.5" (1.689 m)   Wt 290 lb 8 oz (131.8 kg)   SpO2 97%   BMI 46.19 kg/m   Wt Readings from Last 3 Encounters:  11/20/21 290 lb 8 oz (131.8 kg)  08/19/21 285 lb (129.3 kg)  06/06/20 271 lb (122.9 kg)      Physical Exam Vitals and nursing note reviewed.  Constitutional:      Appearance: Normal appearance. He is obese. He is not ill-appearing.  HENT:     Mouth/Throat:     Mouth: Mucous membranes are moist.     Pharynx: Oropharynx is clear. No oropharyngeal exudate or posterior oropharyngeal erythema.  Cardiovascular:     Rate and Rhythm: Normal rate and regular rhythm.     Pulses: Normal pulses.     Heart sounds: Normal heart sounds. No murmur heard. Pulmonary:     Effort: Pulmonary effort is normal. No respiratory distress.     Breath sounds: Normal breath sounds. No wheezing, rhonchi or rales.  Abdominal:     General: Abdomen is protuberant. Bowel sounds are normal. There is distension.     Tenderness: There is no abdominal  tenderness. There is no guarding or rebound.     Hernia: A hernia is present. Hernia is present in the umbilical area.  Musculoskeletal:        General: Swelling present.     Right lower leg: Edema (1+) present.     Left lower leg: Edema (1+) present.  Skin:    General: Skin is warm and dry.     Findings: Erythema and rash present.     Comments: Wounds to R anterior shin with angry surrounding erythema without discharge/drainage  Neurological:     Mental Status: He is alert.  Psychiatric:        Mood and Affect: Mood normal.  Behavior: Behavior normal.    Wound care - wound irrigated with normal saline, dressed with triple abx ointment, covered with nonstick gauze and dressed with kerlex/coban wrap.       Results for orders placed or performed in visit on 11/20/21  Comprehensive metabolic panel  Result Value Ref Range   Sodium 138 135 - 145 mEq/L   Potassium 3.7 3.5 - 5.1 mEq/L   Chloride 97 96 - 112 mEq/L   CO2 32 19 - 32 mEq/L   Glucose, Bld 125 (H) 70 - 99 mg/dL   BUN 17 6 - 23 mg/dL   Creatinine, Ser 1.16 0.40 - 1.50 mg/dL   Total Bilirubin 0.7 0.2 - 1.2 mg/dL   Alkaline Phosphatase 99 39 - 117 U/L   AST 18 0 - 37 U/L   ALT 20 0 - 53 U/L   Total Protein 6.7 6.0 - 8.3 g/dL   Albumin 4.0 3.5 - 5.2 g/dL   GFR 63.78 >60.00 mL/min   Calcium 9.4 8.4 - 10.5 mg/dL  CBC with Differential/Platelet  Result Value Ref Range   WBC 6.4 4.0 - 10.5 K/uL   RBC 4.23 4.22 - 5.81 Mil/uL   Hemoglobin 13.2 13.0 - 17.0 g/dL   HCT 38.9 (L) 39.0 - 52.0 %   MCV 92.2 78.0 - 100.0 fl   MCHC 33.9 30.0 - 36.0 g/dL   RDW 13.5 11.5 - 15.5 %   Platelets 231.0 150.0 - 400.0 K/uL   Neutrophils Relative % 68.0 43.0 - 77.0 %   Lymphocytes Relative 16.3 12.0 - 46.0 %   Monocytes Relative 9.4 3.0 - 12.0 %   Eosinophils Relative 5.6 (H) 0.0 - 5.0 %   Basophils Relative 0.7 0.0 - 3.0 %   Neutro Abs 4.4 1.4 - 7.7 K/uL   Lymphs Abs 1.0 0.7 - 4.0 K/uL   Monocytes Absolute 0.6 0.1 - 1.0 K/uL    Eosinophils Absolute 0.4 0.0 - 0.7 K/uL   Basophils Absolute 0.0 0.0 - 0.1 K/uL  Brain natriuretic peptide  Result Value Ref Range   Pro B Natriuretic peptide (BNP) 18.0 0.0 - 100.0 pg/mL  POCT glycosylated hemoglobin (Hb A1C)  Result Value Ref Range   Hemoglobin A1C 7.0 (A) 4.0 - 5.6 %   HbA1c POC (<> result, manual entry)     HbA1c, POC (prediabetic range)     HbA1c, POC (controlled diabetic range)     Assessment & Plan:   Problem List Items Addressed This Visit     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 Pt cancelled 08/2021, then now showed appt 09/2021. Pt has still not rescheduled. Discussed this.       Type 2 diabetes mellitus with other specified complication (HCC)    Continues low dose farxiga and metformin $RemoveBefor'500mg'[id number]$  bid.  Was unable to do ozempic self injections.  A1c overall stable.  Generic glucometer sent to pharmacy as pt unsure of preferred insurance brand.  Provided fasting and postprandial glycemic goal ranges.       Relevant Orders   POCT glycosylated hemoglobin (Hb A1C) (Completed)   Comprehensive metabolic panel (Completed)   Anaerobic and Aerobic Culture   Obesity, morbid, BMI 68.1-15.7 ([place])   Umbilical hernia    Recent CT showed small bowel containing umbilical hernia.  Saw [facility] gen surgery - rec against surgery at this time, planned 6 mo f/u       Chronic right heart failure ([place])    Progressive weight gain noted.  Check BNP. Consider increased lasix dose pending lab results today.       Relevant Orders   Brain natriuretic peptide (Completed)   Adrenal adenoma, left    Incidental finding on recent CT 09/2021, rec f/u 1 year adrenal washout CT.       Cellulitis of right leg without foot - Primary    Recent fall with R shin abrasions complicated by cellulitis, vs reaction to neosporin topical abx. Wound cleaned and dressed today. Rx doxycycline 10d course. Red flags to return to clinic reviewed. Update if not improving with  treatment.  Check wound culture.       Relevant Orders   Comprehensive metabolic panel (Completed)   CBC with Differential/Platelet (Completed)   Anaerobic and Aerobic Culture   Cause of injury, fall    Fall going up stairs at home with R shin abrasions, now complicated by infection.       Transportation insecurity    Difficulty with transportation due to distance and cost - currently living with friend in [place]. This limits ability to come into clinic.         Meds ordered this encounter  Medications   doxycycline (VIBRA-TABS) 100 MG tablet    Sig: Take 1 tablet (100 mg total) by mouth 2 (two) times daily.    Dispense:  10 tablet    Refill:  0   DISCONTD: blood glucose meter kit and supplies KIT    Sig: Dispense based on patient and insurance preference. Use up to twice daily as directed. E11.4    Dispense:  1 each    Refill:  0    Order Specific Question:   Number of strips    Answer:   100    Order Specific Question:   Number of lancets    Answer:   100   blood glucose meter kit and supplies KIT    Sig: Dispense based on patient and insurance preference. Use up to twice daily as directed. E11.8    Dispense:  1 each    Refill:  0    Order Specific Question:   Number of strips    Answer:   100    Order Specific Question:   Number of lancets    Answer:   100   Orders Placed This Encounter  Procedures   Anaerobic and Aerobic Culture   Comprehensive metabolic panel   CBC with Differential/Platelet   Brain natriuretic peptide   POCT glycosylated hemoglobin (Hb A1C)     Patient Instructions  Labs today  I'll send in generic glucose meter so you can check sugars at home. Goal fasting sugar 80-120, goal sugar 2 hours after eating <180. Let us know if any sugar below 70 or staying >250.  For swelling - limit salt, increase water over coffee, elevate legs, increase lasix (furosemide) to $RemoveBeforeD'80mg'[id number]$  daily - new dose sent to pharmacy.  Schedule diabetic eye exam.  For R leg -  wash with sterile water or normal saline daily, do dressing changes daily, may use antibiotic ointment. Start doxycycline antibiotic sent to pharmacy.  If not improving, return to see me or schedule visit with my partners next week as I will be out of office.  Return in 3 months for diabetes check.   Follow up plan: Return in about 3 months (around 02/20/2022) for follow up visit.  [name], MD

## 2021-11-20 NOTE — Patient Instructions (Addendum)
Labs today  I'll send in generic glucose meter so you can check sugars at home. Goal fasting sugar 80-120, goal sugar 2 hours after eating <180. Let us know if any sugar below 70 or staying >250.  For swelling - limit salt, increase water over coffee, elevate legs, increase lasix (furosemide) to '80mg'$  daily - new dose sent to pharmacy.  Schedule diabetic eye exam.  For R leg - wash with sterile water or normal saline daily, do dressing changes daily, may use antibiotic ointment. Start doxycycline antibiotic sent to pharmacy.  If not improving, return to see me or schedule visit with my partners next week as I will be out of office.  Return in 3 months for diabetes check.

## 2021-11-21 ENCOUNTER — Telehealth: Payer: Self-pay

## 2021-11-21 DIAGNOSIS — Z5982 Transportation insecurity: Secondary | ICD-10-CM | POA: Insufficient documentation

## 2021-11-21 DIAGNOSIS — W19XXXA Unspecified fall, initial encounter: Secondary | ICD-10-CM | POA: Insufficient documentation

## 2021-11-21 NOTE — Telephone Encounter (Addendum)
Attempted to contact pt. No answer. No vm.  Need to relay results and Dr. Synthia Innocent message.   Labs/Dr. Synthia Innocent msg: Your kidneys, liver, heart pumping function and blood counts returned ok. We are awaiting wound culture results.  Increase Lasix to '80mg'$  (2- '40mg'$  tablets) in the morning for 3 days to see if increased urine output. Then return to '40mg'$  Lasix once a day.  I recommend you call for an OV if cellulitis is not improving on antibiotic prescribed.

## 2021-11-21 NOTE — Assessment & Plan Note (Addendum)
Recent fall with R shin abrasions complicated by cellulitis, vs reaction to neosporin topical abx. Wound cleaned and dressed today. Rx doxycycline 10d course. Red flags to return to clinic reviewed. Update if not improving with treatment.  Check wound culture.

## 2021-11-21 NOTE — Assessment & Plan Note (Signed)
Chest CTA ordered 08/2021. Pt cancelled 08/2021, then now showed appt 09/2021. Pt has still not rescheduled. Discussed this.

## 2021-11-21 NOTE — Assessment & Plan Note (Signed)
Incidental finding on recent CT 09/2021, rec f/u 1 year adrenal washout CT.

## 2021-11-21 NOTE — Assessment & Plan Note (Addendum)
Continues low dose farxiga and metformin '500mg'$  bid.  Was unable to do ozempic self injections.  A1c overall stable.  Generic glucometer sent to pharmacy as pt unsure of preferred insurance brand.  Provided fasting and postprandial glycemic goal ranges.

## 2021-11-21 NOTE — Assessment & Plan Note (Signed)
Progressive weight gain noted.  Check BNP. Consider increased lasix dose pending lab results today.

## 2021-11-21 NOTE — Assessment & Plan Note (Addendum)
Fall going up stairs at home with R shin abrasions, now complicated by infection.

## 2021-11-21 NOTE — Assessment & Plan Note (Addendum)
Recent CT showed small bowel containing umbilical hernia.  Saw [facility] gen surgery - rec against surgery at this time, planned 6 mo f/u

## 2021-11-21 NOTE — Assessment & Plan Note (Signed)
Difficulty with transportation due to distance and cost - currently living with friend in [place]. This limits ability to come into clinic.

## 2021-11-22 MED ORDER — ACCU-CHEK SOFTCLIX LANCETS MISC: 3 refills | Status: DC

## 2021-11-22 MED ORDER — ACCU-CHEK GUIDE ME W/DEVICE KIT: PACK | 0 refills | Status: DC

## 2021-11-22 NOTE — Telephone Encounter (Signed)
Attempted to contact pt. No answer. No vm.  Need to relay results and Dr. Synthia Innocent message.   Labs/Dr. Synthia Innocent msg: Your kidneys, liver, heart pumping function and blood counts returned ok. We are awaiting wound culture results.  Increase Lasix to '80mg'$  (2- '40mg'$  tablets) in the morning for 3 days to see if increased urine output. Then return to '40mg'$  Lasix once a day.  I recommend you call for an OV if cellulitis is not improving on antibiotic prescribed.

## 2021-11-22 NOTE — Telephone Encounter (Signed)
Tried to call patient on home phone and mobile. Unable to leave a message because no answering machine or voicemail. Will have to try and reach patient again later.

## 2021-11-22 NOTE — Telephone Encounter (Signed)
E-scribed rx for Acc-Chek Guide Me meter, Accu-Chek Guide test strips and Accu-Chek Softclix lancets.

## 2021-11-24 ENCOUNTER — Other Ambulatory Visit: Payer: Self-pay | Admitting: Family Medicine

## 2021-11-24 DIAGNOSIS — L03115 Cellulitis of right lower limb: Secondary | ICD-10-CM

## 2021-11-25 NOTE — Telephone Encounter (Addendum)
Attempted to contact pt. No answer. No vm.  Need to relay results and Dr. Synthia Innocent message.   Labs/Dr. Synthia Innocent msg: Your kidneys, liver, heart pumping function and blood counts returned ok. We are awaiting wound culture results.  Increase Lasix to '80mg'$  (2- '40mg'$  tablets) in the morning for 3 days to see if increased urine output. Then return to '40mg'$  Lasix once a day.   The wound culture grew streptococcal infection.  I recommend you call for an OV if cellulitis is not improving on antibiotic prescribed.

## 2021-11-26 LAB — ANAEROBIC AND AEROBIC CULTURE
MICRO NUMBER:: 13524883
MICRO NUMBER:: 13524884
SPECIMEN QUALITY:: ADEQUATE
SPECIMEN QUALITY:: ADEQUATE

## 2021-11-26 NOTE — Telephone Encounter (Addendum)
Attempted to contact pt. No answer. No vm.  Need to relay results and Dr. Synthia Innocent message. Attempted several times to contact pt with no response. Mailing a letter.   Labs/Dr. Synthia Innocent msg: Your kidneys, liver, heart pumping function and blood counts returned ok.   Increase Lasix to '80mg'$  (2- '40mg'$  tablets) in the morning for 3 days to see if increased urine output. Then return to '40mg'$  Lasix once a day.   The wound culture grew streptococcal infection.  I recommend you call for an OV if cellulitis is not improving on antibiotic prescribed.

## 2021-12-07 ENCOUNTER — Other Ambulatory Visit: Payer: Self-pay | Admitting: Family Medicine

## 2021-12-09 NOTE — Telephone Encounter (Signed)
Name of Medication: Lorazepam Name of Pharmacy: [facility] or Written Date and Quantity: 08/13/21, #40 Last Office Visit and Type: 11/20/21, DM f/u; cellulitis Next Office Visit and Type: 02/21/22, 3 mo DM f/u Last Controlled Substance Agreement Date: none Last UDS: none

## 2021-12-11 NOTE — Telephone Encounter (Signed)
ERx 

## 2022-02-21 ENCOUNTER — Ambulatory Visit: Payer: Medicare Other | Admitting: Family Medicine

## 2022-02-28 ENCOUNTER — Ambulatory Visit (INDEPENDENT_AMBULATORY_CARE_PROVIDER_SITE_OTHER): Payer: Medicare Other | Admitting: Family Medicine

## 2022-02-28 ENCOUNTER — Encounter: Payer: Self-pay | Admitting: Family Medicine

## 2022-02-28 VITALS — BP 152/86 | HR 85 | Temp 97.3°F | Ht 66.5 in | Wt 291.0 lb

## 2022-02-28 DIAGNOSIS — Z23 Encounter for immunization: Secondary | ICD-10-CM

## 2022-02-28 DIAGNOSIS — E1169 Type 2 diabetes mellitus with other specified complication: Secondary | ICD-10-CM

## 2022-02-28 DIAGNOSIS — I10 Essential (primary) hypertension: Secondary | ICD-10-CM

## 2022-02-28 DIAGNOSIS — I50812 Chronic right heart failure: Secondary | ICD-10-CM

## 2022-02-28 DIAGNOSIS — R0602 Shortness of breath: Secondary | ICD-10-CM

## 2022-02-28 DIAGNOSIS — Z5982 Transportation insecurity: Secondary | ICD-10-CM | POA: Diagnosis not present

## 2022-02-28 DIAGNOSIS — L03115 Cellulitis of right lower limb: Secondary | ICD-10-CM | POA: Diagnosis not present

## 2022-02-28 LAB — POCT GLYCOSYLATED HEMOGLOBIN (HGB A1C): Hemoglobin A1C: 7.6 % — AB (ref 4.0–5.6)

## 2022-02-28 MED ORDER — FUROSEMIDE 80 MG PO TABS: 80.0000 mg | ORAL_TABLET | Freq: Every day | ORAL | 1 refills | Status: DC

## 2022-02-28 MED ORDER — METOLAZONE 2.5 MG PO TABS: 2.5000 mg | ORAL_TABLET | ORAL | 1 refills | Status: DC

## 2022-02-28 MED ORDER — CEPHALEXIN 500 MG PO CAPS: 500.0000 mg | ORAL_CAPSULE | Freq: Four times a day (QID) | ORAL | 0 refills | Status: DC

## 2022-02-28 MED ORDER — VALSARTAN 320 MG PO TABS: 320.0000 mg | ORAL_TABLET | Freq: Every day | ORAL | 1 refills | Status: DC

## 2022-02-28 NOTE — Progress Notes (Unsigned)
Patient ID: Gregory Fisher, male    DOB: [birth year], 71 y.o.   MRN: [id number]  This visit was conducted in person.  BP (!) 152/86 (BP Location: Right Arm, Cuff Size: Large)   Pulse 85   Temp (!) 97.3 F (36.3 C) (Temporal)   Ht 5' 6.5" (1.689 m)   Wt 291 lb (132 kg)   SpO2 97%   BMI 46.26 kg/m    CC: 3 mo DM f/u visit  Subjective:   HPI: Gregory Fisher is a 71 y.o. male presenting on 02/28/2022 for Diabetes (Here for 3 mo f/u.)   Worsening dyspnea associated with bilateral R>L leg swelling with redness R>L and weeping, burning. Treating burning pain with ibuprofen. Wound culture grew Strep dysgalactiae, heavy growth. Last visit treated with doxycycline course - didn't note much improvement with this. No fever or nausea.   HTN - Compliant with current antihypertensive regimen of carvedilol 12.5mg  bid, clonidine 0.1mg  bid, lasix 40mg  daily, valsartan hctz 320/12.5mg  daily. Does not check blood pressures at home. No low blood pressure readings or symptoms of dizziness/syncope. Denies HA, vision changes, CP/tightness. ++ shortness of breath and leg swelling.    DM - does not regularly check sugars. Compliant with antihyperglycemic regimen which includes: metformin 500mg  bid. Ran out of farxiga. Having difficulty affording medication. Will refer to pharmacy. Denies low sugars or hypoglycemic symptoms. + left foot paresthesias, denies blurry vision. Last diabetic eye exam 08/2018 - DUE. Glucometer brand: accuchek. Last foot exam: 11/2021. DSME: declined due to cost concerns. Lab Results  Component Value Date   HGBA1C 7.6 (A) 02/28/2022   Diabetic Foot Exam - Simple   No data filed    Lab Results  Component Value Date   MICROALBUR <0.7 08/19/2021         Relevant past medical, surgical, family and social history reviewed and updated as indicated. Interim medical history since our last visit reviewed. Allergies and medications reviewed and updated. Outpatient Medications Prior  to Visit  Medication Sig Dispense Refill   Accu-Chek Softclix Lancets lancets Use as instructed to check blood sugar 2 (times) a day 200 each 3   aspirin EC 81 MG tablet Take 81 mg by mouth daily.      atorvastatin (LIPITOR) 80 MG tablet Take 1 tablet (80 mg total) by mouth daily. 90 tablet 3   Blood Glucose Monitoring Suppl (ACCU-CHEK GUIDE ME) w/Device KIT Use as instructed to check blood sugar 2 (times) a day 1 kit 0   carvedilol (COREG) 12.5 MG tablet Take 1 tablet (12.5 mg total) by mouth 2 (two) times daily with a meal. 180 tablet 3   Cholecalciferol (VITAMIN D3) 25 MCG (1000 UT) CAPS Take 2 capsules (2,000 Units total) by mouth daily. 30 capsule    cloNIDine (CATAPRES) 0.1 MG tablet Take 1 tablet (0.1 mg total) by mouth 2 (two) times daily. 180 tablet 3   cyanocobalamin 1000 MCG tablet Take 1,000 mcg by mouth daily.     glucose blood (ACCU-CHEK GUIDE) test strip Use as instructed to check blood sugar 2 (times) a day 200 strip 3   LORazepam (ATIVAN) 1 MG tablet TAKE 1 TABLET BY MOUTH DAILY AS NEEDED FOR ANXIETY 40 tablet 0   magnesium hydroxide (MILK OF MAGNESIA) 400 MG/5ML suspension Take 5 mL by mouth daily as needed for constipation.     metFORMIN (GLUCOPHAGE) 500 MG tablet Take 1 tablet (500 mg total) by mouth 2 (two) times daily with a meal. 180 tablet  3   Naproxen Sodium (ALEVE PO) Take 1 tablet by mouth daily as needed.     tamsulosin (FLOMAX) 0.4 MG CAPS capsule Take 2 capsules (0.8 mg total) by mouth at bedtime. 180 capsule 3   vitamin C (ASCORBIC ACID) 500 MG tablet Take 500 mg by mouth daily.     doxycycline (VIBRA-TABS) 100 MG tablet Take 1 tablet (100 mg total) by mouth 2 (two) times daily. 10 tablet 0   furosemide (LASIX) 40 MG tablet Take 1 tablet (40 mg total) by mouth daily. 90 tablet 3   valsartan-hydrochlorothiazide (DIOVAN-HCT) 320-12.5 MG tablet Take 1 tablet by mouth daily. 90 tablet 3   dapagliflozin propanediol (FARXIGA) 5 MG TABS tablet Take 1 tablet (5 mg total) by  mouth daily before breakfast. (Patient not taking: Reported on 02/28/2022) 30 tablet 11   No facility-administered medications prior to visit.     Per HPI unless specifically indicated in ROS section below Review of Systems  Objective:  BP (!) 152/86 (BP Location: Right Arm, Cuff Size: Large)   Pulse 85   Temp (!) 97.3 F (36.3 C) (Temporal)   Ht 5' 6.5" (1.689 m)   Wt 291 lb (132 kg)   SpO2 97%   BMI 46.26 kg/m   Wt Readings from Last 3 Encounters:  02/28/22 291 lb (132 kg)  11/20/21 290 lb 8 oz (131.8 kg)  08/19/21 285 lb (129.3 kg)      Physical Exam Vitals and nursing note reviewed.  Constitutional:      Appearance: Normal appearance. He is obese.  HENT:     Mouth/Throat:     Mouth: Mucous membranes are moist.     Pharynx: Oropharynx is clear. No oropharyngeal exudate or posterior oropharyngeal erythema.  Eyes:     Extraocular Movements: Extraocular movements intact.     Pupils: Pupils are equal, round, and reactive to light.  Cardiovascular:     Rate and Rhythm: Normal rate and regular rhythm.     Pulses: Normal pulses.     Heart sounds: Normal heart sounds. No murmur heard. Pulmonary:     Effort: Pulmonary effort is normal. No respiratory distress.     Breath sounds: No wheezing, rhonchi or rales.  Musculoskeletal:        General: Swelling and tenderness present.     Right lower leg: Edema present.     Left lower leg: Edema present.     Comments:  Diminished pulses bilaterally Macerated skin R>L lower extremities with weeping serous fluid present  Skin:    Findings: Erythema and rash present.  Neurological:     Mental Status: He is alert.  Psychiatric:        Mood and Affect: Mood normal.        Behavior: Behavior normal.   Left leg:   Right leg:      Results for orders placed or performed in visit on 02/28/22  POCT glycosylated hemoglobin (Hb A1C)  Result Value Ref Range   Hemoglobin A1C 7.6 (A) 4.0 - 5.6 %   HbA1c POC (<> result, manual  entry)     HbA1c, POC (prediabetic range)     HbA1c, POC (controlled diabetic range)     Lab Results  Component Value Date   CREATININE 1.16 11/20/2021   BUN 17 11/20/2021   NA 138 11/20/2021   K 3.7 11/20/2021   CL 97 11/20/2021   CO2 32 11/20/2021    Lab Results  Component Value Date   WBC 6.4 11/20/2021  HGB 13.2 11/20/2021   HCT 38.9 (L) 11/20/2021   MCV 92.2 11/20/2021   PLT 231.0 11/20/2021     Assessment & Plan:   Problem List Items Addressed This Visit     HTN (hypertension)    Chronic, mildly elevated today - will start metolazone 2.5mg  twice weekly, and stop HCTZ, continue plain valsartan. Other changes as per below.       Relevant Medications   valsartan (DIOVAN) 320 MG tablet   furosemide (LASIX) 80 MG tablet   metolazone (ZAROXOLYN) 2.5 MG tablet (Start on 03/03/2022)   Other Relevant Orders   AMB Referral to Chronic Care Management Services   Type 2 diabetes mellitus with other specified complication ([place]) - Primary    Continues metformin 500mg  bid.  Unable to self-inject ozempic.  farxiga started earlier this year however struggling with cost of medication - he ran out and didn't refill. Will refer to care management/pharmacy for assistance.  Diabetes complicated by HTN, heart failure, obesity, mood disorder.  Advised he call and schedule diabetic eye exam as due.       Relevant Medications   valsartan (DIOVAN) 320 MG tablet   Other Relevant Orders   POCT glycosylated hemoglobin (Hb A1C) (Completed)   AMB Referral to Chronic Care Management Services   Obesity, morbid, BMI 40.0-49.9 ([place])    Morbid obesity associated with diabetes, HTN, HLD, cellulitis, pedal edema, CHF.       Chronic right heart failure ([place])    Previous echo 05/2020 with evidence of R heart failure (global RV systolic function low), but left heart function was normal.  Weight gain, pedal edema, shortness of breath, not improved with recent increase in lasix to 80mg  - will add  metolazone 2.5mg  twice weekly, update with effect.  Recent BNP normal.  Consider updated labs and echo when seen in f/u next week.       Relevant Medications   valsartan (DIOVAN) 320 MG tablet   furosemide (LASIX) 80 MG tablet   metolazone (ZAROXOLYN) 2.5 MG tablet (Start on 03/03/2022)   Other Relevant Orders   AMB Referral to Chronic Care Management Services   Cellulitis of right leg without foot    Wound culture 11/2021 grew strep dysgalactiae. Doxycycline may have helped heal sores/ulcer but redness persists. Will Rx keflex course, place legs in ace wraps, rtc next week for close f/u.  No systemic symptoms of fever/nausea.       Transportation insecurity    Financial difficulties, transportation limitations - will refer to CCM/case management.       Relevant Orders   AMB Referral to Chronic Care Management Services   Dyspnea    Dyspnea anticipate multifactorial including obesity, R heart failure. Will need to evaluate for OSA, consider cardiology referral and updated echocardiogram. Non smoker, no known h/o COPD.       Other Visit Diagnoses     Need for influenza vaccination       Relevant Orders   Flu Vaccine QUAD High Dose(Fluad) (Completed)        Meds ordered this encounter  Medications   valsartan (DIOVAN) 320 MG tablet    Sig: Take 1 tablet (320 mg total) by mouth daily.    Dispense:  90 tablet    Refill:  1    To replace valsartan hctz   furosemide (LASIX) 80 MG tablet    Sig: Take 1 tablet (80 mg total) by mouth daily.    Dispense:  90 tablet    Refill:  1    Note new dose   metolazone (ZAROXOLYN) 2.5 MG tablet    Sig: Take 1 tablet (2.5 mg total) by mouth 2 (two) times a week.    Dispense:  30 tablet    Refill:  1   cephALEXin (KEFLEX) 500 MG capsule    Sig: Take 1 capsule (500 mg total) by mouth 4 (four) times daily.    Dispense:  28 capsule    Refill:  0   Orders Placed This Encounter  Procedures   Flu Vaccine QUAD High Dose(Fluad)   AMB  Referral to Chronic Care Management Services    Referral Priority:   Routine    Referral Type:   Consultation    Referral Reason:   Chronic Care Management (CCM)    Number of Visits Requested:   1   POCT glycosylated hemoglobin (Hb A1C)     Patient Instructions  Flu shot today  Increase lasix to $Remove'80mg'SdQGgYr$  daily.  Stop valsartan hydrochlorothiazide combo pill, start plain valsartan $RemoveBeforeD'320mg'ZIjsGUxUsvgUsc$  daily. Start twice weekly metolazone (zaroxolyn) stronger water pill in addition to lasix $Remove'80mg'feVtoiu$  daily.  Start new antibiotic keflex $RemoveBeforeDEI'500mg'GxXtATNCUVyNdmLk$  4 times a day for 1 week. Schedule diabetic eye exam as you're due.  Return to see me Wednesday morning.   Follow up plan: Return if symptoms worsen or fail to improve.  [name], MD

## 2022-02-28 NOTE — Patient Instructions (Addendum)
Flu shot today  Increase lasix to '80mg'$  daily.  Stop valsartan hydrochlorothiazide combo pill, start plain valsartan '320mg'$  daily. Start twice weekly metolazone (zaroxolyn) stronger water pill in addition to lasix '80mg'$  daily.  Start new antibiotic keflex '500mg'$  4 times a day for 1 week. Schedule diabetic eye exam as you're due.  Return to see me Wednesday morning.

## 2022-03-01 ENCOUNTER — Other Ambulatory Visit: Payer: Self-pay | Admitting: Family Medicine

## 2022-03-01 DIAGNOSIS — R0600 Dyspnea, unspecified: Secondary | ICD-10-CM | POA: Insufficient documentation

## 2022-03-01 NOTE — Assessment & Plan Note (Addendum)
Wound culture 11/2021 grew strep dysgalactiae. Doxycycline may have helped heal sores/ulcer but redness persists. Will Rx keflex course, place legs in ace wraps, rtc next week for close f/u.  No systemic symptoms of fever/nausea.

## 2022-03-01 NOTE — Assessment & Plan Note (Addendum)
Dyspnea anticipate multifactorial including obesity, R heart failure. Will need to evaluate for OSA, consider cardiology referral and updated echocardiogram. Non smoker, no known h/o COPD.

## 2022-03-01 NOTE — Assessment & Plan Note (Signed)
Morbid obesity associated with diabetes, HTN, HLD, cellulitis, pedal edema, CHF.

## 2022-03-01 NOTE — Assessment & Plan Note (Addendum)
Chronic, mildly elevated today - will start metolazone 2.'5mg'$  twice weekly, and stop HCTZ, continue plain valsartan. Other changes as per below.

## 2022-03-01 NOTE — Assessment & Plan Note (Signed)
Financial difficulties, transportation limitations - will refer to CCM/case management.

## 2022-03-01 NOTE — Assessment & Plan Note (Addendum)
Previous echo 05/2020 with evidence of R heart failure (global RV systolic function low), but left heart function was normal.  Weight gain, pedal edema, shortness of breath, not improved with recent increase in lasix to '80mg'$  - will add metolazone 2.'5mg'$  twice weekly, update with effect.  Recent BNP normal.  Consider updated labs and echo when seen in f/u next week.

## 2022-03-01 NOTE — Assessment & Plan Note (Addendum)
Continues metformin '500mg'$  bid.  Unable to self-inject ozempic.  farxiga started earlier this year however struggling with cost of medication - he ran out and didn't refill. Will refer to care management/pharmacy for assistance.  Diabetes complicated by HTN, heart failure, obesity, mood disorder.  Advised he call and schedule diabetic eye exam as due.

## 2022-03-03 NOTE — Telephone Encounter (Signed)
Refill request Lorazepam Last office visit 02/28/22 Last refill 12/11/21 #40

## 2022-03-04 NOTE — Telephone Encounter (Signed)
ERx 

## 2022-03-05 ENCOUNTER — Ambulatory Visit (INDEPENDENT_AMBULATORY_CARE_PROVIDER_SITE_OTHER): Payer: Medicare Other | Admitting: Family Medicine

## 2022-03-05 ENCOUNTER — Encounter: Payer: Self-pay | Admitting: Family Medicine

## 2022-03-05 VITALS — BP 120/66 | HR 102 | Temp 97.4°F | Ht 66.5 in | Wt 279.1 lb

## 2022-03-05 DIAGNOSIS — I50812 Chronic right heart failure: Secondary | ICD-10-CM | POA: Diagnosis not present

## 2022-03-05 DIAGNOSIS — E1169 Type 2 diabetes mellitus with other specified complication: Secondary | ICD-10-CM

## 2022-03-05 DIAGNOSIS — L03115 Cellulitis of right lower limb: Secondary | ICD-10-CM

## 2022-03-05 DIAGNOSIS — Z5982 Transportation insecurity: Secondary | ICD-10-CM | POA: Diagnosis not present

## 2022-03-05 DIAGNOSIS — I87333 Chronic venous hypertension (idiopathic) with ulcer and inflammation of bilateral lower extremity: Secondary | ICD-10-CM | POA: Diagnosis not present

## 2022-03-05 DIAGNOSIS — I10 Essential (primary) hypertension: Secondary | ICD-10-CM | POA: Diagnosis not present

## 2022-03-05 LAB — COMPREHENSIVE METABOLIC PANEL
ALT: 16 U/L (ref 0–53)
AST: 15 U/L (ref 0–37)
Albumin: 3.7 g/dL (ref 3.5–5.2)
Alkaline Phosphatase: 82 U/L (ref 39–117)
BUN: 26 mg/dL — ABNORMAL HIGH (ref 6–23)
CO2: 34 mEq/L — ABNORMAL HIGH (ref 19–32)
Calcium: 9.6 mg/dL (ref 8.4–10.5)
Chloride: 92 mEq/L — ABNORMAL LOW (ref 96–112)
Creatinine, Ser: 1.79 mg/dL — ABNORMAL HIGH (ref 0.40–1.50)
GFR: 37.82 mL/min — ABNORMAL LOW (ref >60.00)
Glucose, Bld: 244 mg/dL — ABNORMAL HIGH (ref 70–99)
Potassium: 3.3 mEq/L — ABNORMAL LOW (ref 3.5–5.1)
Sodium: 134 mEq/L — ABNORMAL LOW (ref 135–145)
Total Bilirubin: 0.7 mg/dL (ref 0.2–1.2)
Total Protein: 7.6 g/dL (ref 6.0–8.3)

## 2022-03-05 LAB — CBC WITH DIFFERENTIAL/PLATELET
Basophils Absolute: 0.1 10*3/uL (ref 0.0–0.1)
Basophils Relative: 0.5 % (ref 0.0–3.0)
Eosinophils Absolute: 0.2 10*3/uL (ref 0.0–0.7)
Eosinophils Relative: 1.9 % (ref 0.0–5.0)
HCT: 37.7 % — ABNORMAL LOW (ref 39.0–52.0)
Hemoglobin: 13 g/dL (ref 13.0–17.0)
Lymphocytes Relative: 8.2 % — ABNORMAL LOW (ref 12.0–46.0)
Lymphs Abs: 0.9 10*3/uL (ref 0.7–4.0)
MCHC: 34.4 g/dL (ref 30.0–36.0)
MCV: 89.9 fl (ref 78.0–100.0)
Monocytes Absolute: 0.9 10*3/uL (ref 0.1–1.0)
Monocytes Relative: 8.7 % (ref 3.0–12.0)
Neutro Abs: 8.7 10*3/uL — ABNORMAL HIGH (ref 1.4–7.7)
Neutrophils Relative %: 80.7 % — ABNORMAL HIGH (ref 43.0–77.0)
Platelets: 312 10*3/uL (ref 150.0–400.0)
RBC: 4.19 Mil/uL — ABNORMAL LOW (ref 4.22–5.81)
RDW: 13.3 % (ref 11.5–15.5)
WBC: 10.8 10*3/uL — ABNORMAL HIGH (ref 4.0–10.5)

## 2022-03-05 MED ORDER — GABAPENTIN 300 MG PO CAPS: 300.0000 mg | ORAL_CAPSULE | Freq: Two times a day (BID) | ORAL | 1 refills | Status: DC | PRN

## 2022-03-05 NOTE — Progress Notes (Unsigned)
Patient ID: Gregory Fisher, male    DOB: 08/14/1950, 71 y.o.   MRN: [id number]  This visit was conducted in person.  BP 120/66   Pulse (!) 102   Temp (!) 97.4 F (36.3 C) (Temporal)   Ht 5' 6.5" (1.689 m)   Wt 279 lb 2 oz (126.6 kg)   SpO2 94%   BMI 44.38 kg/m    CC: leg wound follow up Subjective:   HPI: Gregory Fisher is a 71 y.o. male presenting on 03/05/2022 for Wound Check   See prior note for details.  Last visit we increased furosemide to 80mg  daily, added metolazone 2.5mg  twice weekly, and started keflex course for BLE cellulitis. He's had resultant 12 lb weight loss in the past week. Valsartan/hctz changed to plain valsartan.   BP better controlled, denies any dizziness or lightheadedness.   Main concern is ongoing burning pain to right lower leg. Tylenol hasn't helped.   Lab Results  Component Value Date   HGBA1C 7.6 (A) 02/28/2022   Referred to CCM for medication assistance [facility]) - this is pending.      Relevant past medical, surgical, family and social history reviewed and updated as indicated. Interim medical history since our last visit reviewed. Allergies and medications reviewed and updated. Outpatient Medications Prior to Visit  Medication Sig Dispense Refill   Accu-Chek Softclix Lancets lancets Use as instructed to check blood sugar 2 (times) a day 200 each 3   aspirin EC 81 MG tablet Take 81 mg by mouth daily.      atorvastatin (LIPITOR) 80 MG tablet Take 1 tablet (80 mg total) by mouth daily. 90 tablet 3   Blood Glucose Monitoring Suppl (ACCU-CHEK GUIDE ME) w/Device KIT Use as instructed to check blood sugar 2 (times) a day 1 kit 0   carvedilol (COREG) 12.5 MG tablet Take 1 tablet (12.5 mg total) by mouth 2 (two) times daily with a meal. 180 tablet 3   cephALEXin (KEFLEX) 500 MG capsule Take 1 capsule (500 mg total) by mouth 4 (four) times daily. 28 capsule 0   Cholecalciferol (VITAMIN D3) 25 MCG (1000 UT) CAPS Take 2 capsules (2,000 Units  total) by mouth daily. 30 capsule    cloNIDine (CATAPRES) 0.1 MG tablet Take 1 tablet (0.1 mg total) by mouth 2 (two) times daily. 180 tablet 3   cyanocobalamin 1000 MCG tablet Take 1,000 mcg by mouth daily.     dapagliflozin propanediol (FARXIGA) 5 MG TABS tablet Take 1 tablet (5 mg total) by mouth daily before breakfast. 30 tablet 11   furosemide (LASIX) 80 MG tablet Take 1 tablet (80 mg total) by mouth daily. 90 tablet 1   glucose blood (ACCU-CHEK GUIDE) test strip Use as instructed to check blood sugar 2 (times) a day 200 strip 3   LORazepam (ATIVAN) 1 MG tablet TAKE 1 TABLET BY MOUTH EVERY DAY AS NEEDED FOR ANXIETY 40 tablet 0   magnesium hydroxide (MILK OF MAGNESIA) 400 MG/5ML suspension Take 5 mL by mouth daily as needed for constipation.     metFORMIN (GLUCOPHAGE) 500 MG tablet Take 1 tablet (500 mg total) by mouth 2 (two) times daily with a meal. 180 tablet 3   metolazone (ZAROXOLYN) 2.5 MG tablet Take 1 tablet (2.5 mg total) by mouth 2 (two) times a week. 30 tablet 1   Naproxen Sodium (ALEVE PO) Take 1 tablet by mouth daily as needed.     tamsulosin (FLOMAX) 0.4 MG CAPS capsule Take 2 capsules (  0.8 mg total) by mouth at bedtime. 180 capsule 3   valsartan (DIOVAN) 320 MG tablet Take 1 tablet (320 mg total) by mouth daily. 90 tablet 1   vitamin C (ASCORBIC ACID) 500 MG tablet Take 500 mg by mouth daily.     No facility-administered medications prior to visit.     Per HPI unless specifically indicated in ROS section below Review of Systems  Objective:  BP 120/66   Pulse (!) 102   Temp (!) 97.4 F (36.3 C) (Temporal)   Ht 5' 6.5" (1.689 m)   Wt 279 lb 2 oz (126.6 kg)   SpO2 94%   BMI 44.38 kg/m   Wt Readings from Last 3 Encounters:  03/05/22 279 lb 2 oz (126.6 kg)  02/28/22 291 lb (132 kg)  11/20/21 290 lb 8 oz (131.8 kg)      Physical Exam Vitals and nursing note reviewed.  Constitutional:      Appearance: Normal appearance. He is not ill-appearing.  Cardiovascular:      Rate and Rhythm: Normal rate and regular rhythm.     Pulses: Normal pulses.     Heart sounds: Normal heart sounds. No murmur heard. Pulmonary:     Effort: Pulmonary effort is normal. No respiratory distress.     Breath sounds: Normal breath sounds. No wheezing, rhonchi or rales.  Musculoskeletal:        General: Tenderness present.     Right lower leg: Edema present.     Left lower leg: Edema present.     Comments: 1+ DP bilaterally  Skin:    General: Skin is warm.     Findings: Erythema and rash present.          Comments:  Improving ulcers to left anterior leg Marked maceration of right anterior leg with erythema and weeping  Neurological:     Mental Status: He is alert.    RLE:   LLE:      Results for orders placed or performed in visit on 03/05/22  WOUND CULTURE   Specimen: Blood  Result Value Ref Range   MICRO NUMBER: [id number]    SPECIMEN QUALITY: Adequate    SOURCE: NOT GIVEN    STATUS: PRELIMINARY    GRAM STAIN:      No white blood cells seen Rare epithelial cells Many Gram negative bacilli Few Gram positive cocci in clusters  CBC with Differential/Platelet  Result Value Ref Range   WBC 10.8 (H) 4.0 - 10.5 K/uL   RBC 4.19 (L) 4.22 - 5.81 Mil/uL   Hemoglobin 13.0 13.0 - 17.0 g/dL   HCT 37.7 (L) 39.0 - 52.0 %   MCV 89.9 78.0 - 100.0 fl   MCHC 34.4 30.0 - 36.0 g/dL   RDW 13.3 11.5 - 15.5 %   Platelets 312.0 150.0 - 400.0 K/uL   Neutrophils Relative % 80.7 (H) 43.0 - 77.0 %   Lymphocytes Relative 8.2 (L) 12.0 - 46.0 %   Monocytes Relative 8.7 3.0 - 12.0 %   Eosinophils Relative 1.9 0.0 - 5.0 %   Basophils Relative 0.5 0.0 - 3.0 %   Neutro Abs 8.7 (H) 1.4 - 7.7 K/uL   Lymphs Abs 0.9 0.7 - 4.0 K/uL   Monocytes Absolute 0.9 0.1 - 1.0 K/uL   Eosinophils Absolute 0.2 0.0 - 0.7 K/uL   Basophils Absolute 0.1 0.0 - 0.1 K/uL  Comprehensive metabolic panel  Result Value Ref Range   Sodium 134 (L) 135 - 145 mEq/L   Potassium  3.3 (L) 3.5 - 5.1 mEq/L   Chloride  92 (L) 96 - 112 mEq/L   CO2 34 (H) 19 - 32 mEq/L   Glucose, Bld 244 (H) 70 - 99 mg/dL   BUN 26 (H) 6 - 23 mg/dL   Creatinine, Ser 1.79 (H) 0.40 - 1.50 mg/dL   Total Bilirubin 0.7 0.2 - 1.2 mg/dL   Alkaline Phosphatase 82 39 - 117 U/L   AST 15 0 - 37 U/L   ALT 16 0 - 53 U/L   Total Protein 7.6 6.0 - 8.3 g/dL   Albumin 3.7 3.5 - 5.2 g/dL   GFR 37.82 (L) >60.00 mL/min   Calcium 9.6 8.4 - 10.5 mg/dL    Assessment & Plan:   Problem List Items Addressed This Visit     HTN (hypertension)    Marked improvement in blood pressure with med changes made last week. Update labs.       Type 2 diabetes mellitus with other specified complication ([place])    Continues only metformin $RemoveBefore'500mg'IoYyZHfeDgkUV$  BID. Trouble affording farxiga - pending CCM referral placed last week.       Relevant Orders   Ambulatory referral to [place] referral to wound care center   Obesity, morbid, BMI 40.0-49.9 ([place])   Chronic right heart failure ([place])    Last echo 05/2020.  With worsening pedal edema, will update echocardiogram - try to get done at [facility] as he lives near [place].       Relevant Orders   Ambulatory referral to [place]   AMB referral to wound care center   ECHOCARDIOGRAM COMPLETE   Cellulitis of right leg without foot    Treated with doxycycline course 11/2021 - wound culture at that time grew streptococcal dysgalactiae (Equisimilis). Worsened on exam 02/2022 - placed on keflex QID course.  Check wound culture today, finish antibiotic course.  Will wrap with ace wrap.  Will try to get plugged in with home health given he's unable to change dressings at home and will refer him to [facility] wound healing clinic in [place] per pt location preference.  I asked him to return in 2 wks, sooner if needed.       Relevant Orders   WOUND CULTURE (Completed)   Ambulatory referral to [place] referral to wound care center   Transportation insecurity    Pending CCM consult       Chronic  venous hypertension (idiopathic) with ulcer and inflammation of bilateral lower extremity ([facility]) - Primary    Chronic, now with inflammation and maceration/ulceration of entire R anterior leg.  Pending workup and wound clinic referral. Consider evaluation by vascular surgery.  For endorsed burning pain, will Rx gabapentin $RemoveBefor'300mg'yxuNCyzMvZuy$  nightly with option to increase to BID if needed.       Relevant Orders   CBC with Differential/Platelet (Completed)   Comprehensive metabolic panel (Completed)   Ambulatory referral to [place] referral to wound care center     Meds ordered this encounter  Medications   gabapentin (NEURONTIN) 300 MG capsule    Sig: Take 1 capsule (300 mg total) by mouth 2 (two) times daily as needed (leg pain).    Dispense:  60 capsule    Refill:  1   Orders Placed This Encounter  Procedures   WOUND CULTURE    Order Specific Question:   Source    Answer:   right anterior leg   CBC with Differential/Platelet  Comprehensive metabolic panel   Ambulatory referral to Home Health    Referral Priority:   Urgent    Referral Type:   Home Health Care    Referral Reason:   Specialty Services Required    Requested Specialty:   [place]    Number of Visits Requested:   1   AMB referral to wound care center    Referral Priority:   Urgent    Referral Type:   Consultation    Number of Visits Requested:   1   ECHOCARDIOGRAM COMPLETE    Standing Status:   Future    Standing Expiration Date:   03/07/2023    Order Specific Question:   Where should this test be performed    Answer:   [facility]    Order Specific Question:   Perflutren DEFINITY (image enhancing agent) should be administered unless hypersensitivity or allergy exist    Answer:   Administer Perflutren    Order Specific Question:   Is a special reader required? (athlete or structural heart)    Answer:   No    Order Specific Question:   Does this study need to be read by the Structural team/Level 3  readers?    Answer:   No    Order Specific Question:   Reason for exam-Echo    Answer:   Congestive Heart Failure  I50.9     Patient Instructions  Check labs today We will refer you to wound center in [place] like to set you with repeat heart ultrasound. We will see if this can be done in [place].  Try gabapentin for burning leg pain - take $Remo'300mg'SQJcK$  at night, if doing well may increase to twice daily after 3 days.  Good to see you today.  Return to see me in 2 weeks  Follow up plan: Return in about 2 weeks (around 03/19/2022) for follow up visit.  [name], MD

## 2022-03-05 NOTE — Patient Instructions (Signed)
Check labs today We will refer you to wound center in [place] like to set you with repeat heart ultrasound. We will see if this can be done in [place].  Try gabapentin for burning leg pain - take '300mg'$  at night, if doing well may increase to twice daily after 3 days.  Good to see you today.  Return to see me in 2 weeks

## 2022-03-06 ENCOUNTER — Other Ambulatory Visit: Payer: Self-pay | Admitting: Family Medicine

## 2022-03-06 DIAGNOSIS — I87333 Chronic venous hypertension (idiopathic) with ulcer and inflammation of bilateral lower extremity: Secondary | ICD-10-CM | POA: Insufficient documentation

## 2022-03-06 MED ORDER — POTASSIUM CHLORIDE CRYS ER 10 MEQ PO TBCR: 10.0000 meq | EXTENDED_RELEASE_TABLET | Freq: Two times a day (BID) | ORAL | 1 refills | Status: DC

## 2022-03-06 NOTE — Assessment & Plan Note (Signed)
Last echo 05/2020.  With worsening pedal edema, will update echocardiogram - try to get done at [facility] as he lives near [place].

## 2022-03-06 NOTE — Assessment & Plan Note (Signed)
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 Worsened on exam 02/2022 - placed on keflex QID course.  Check wound culture today, finish antibiotic course.  Will wrap with ace wrap.  Will try to get plugged in with home health given he's unable to change dressings at home and will refer him to [facility] wound healing clinic in [place] per pt location preference.  I asked him to return in 2 wks, sooner if needed.

## 2022-03-06 NOTE — Assessment & Plan Note (Signed)
Marked improvement in blood pressure with med changes made last week. Update labs.

## 2022-03-06 NOTE — Assessment & Plan Note (Signed)
Continues only metformin '500mg'$  BID. Trouble affording farxiga - pending CCM referral placed last week.

## 2022-03-06 NOTE — Assessment & Plan Note (Addendum)
Chronic, now with inflammation and maceration/ulceration of entire R anterior leg.  Pending workup and wound clinic referral. Consider evaluation by vascular surgery.  For endorsed burning pain, will Rx gabapentin '300mg'$  nightly with option to increase to BID if needed.

## 2022-03-06 NOTE — Assessment & Plan Note (Signed)
Pending CCM consult

## 2022-03-07 ENCOUNTER — Telehealth: Payer: Self-pay | Admitting: Family Medicine

## 2022-03-07 ENCOUNTER — Telehealth: Payer: Self-pay

## 2022-03-07 NOTE — Chronic Care Management (AMB) (Signed)
  Chronic Care Management   Outreach Note  03/07/2022 Name: Gregory Fisher MRN: [id number] DOB: 1951-02-19  Referred by: [name], MD Reason for referral : No chief complaint on file.   An unsuccessful telephone outreach was attempted today. The patient was referred to the pharmacist for assistance with care management and care coordination.   Follow Up Plan:   [name] Upstream Scheduler

## 2022-03-07 NOTE — Telephone Encounter (Signed)
Attempted to contact pt.  No answer.  No vm.  Need to relay lab results and Dr. Synthia Innocent message.  Labs/Dr. Synthia Innocent msg: Your kidney function is worse - likely from recent med changes.  I want you to cut Lasix dose in half (to '40mg'$  daily), Valsartan dose in half (to '160mg'$ ) and drop metolazone 2.'5mg'$  to once weekly.   I recommend recheck labs next week.  Are your able to go to a local lab near [place] to get a renal panel drawn and have results faxed to [place] at [phone]?     Your potassium was low - recommend start potassium supplement 10 mEq daily sent to pharmacy.  Your sugar was much higher.  We will have you work with our pharmacy to see if we can get you assistance for [place].  I placed CCM referral last week.

## 2022-03-07 NOTE — Telephone Encounter (Signed)
Attempted to contact pt.  No answer.  No vm.  Need to relay lab results and Dr. Synthia Innocent message.  Labs/Dr. Synthia Innocent msg: Your kidney function is worse - likely from recent med changes.  I want you to cut Lasix dose in half (to '40mg'$  daily), Valsartan dose in half (to '160mg'$ ) and drop metolazone 2.'5mg'$  to once weekly.   I recommend recheck labs next week.  Are your able to go to a local lab near [place] to get a renal panel drawn and have results faxed to [place] at [phone]?     Your potassium was low - recommend start potassium supplement 10 mEq daily sent to pharmacy.  Your sugar was much higher.  We will have you work with our pharmacy to see if we can get you assistance for [place].  I placed CCM referral last week.

## 2022-03-09 LAB — WOUND CULTURE
MICRO NUMBER:: 13975736
SPECIMEN QUALITY:: ADEQUATE

## 2022-03-10 NOTE — Telephone Encounter (Signed)
Attempted to contact pt.  No answer.  No vm.  Need to relay lab results and Dr. Synthia Innocent message.  Labs/Dr. Synthia Innocent msg: Your kidney function is worse - likely from recent med changes.  I want you to cut Lasix dose in half (to '40mg'$  daily), Valsartan dose in half (to '160mg'$ ) and drop metolazone 2.'5mg'$  to once weekly.   I recommend recheck labs next week.  Are your able to go to a local lab near [place] to get a renal panel drawn and have results faxed to [place] at [phone]?     Your potassium was low - recommend start potassium supplement 10 mEq daily sent to pharmacy.  Your sugar was much higher.  We will have you work with our pharmacy to see if we can get you assistance for [place].  I placed CCM referral last week.

## 2022-03-11 ENCOUNTER — Other Ambulatory Visit: Payer: Self-pay | Admitting: Family Medicine

## 2022-03-11 MED ORDER — CIPROFLOXACIN HCL 250 MG PO TABS: 250.0000 mg | ORAL_TABLET | Freq: Two times a day (BID) | ORAL | 0 refills | Status: DC

## 2022-03-11 NOTE — Telephone Encounter (Signed)
Attempted to contact pt.  No answer.  No vm.  Need to relay lab results and Dr. Synthia Innocent message.  Labs/Dr. Synthia Innocent msg: Your kidney function is worse - likely from recent med changes.  I want you to cut Lasix dose in half (to '40mg'$  daily), Valsartan dose in half (to '160mg'$ ) and drop metolazone 2.'5mg'$  to once weekly.   I recommend recheck labs next week.  Are your able to go to a local lab near [place] to get a renal panel drawn and have results faxed to [place] at [phone]?     Your potassium was low - recommend start potassium supplement 10 mEq daily sent to pharmacy.  Your sugar was much higher.  We will have you work with our pharmacy to see if we can get you assistance for [place].  I placed CCM referral last week.   The wound culture returned growing bacteria resistant to antibiotics used so far.  Recommend he start ciprofloxacin antibiotic sent into pharmacy in place of Keflex.

## 2022-03-12 ENCOUNTER — Telehealth: Payer: Self-pay | Admitting: Family Medicine

## 2022-03-12 NOTE — Telephone Encounter (Signed)
Attempted to contact pt.  No answer.  No vm on cell/home #s.  Lvm on brother's, Gregory Fisher (no dpr), asking him to have pt call back asap. Need to relay lab results and Dr. Synthia Innocent message.  Labs/Dr. Synthia Innocent msg: Your kidney function is worse - likely from recent med changes.  I want you to cut Lasix dose in half (to '40mg'$  daily), Valsartan dose in half (to '160mg'$ ) and drop metolazone 2.'5mg'$  to once weekly.   I recommend recheck labs next week.  Are your able to go to a local lab near [place] to get a renal panel drawn and have results faxed to [place] at [phone]?     Your potassium was low - recommend start potassium supplement 10 mEq daily sent to pharmacy.  Your sugar was much higher.  We will have you work with our pharmacy to see if we can get you assistance for [place].  I placed CCM referral last week.   The wound culture returned growing bacteria resistant to antibiotics used so far.  Recommend he start ciprofloxacin antibiotic sent into pharmacy in place of Keflex.

## 2022-03-12 NOTE — Progress Notes (Signed)
error 

## 2022-03-12 NOTE — Chronic Care Management (AMB) (Signed)
  Chronic Care Management   Outreach Note  03/12/2022 Name: Gregory Fisher MRN: [id number] DOB: 04/25/1951  Referred by: [name], MD Reason for referral : No chief complaint on file.   A second unsuccessful telephone outreach was attempted today. The patient was referred to pharmacist for assistance with care management and care coordination.  Follow Up Plan:   [name] Upstream Scheduler

## 2022-03-14 DIAGNOSIS — E559 Vitamin D deficiency, unspecified: Secondary | ICD-10-CM | POA: Diagnosis not present

## 2022-03-14 DIAGNOSIS — R600 Localized edema: Secondary | ICD-10-CM | POA: Diagnosis not present

## 2022-03-14 DIAGNOSIS — G4700 Insomnia, unspecified: Secondary | ICD-10-CM | POA: Diagnosis not present

## 2022-03-14 DIAGNOSIS — M7989 Other specified soft tissue disorders: Secondary | ICD-10-CM | POA: Diagnosis not present

## 2022-03-14 DIAGNOSIS — M79604 Pain in right leg: Secondary | ICD-10-CM | POA: Diagnosis not present

## 2022-03-14 DIAGNOSIS — S90821A Blister (nonthermal), right foot, initial encounter: Secondary | ICD-10-CM | POA: Diagnosis not present

## 2022-03-14 DIAGNOSIS — I890 Lymphedema, not elsewhere classified: Secondary | ICD-10-CM | POA: Diagnosis not present

## 2022-03-14 DIAGNOSIS — N189 Chronic kidney disease, unspecified: Secondary | ICD-10-CM | POA: Diagnosis not present

## 2022-03-14 DIAGNOSIS — L538 Other specified erythematous conditions: Secondary | ICD-10-CM | POA: Diagnosis not present

## 2022-03-14 DIAGNOSIS — Z4800 Encounter for change or removal of nonsurgical wound dressing: Secondary | ICD-10-CM | POA: Diagnosis not present

## 2022-03-14 DIAGNOSIS — I130 Hypertensive heart and chronic kidney disease with heart failure and stage 1 through stage 4 chronic kidney disease, or unspecified chronic kidney disease: Secondary | ICD-10-CM | POA: Diagnosis not present

## 2022-03-14 DIAGNOSIS — I872 Venous insufficiency (chronic) (peripheral): Secondary | ICD-10-CM | POA: Diagnosis not present

## 2022-03-14 DIAGNOSIS — E11622 Type 2 diabetes mellitus with other skin ulcer: Secondary | ICD-10-CM | POA: Diagnosis not present

## 2022-03-14 DIAGNOSIS — G8929 Other chronic pain: Secondary | ICD-10-CM | POA: Diagnosis not present

## 2022-03-14 DIAGNOSIS — I87311 Chronic venous hypertension (idiopathic) with ulcer of right lower extremity: Secondary | ICD-10-CM | POA: Diagnosis not present

## 2022-03-14 DIAGNOSIS — L97909 Non-pressure chronic ulcer of unspecified part of unspecified lower leg with unspecified severity: Secondary | ICD-10-CM | POA: Diagnosis not present

## 2022-03-14 DIAGNOSIS — E1122 Type 2 diabetes mellitus with diabetic chronic kidney disease: Secondary | ICD-10-CM | POA: Diagnosis not present

## 2022-03-14 DIAGNOSIS — M79605 Pain in left leg: Secondary | ICD-10-CM | POA: Diagnosis not present

## 2022-03-14 DIAGNOSIS — T148XXA Other injury of unspecified body region, initial encounter: Secondary | ICD-10-CM | POA: Diagnosis not present

## 2022-03-14 DIAGNOSIS — L97211 Non-pressure chronic ulcer of right calf limited to breakdown of skin: Secondary | ICD-10-CM | POA: Diagnosis not present

## 2022-03-14 DIAGNOSIS — E1140 Type 2 diabetes mellitus with diabetic neuropathy, unspecified: Secondary | ICD-10-CM | POA: Diagnosis not present

## 2022-03-14 DIAGNOSIS — I50812 Chronic right heart failure: Secondary | ICD-10-CM | POA: Diagnosis not present

## 2022-03-14 DIAGNOSIS — L03115 Cellulitis of right lower limb: Secondary | ICD-10-CM | POA: Diagnosis not present

## 2022-03-14 DIAGNOSIS — Z792 Long term (current) use of antibiotics: Secondary | ICD-10-CM | POA: Diagnosis not present

## 2022-03-14 DIAGNOSIS — R531 Weakness: Secondary | ICD-10-CM | POA: Diagnosis not present

## 2022-03-17 DIAGNOSIS — L97211 Non-pressure chronic ulcer of right calf limited to breakdown of skin: Secondary | ICD-10-CM | POA: Diagnosis not present

## 2022-03-17 DIAGNOSIS — M545 Low back pain, unspecified: Secondary | ICD-10-CM | POA: Diagnosis not present

## 2022-03-17 DIAGNOSIS — G4700 Insomnia, unspecified: Secondary | ICD-10-CM | POA: Diagnosis not present

## 2022-03-17 DIAGNOSIS — Z792 Long term (current) use of antibiotics: Secondary | ICD-10-CM | POA: Diagnosis not present

## 2022-03-17 DIAGNOSIS — L03115 Cellulitis of right lower limb: Secondary | ICD-10-CM | POA: Diagnosis not present

## 2022-03-17 DIAGNOSIS — E1140 Type 2 diabetes mellitus with diabetic neuropathy, unspecified: Secondary | ICD-10-CM | POA: Diagnosis not present

## 2022-03-17 DIAGNOSIS — N170 Acute kidney failure with tubular necrosis: Secondary | ICD-10-CM | POA: Diagnosis not present

## 2022-03-17 DIAGNOSIS — E1122 Type 2 diabetes mellitus with diabetic chronic kidney disease: Secondary | ICD-10-CM | POA: Diagnosis not present

## 2022-03-17 DIAGNOSIS — G8929 Other chronic pain: Secondary | ICD-10-CM | POA: Diagnosis not present

## 2022-03-17 DIAGNOSIS — L538 Other specified erythematous conditions: Secondary | ICD-10-CM | POA: Diagnosis not present

## 2022-03-17 DIAGNOSIS — I50812 Chronic right heart failure: Secondary | ICD-10-CM | POA: Diagnosis not present

## 2022-03-17 DIAGNOSIS — N179 Acute kidney failure, unspecified: Secondary | ICD-10-CM | POA: Diagnosis not present

## 2022-03-17 DIAGNOSIS — R531 Weakness: Secondary | ICD-10-CM | POA: Diagnosis not present

## 2022-03-17 DIAGNOSIS — E8770 Fluid overload, unspecified: Secondary | ICD-10-CM | POA: Diagnosis not present

## 2022-03-17 DIAGNOSIS — I87311 Chronic venous hypertension (idiopathic) with ulcer of right lower extremity: Secondary | ICD-10-CM | POA: Diagnosis not present

## 2022-03-17 DIAGNOSIS — Z7984 Long term (current) use of oral hypoglycemic drugs: Secondary | ICD-10-CM | POA: Diagnosis not present

## 2022-03-17 DIAGNOSIS — E785 Hyperlipidemia, unspecified: Secondary | ICD-10-CM | POA: Diagnosis not present

## 2022-03-17 DIAGNOSIS — I959 Hypotension, unspecified: Secondary | ICD-10-CM | POA: Diagnosis not present

## 2022-03-17 DIAGNOSIS — I83212 Varicose veins of right lower extremity with both ulcer of calf and inflammation: Secondary | ICD-10-CM | POA: Diagnosis not present

## 2022-03-17 DIAGNOSIS — S2231XA Fracture of one rib, right side, initial encounter for closed fracture: Secondary | ICD-10-CM | POA: Diagnosis not present

## 2022-03-17 DIAGNOSIS — Z7982 Long term (current) use of aspirin: Secondary | ICD-10-CM | POA: Diagnosis not present

## 2022-03-17 DIAGNOSIS — E860 Dehydration: Secondary | ICD-10-CM | POA: Diagnosis not present

## 2022-03-17 DIAGNOSIS — Z87891 Personal history of nicotine dependence: Secondary | ICD-10-CM | POA: Diagnosis not present

## 2022-03-17 DIAGNOSIS — Z79899 Other long term (current) drug therapy: Secondary | ICD-10-CM | POA: Diagnosis not present

## 2022-03-17 DIAGNOSIS — Z4800 Encounter for change or removal of nonsurgical wound dressing: Secondary | ICD-10-CM | POA: Diagnosis not present

## 2022-03-17 DIAGNOSIS — N189 Chronic kidney disease, unspecified: Secondary | ICD-10-CM | POA: Diagnosis not present

## 2022-03-17 DIAGNOSIS — Z8679 Personal history of other diseases of the circulatory system: Secondary | ICD-10-CM | POA: Diagnosis not present

## 2022-03-17 DIAGNOSIS — E11622 Type 2 diabetes mellitus with other skin ulcer: Secondary | ICD-10-CM | POA: Diagnosis not present

## 2022-03-17 DIAGNOSIS — I890 Lymphedema, not elsewhere classified: Secondary | ICD-10-CM | POA: Diagnosis not present

## 2022-03-17 DIAGNOSIS — I10 Essential (primary) hypertension: Secondary | ICD-10-CM | POA: Diagnosis not present

## 2022-03-17 DIAGNOSIS — E861 Hypovolemia: Secondary | ICD-10-CM | POA: Diagnosis not present

## 2022-03-17 DIAGNOSIS — R638 Other symptoms and signs concerning food and fluid intake: Secondary | ICD-10-CM | POA: Diagnosis not present

## 2022-03-17 DIAGNOSIS — R42 Dizziness and giddiness: Secondary | ICD-10-CM | POA: Diagnosis not present

## 2022-03-17 DIAGNOSIS — E119 Type 2 diabetes mellitus without complications: Secondary | ICD-10-CM | POA: Diagnosis not present

## 2022-03-17 DIAGNOSIS — S90821A Blister (nonthermal), right foot, initial encounter: Secondary | ICD-10-CM | POA: Diagnosis not present

## 2022-03-17 DIAGNOSIS — N1831 Chronic kidney disease, stage 3a: Secondary | ICD-10-CM | POA: Diagnosis not present

## 2022-03-17 DIAGNOSIS — I872 Venous insufficiency (chronic) (peripheral): Secondary | ICD-10-CM | POA: Diagnosis not present

## 2022-03-17 DIAGNOSIS — E559 Vitamin D deficiency, unspecified: Secondary | ICD-10-CM | POA: Diagnosis not present

## 2022-03-17 DIAGNOSIS — I130 Hypertensive heart and chronic kidney disease with heart failure and stage 1 through stage 4 chronic kidney disease, or unspecified chronic kidney disease: Secondary | ICD-10-CM | POA: Diagnosis not present

## 2022-03-18 ENCOUNTER — Other Ambulatory Visit: Payer: Self-pay | Admitting: Family Medicine

## 2022-03-18 DIAGNOSIS — I5032 Chronic diastolic (congestive) heart failure: Secondary | ICD-10-CM | POA: Diagnosis not present

## 2022-03-18 DIAGNOSIS — E876 Hypokalemia: Secondary | ICD-10-CM | POA: Diagnosis not present

## 2022-03-18 DIAGNOSIS — R600 Localized edema: Secondary | ICD-10-CM | POA: Diagnosis not present

## 2022-03-18 DIAGNOSIS — L97211 Non-pressure chronic ulcer of right calf limited to breakdown of skin: Secondary | ICD-10-CM | POA: Diagnosis not present

## 2022-03-18 DIAGNOSIS — I890 Lymphedema, not elsewhere classified: Secondary | ICD-10-CM | POA: Diagnosis not present

## 2022-03-18 DIAGNOSIS — I959 Hypotension, unspecified: Secondary | ICD-10-CM | POA: Diagnosis not present

## 2022-03-18 DIAGNOSIS — N170 Acute kidney failure with tubular necrosis: Secondary | ICD-10-CM | POA: Diagnosis not present

## 2022-03-18 DIAGNOSIS — E119 Type 2 diabetes mellitus without complications: Secondary | ICD-10-CM | POA: Diagnosis not present

## 2022-03-18 DIAGNOSIS — I110 Hypertensive heart disease with heart failure: Secondary | ICD-10-CM | POA: Diagnosis not present

## 2022-03-18 DIAGNOSIS — Z79899 Other long term (current) drug therapy: Secondary | ICD-10-CM | POA: Diagnosis not present

## 2022-03-18 DIAGNOSIS — E861 Hypovolemia: Secondary | ICD-10-CM | POA: Diagnosis not present

## 2022-03-18 NOTE — Telephone Encounter (Signed)
Tried to call patient  and unable to leave a message. No answer and no voicemail.

## 2022-03-19 ENCOUNTER — Telehealth: Payer: Self-pay | Admitting: Family Medicine

## 2022-03-19 NOTE — Progress Notes (Signed)
  Chronic Care Management   Outreach Note  03/19/2022 Name: HAWKE VILLALPANDO MRN: [id number] DOB: 05/08/1951  Referred by: [name], MD Reason for referral : No chief complaint on file.   A second unsuccessful telephone outreach was attempted today. The patient was referred to pharmacist for assistance with care management and care coordination.  Follow Up Plan:   [name] Upstream Scheduler

## 2022-03-20 DIAGNOSIS — M7138 Other bursal cyst, other site: Secondary | ICD-10-CM | POA: Diagnosis not present

## 2022-03-20 DIAGNOSIS — M7121 Synovial cyst of popliteal space [Baker], right knee: Secondary | ICD-10-CM | POA: Diagnosis not present

## 2022-03-20 DIAGNOSIS — R600 Localized edema: Secondary | ICD-10-CM | POA: Diagnosis not present

## 2022-03-20 DIAGNOSIS — M7122 Synovial cyst of popliteal space [Baker], left knee: Secondary | ICD-10-CM | POA: Diagnosis not present

## 2022-03-20 NOTE — Telephone Encounter (Addendum)
Attempted to contact pt.  No answer.  No vm on cell/home #s.  Lvm on brother's, Iona Beard (no dpr), asking him to have pt call back asap. Need to relay lab results and Dr. Synthia Innocent message.  Attempted several times/days to contact pt with no response.  Mailing a letter.  Fyi to Dr. Darnell Level  Labs/Dr. G's msg: Your kidney function is worse - likely from recent med changes.  I want you to cut Lasix dose in half (to '40mg'$  daily), Valsartan dose in half (to '160mg'$ ) and drop metolazone 2.'5mg'$  to once weekly.   I recommend recheck labs next week.  Are your able to go to a local lab near [place] to get a renal panel drawn and have results faxed to [place] at [phone]?     Your potassium was low - recommend start potassium supplement 10 mEq daily sent to pharmacy.  Your sugar was much higher.  We will have you work with our pharmacy to see if we can get you assistance for [place].  I placed CCM referral last week.   The wound culture returned growing bacteria resistant to antibiotics used so far.  Recommend he start ciprofloxacin antibiotic sent into pharmacy in place of Keflex.

## 2022-03-21 ENCOUNTER — Ambulatory Visit: Payer: Medicare Other | Admitting: Family Medicine

## 2022-03-24 DIAGNOSIS — Z87891 Personal history of nicotine dependence: Secondary | ICD-10-CM | POA: Diagnosis not present

## 2022-03-24 DIAGNOSIS — K5730 Diverticulosis of large intestine without perforation or abscess without bleeding: Secondary | ICD-10-CM | POA: Diagnosis not present

## 2022-03-24 DIAGNOSIS — K769 Liver disease, unspecified: Secondary | ICD-10-CM | POA: Diagnosis not present

## 2022-03-24 DIAGNOSIS — R042 Hemoptysis: Secondary | ICD-10-CM | POA: Diagnosis not present

## 2022-03-24 DIAGNOSIS — R0602 Shortness of breath: Secondary | ICD-10-CM | POA: Diagnosis not present

## 2022-03-24 DIAGNOSIS — R059 Cough, unspecified: Secondary | ICD-10-CM | POA: Diagnosis not present

## 2022-03-24 DIAGNOSIS — N189 Chronic kidney disease, unspecified: Secondary | ICD-10-CM | POA: Diagnosis not present

## 2022-03-24 DIAGNOSIS — G8929 Other chronic pain: Secondary | ICD-10-CM | POA: Diagnosis not present

## 2022-03-24 DIAGNOSIS — L97809 Non-pressure chronic ulcer of other part of unspecified lower leg with unspecified severity: Secondary | ICD-10-CM | POA: Diagnosis not present

## 2022-03-24 DIAGNOSIS — I130 Hypertensive heart and chronic kidney disease with heart failure and stage 1 through stage 4 chronic kidney disease, or unspecified chronic kidney disease: Secondary | ICD-10-CM | POA: Diagnosis not present

## 2022-03-24 DIAGNOSIS — Z79899 Other long term (current) drug therapy: Secondary | ICD-10-CM | POA: Diagnosis not present

## 2022-03-24 DIAGNOSIS — E1122 Type 2 diabetes mellitus with diabetic chronic kidney disease: Secondary | ICD-10-CM | POA: Diagnosis not present

## 2022-03-24 DIAGNOSIS — T148XXA Other injury of unspecified body region, initial encounter: Secondary | ICD-10-CM | POA: Diagnosis not present

## 2022-03-24 DIAGNOSIS — K859 Acute pancreatitis without necrosis or infection, unspecified: Secondary | ICD-10-CM | POA: Diagnosis not present

## 2022-03-24 DIAGNOSIS — E1140 Type 2 diabetes mellitus with diabetic neuropathy, unspecified: Secondary | ICD-10-CM | POA: Diagnosis not present

## 2022-03-24 DIAGNOSIS — Z7984 Long term (current) use of oral hypoglycemic drugs: Secondary | ICD-10-CM | POA: Diagnosis not present

## 2022-03-24 DIAGNOSIS — E785 Hyperlipidemia, unspecified: Secondary | ICD-10-CM | POA: Diagnosis not present

## 2022-03-24 DIAGNOSIS — J40 Bronchitis, not specified as acute or chronic: Secondary | ICD-10-CM | POA: Diagnosis not present

## 2022-03-24 DIAGNOSIS — M545 Low back pain, unspecified: Secondary | ICD-10-CM | POA: Diagnosis not present

## 2022-03-26 ENCOUNTER — Encounter: Payer: Self-pay | Admitting: Family Medicine

## 2022-03-26 ENCOUNTER — Telehealth: Payer: Self-pay

## 2022-03-26 ENCOUNTER — Telehealth: Payer: Self-pay | Admitting: Family Medicine

## 2022-03-26 ENCOUNTER — Ambulatory Visit (INDEPENDENT_AMBULATORY_CARE_PROVIDER_SITE_OTHER): Payer: Medicare Other | Admitting: Family Medicine

## 2022-03-26 VITALS — BP 134/70 | HR 94 | Temp 97.4°F | Ht 66.5 in | Wt 279.5 lb

## 2022-03-26 DIAGNOSIS — Z79899 Other long term (current) drug therapy: Secondary | ICD-10-CM | POA: Diagnosis not present

## 2022-03-26 DIAGNOSIS — Z5982 Transportation insecurity: Secondary | ICD-10-CM | POA: Diagnosis not present

## 2022-03-26 DIAGNOSIS — E1169 Type 2 diabetes mellitus with other specified complication: Secondary | ICD-10-CM | POA: Diagnosis not present

## 2022-03-26 DIAGNOSIS — K5790 Diverticulosis of intestine, part unspecified, without perforation or abscess without bleeding: Secondary | ICD-10-CM

## 2022-03-26 DIAGNOSIS — K5710 Diverticulosis of small intestine without perforation or abscess without bleeding: Secondary | ICD-10-CM | POA: Diagnosis not present

## 2022-03-26 DIAGNOSIS — I701 Atherosclerosis of renal artery: Secondary | ICD-10-CM

## 2022-03-26 DIAGNOSIS — I700 Atherosclerosis of aorta: Secondary | ICD-10-CM

## 2022-03-26 DIAGNOSIS — I10 Essential (primary) hypertension: Secondary | ICD-10-CM

## 2022-03-26 DIAGNOSIS — N179 Acute kidney failure, unspecified: Secondary | ICD-10-CM | POA: Diagnosis not present

## 2022-03-26 DIAGNOSIS — I87333 Chronic venous hypertension (idiopathic) with ulcer and inflammation of bilateral lower extremity: Secondary | ICD-10-CM

## 2022-03-26 NOTE — Progress Notes (Signed)
  Chronic Care Management   Outreach Note  03/26/2022 Name: Gregory Fisher MRN: [id number] DOB: 25-Nov-1950  Referred by: [name], MD Reason for referral : No chief complaint on file.   Third unsuccessful telephone outreach was attempted today. The patient was referred to the pharmacist for assistance with care management and care coordination.   Follow Up Plan:   [name] Upstream Scheduler

## 2022-03-26 NOTE — Patient Instructions (Addendum)
Labs today  Double check home medicines with your list today.  Stop metolazone and valsartan  Take furosemide '40mg'$  (1/2 tablet) once daily. Continue potassium 32mq twice daily.  I will ask home health nurse to come to your house.  Return in 2 weeks for follow up visit.

## 2022-03-26 NOTE — Telephone Encounter (Signed)
Colletta Maryland from [facility] called to let Dr Darnell Level know that they received a referral for palliative care for patient,so their nurses will be going out to assess him.

## 2022-03-26 NOTE — Progress Notes (Signed)
  Chronic Care Management Pharmacy Assistant   Name: Gregory Fisher  MRN: [id number] DOB: 02/22/1951  Reason for Encounter: Non-CCM (Hospital Follow-Up)   Medications: Outpatient Encounter Medications as of 03/26/2022  Medication Sig Note   Accu-Chek Softclix Lancets lancets Use as instructed to check blood sugar 2 (times) a day    aspirin EC 81 MG tablet Take 81 mg by mouth daily.  08/29/2015: Does not take on a regular basis    atorvastatin (LIPITOR) 80 MG tablet Take 1 tablet (80 mg total) by mouth daily.    Blood Glucose Monitoring Suppl (ACCU-CHEK GUIDE ME) w/Device KIT Use as instructed to check blood sugar 2 (times) a day    carvedilol (COREG) 12.5 MG tablet Take 1 tablet (12.5 mg total) by mouth 2 (two) times daily with a meal.    cephALEXin (KEFLEX) 500 MG capsule Take 1 capsule (500 mg total) by mouth 4 (four) times daily.    Cholecalciferol (VITAMIN D3) 25 MCG (1000 UT) CAPS Take 2 capsules (2,000 Units total) by mouth daily.    ciprofloxacin (CIPRO) 250 MG tablet Take 1 tablet (250 mg total) by mouth 2 (two) times daily.    cloNIDine (CATAPRES) 0.1 MG tablet Take 1 tablet (0.1 mg total) by mouth 2 (two) times daily.    cyanocobalamin 1000 MCG tablet Take 1,000 mcg by mouth daily.    dapagliflozin propanediol (FARXIGA) 5 MG TABS tablet Take 1 tablet (5 mg total) by mouth daily before breakfast.    furosemide (LASIX) 80 MG tablet Take 0.5 tablets (40 mg total) by mouth daily.    gabapentin (NEURONTIN) 300 MG capsule Take 1 capsule (300 mg total) by mouth 2 (two) times daily as needed (leg pain).    glucose blood (ACCU-CHEK GUIDE) test strip Use as instructed to check blood sugar 2 (times) a day    LORazepam (ATIVAN) 1 MG tablet TAKE 1 TABLET BY MOUTH EVERY DAY AS NEEDED FOR ANXIETY    magnesium hydroxide (MILK OF MAGNESIA) 400 MG/5ML suspension Take 5 mL by mouth daily as needed for constipation.    metFORMIN (GLUCOPHAGE) 500 MG tablet Take 1 tablet (500 mg total) by mouth 2  (two) times daily with a meal.    metolazone (ZAROXOLYN) 2.5 MG tablet Take 1 tablet (2.5 mg total) by mouth once a week.    Naproxen Sodium (ALEVE PO) Take 1 tablet by mouth daily as needed.    potassium chloride (KLOR-CON M) 10 MEQ tablet Take 1 tablet (10 mEq total) by mouth 2 (two) times daily.    tamsulosin (FLOMAX) 0.4 MG CAPS capsule Take 2 capsules (0.8 mg total) by mouth at bedtime.    valsartan (DIOVAN) 320 MG tablet Take 0.5 tablets (160 mg total) by mouth daily.    vitamin C (ASCORBIC ACID) 500 MG tablet Take 500 mg by mouth daily.    No facility-administered encounter medications on file as of 03/26/2022.   Reviewed hospital notes for details of recent visit. Has patient been contacted by Transitions of Care team? No Has patient seen PCP/specialist for hospital follow up (summarize OV if yes): Yes  03/20/22 Reginald Cathey, MD (Cardiology) No evidence of DVT within either lower extremity. Incidentally noted bilateral Baker's cysts, the right measuring 3.9 cm and the left measuring 4.5 cm.  Reviewed hospital notes for details of recent visit. Has patient been contacted by Transitions of Care team? No Has patient seen PCP/specialist for hospital follow up (summarize OV if yes): No  Admitted to the ED   on 03/24/2022. Discharge date was 03/24/2022.  Discharged from [facility].   Discharge diagnosis (Principal Problem): Hemoptysis  Patient was discharged to Home  Brief summary of hospital course: Gregory Fisher is a 71 y.o. male who presents today to the emergency department complaining of coughing up blood for the last 5 days. He felt like he had something stuck in his throat and started coughing 5 days ago, then he started noticing dark blood in his sputum. He has been coughing up dark blood every day since then.  EKG shows a sinus rhythm with a rate of 78, but no acute infarct/injury pattern. CXR does not show any acute cardiopulmonary process. WBC is 8.9, hemoglobin is  10.3, PT is 10.4, PTT is 25.7, INR is 1.02, BUN is 52, creatinine is 1.71 glucose is 125, lipase is 674 and UA does not indicate a UTI. Given the elevated lipase and hemoptysis, will get a CTA of the chest. We will also get a CT of the abdomen, given the asymptomatic pancreatitis. CTA of the chest does not indicate a PE, but does show some airway thickening consistent with bronchitis. CT of the abdomen shows a small left adrenal adenoma, an umbilical hernia without evidence of obstruction and diverticulosis. Advised NSAIDs and/or Tylenol as needed for pain or fever, alcohol avoidance, increase fluids and a bland/low-fat diet. Follow-up with PCP.   Medications that remain the same after Hospital Discharge:??  -All other medications will remain the same.    Next CCM appt: Non-CCM  Other upcoming appts: PCP appointment on 03/26/2022  Lindsey Foltanski, PharmD notified and will determine if action is needed.  Admitted to the hospital on 03/17/2022. Discharge date was 03/18/2022.  Discharged from [facility].   Discharge diagnosis (Principal Problem): Hypotension due to hypovolemia   Patient was discharged to Home  Brief summary of hospital course: Gregory Fisher is a 71 y.o. male that presented to [facility] and was admitted for Hypotension due to hypovolemia on 03/17/2022 9:02 AM .  Please refer to the history and physical for details. Briefly patient is a 71-year-old male who has past medical history significant for multiple medical problems including morbid obesity, type 2 diabetes, CKD, hypertension, hyperlipidemia, chronic lymphedema who was sent to the hospital from wound care clinic after he was found to have a low blood pressure (71s over 40s. Patient himself reported feeling somewhat lightheaded and dizzy, however denied any syncopal spells or passing out. Work-up in the ER showed evidence of acute kidney injury superimposed on CKD, he was also somewhat hypotensive. His BUN  was 48 and serum creatinine was 2.72, lactate was mildly elevated at 2.3.  #1. Hypotension, likely secondary to hypovolemia/acute kidney injury superimposed on CKD 3A. Hypovolemia likely related to patient's several medications including diuretics, several blood pressure medications such as Diovan, Coreg, clonidine. Patient was admitted, his diuretics and blood pressure medications were held. He received gentle IV fluid bolus. Blood pressure seems to have responded, patient has been relatively asymptomatic. His labs have shown improvement in BUN/creatinine, BUN is improved to 47 and creatinine is 2.11.  Since patient has improved significantly, we will discharge patient home, however recommend that he continues to hold his diuretics until he follows up with his primary care physician within the next few days, will also hold his Diovan, we recommend that he monitors his blood pressure prior to taking Coreg and clonidine and if systolic blood pressure is less than100, do not take those medicines. Patient was instructed to   follow-up with primary care physician to recheck his renal function.  #2. Hypokalemia. Was replaced, will be discharged on small dose of potassium supplements, will need repeat labs with PCP.  #3. Chronic bilateral lower extremity lymphedema/venous stasis. Patient to continue follow-up with wound clinic.  #4. Essential hypertension. Blood pressure was low upon admission, his medications were held.  #5. Type 2 diabetes. Since blood pressure has improved, renal function appears to be improving and lactate has normalized, could restart metformin.   New?Medications Started at Hospital Discharge:?? No new medications  Medication Changes at Hospital Discharge: -Changed potassium chloride 10 MEQ ER tablet  Medications Discontinued at Hospital Discharge: -Stopped ascorbic acid (vitamin C) 500 MG tablet -Stopped aspirin 81 MG tablet -Stopped cholecalciferol (vitamin D3-25 mcg (1,000  unit)) 25 mcg (1,000 unit) capsule -Stopped ciprofloxacin HCl 250 MG tablet -Stopped furosemide 20 MG tablet -Stopped guaiFENesin 100 mg/5 mL syrup -StoppedmetOLazone 2.5 MG tablet -Stoppedvalsartan 320 MG tablet  Medications that remain the same after Hospital Discharge:??  -All other medications will remain the same.     

## 2022-03-26 NOTE — Progress Notes (Unsigned)
Patient ID: Gregory Fisher, male    DOB: [birth year], 71 y.o.   MRN: [id number]  This visit was conducted in person.  BP 134/70   Pulse 94   Temp (!) 97.4 F (36.3 C) (Temporal)   Ht 5' 6.5" (1.689 m)   Wt 279 lb 8 oz (126.8 kg)   SpO2 95%   BMI 44.44 kg/m    CC: hosp f/u visit  Subjective:   HPI: Dallis EINO WHITNER is a 71 y.o. male presenting on 03/26/2022 for Hospitalization Follow-up (Seen on 03/17/22 at [facility] ED, dx HTN due to hypovolemia; dehydration.  Then seen on 03/24/22 at [facility] ED, dx hemoptysis; bronchitis; acute pancreatitis without infection/necrosis. )   Recent hospitalization x2. Initially for hypotension due to hypovolemia with acute kidney injury superimposed on CKD, Cr 2.72. BP meds held, Cr on discharge 2.11. Diuretics (lasix and metolazone) and diovan were held, carvedilol and clonidine were continued. He however has continued taking all prior medicines.    Second hospitalization 03/24/2022 for acute bronchitis with hemoptysis x1, found to have asymptomatic pancreatitis with lipase to 674. Hgb 10.3 at that time. Had CTA chest as well as CT abdomen given elevated lipase.   Hospital records reviewed. Med rec performed.  He is confused over medications.  He doesn't think he took cipro antibiotic, instead took doxycycline.   Home health not yet set up through wound clinic.  Other follow up appointments scheduled: 10/27 Dr Nicholes Stairs ______________________________________________________________________ Hospital admission: 03/17/2022 Hospital discharge: *** TCM f/u phone call: *** performed       Relevant past medical, surgical, family and social history reviewed and updated as indicated. Interim medical history since our last visit reviewed. Allergies and medications reviewed and updated. Outpatient Medications Prior to Visit  Medication Sig Dispense Refill   Accu-Chek Softclix Lancets lancets Use as instructed to check blood sugar 2 (times) a day 200 each 3    aspirin EC 81 MG tablet Take 81 mg by mouth daily.      atorvastatin (LIPITOR) 80 MG tablet Take 1 tablet (80 mg total) by mouth daily. 90 tablet 3   Blood Glucose Monitoring Suppl (ACCU-CHEK GUIDE ME) w/Device KIT Use as instructed to check blood sugar 2 (times) a day 1 kit 0   carvedilol (COREG) 12.5 MG tablet Take 1 tablet (12.5 mg total) by mouth 2 (two) times daily with a meal. 180 tablet 3   Cholecalciferol (VITAMIN D3) 25 MCG (1000 UT) CAPS Take 2 capsules (2,000 Units total) by mouth daily. 30 capsule    ciprofloxacin (CIPRO) 250 MG tablet Take 1 tablet (250 mg total) by mouth 2 (two) times daily. 14 tablet 0   cloNIDine (CATAPRES) 0.1 MG tablet Take 1 tablet (0.1 mg total) by mouth 2 (two) times daily. 180 tablet 3   cyanocobalamin 1000 MCG tablet Take 1,000 mcg by mouth daily.     dapagliflozin propanediol (FARXIGA) 5 MG TABS tablet Take 1 tablet (5 mg total) by mouth daily before breakfast. 30 tablet 11   furosemide (LASIX) 80 MG tablet Take 0.5 tablets (40 mg total) by mouth daily.     gabapentin (NEURONTIN) 300 MG capsule Take 1 capsule (300 mg total) by mouth 2 (two) times daily as needed (leg pain). 60 capsule 1   glucose blood (ACCU-CHEK GUIDE) test strip Use as instructed to check blood sugar 2 (times) a day 200 strip 3   LORazepam (ATIVAN) 1 MG tablet TAKE 1 TABLET BY MOUTH EVERY DAY AS NEEDED FOR ANXIETY  40 tablet 0   magnesium hydroxide (MILK OF MAGNESIA) 400 MG/5ML suspension Take 5 mL by mouth daily as needed for constipation.     metFORMIN (GLUCOPHAGE) 500 MG tablet Take 1 tablet (500 mg total) by mouth 2 (two) times daily with a meal. 180 tablet 3   metolazone (ZAROXOLYN) 2.5 MG tablet Take 1 tablet (2.5 mg total) by mouth once a week. 30 tablet    Naproxen Sodium (ALEVE PO) Take 1 tablet by mouth daily as needed.     potassium chloride (KLOR-CON M) 10 MEQ tablet Take 1 tablet (10 mEq total) by mouth 2 (two) times daily. 90 tablet 1   tamsulosin (FLOMAX) 0.4 MG CAPS capsule  Take 2 capsules (0.8 mg total) by mouth at bedtime. 180 capsule 3   valsartan (DIOVAN) 320 MG tablet Take 0.5 tablets (160 mg total) by mouth daily.     vitamin C (ASCORBIC ACID) 500 MG tablet Take 500 mg by mouth daily.     cephALEXin (KEFLEX) 500 MG capsule Take 1 capsule (500 mg total) by mouth 4 (four) times daily. (Patient not taking: Reported on 03/26/2022) 28 capsule 0   No facility-administered medications prior to visit.     Per HPI unless specifically indicated in ROS section below Review of Systems  Objective:  BP 134/70   Pulse 94   Temp (!) 97.4 F (36.3 C) (Temporal)   Ht 5' 6.5" (1.689 m)   Wt 279 lb 8 oz (126.8 kg)   SpO2 95%   BMI 44.44 kg/m   Wt Readings from Last 3 Encounters:  03/26/22 279 lb 8 oz (126.8 kg)  03/05/22 279 lb 2 oz (126.6 kg)  02/28/22 291 lb (132 kg)      Physical Exam    Results for orders placed or performed in visit on 03/05/22  WOUND CULTURE   Specimen: Blood  Result Value Ref Range   MICRO NUMBER: [id number]    SPECIMEN QUALITY: Adequate    SOURCE: RIGHT LEG    STATUS: FINAL    GRAM STAIN:      No white blood cells seen Rare epithelial cells Many Gram negative bacilli Few Gram positive cocci in clusters   RESULT:      Additional organisms of questionable significance were isolated that normally do not warrant identification and susceptibilities. Please contact the laboratory within three days if identification and susceptibilities are clinically indicated.   ISOLATE 1: Pseudomonas aeruginosa (A)       Susceptibility   Pseudomonas aeruginosa - AEROBIC CULT, GRAM STAIN NEGATIVE 1    CEFTAZIDIME 4 Sensitive     CEFEPIME 2 Sensitive     CIPROFLOXACIN <=0.25 Sensitive     LEVOFLOXACIN 0.25 Sensitive     GENTAMICIN <=1 Sensitive     IMIPENEM 2 Sensitive     PIP/TAZO 8 Sensitive     TOBRAMYCIN* <=1 Sensitive      * Legend: S = Susceptible  I = Intermediate R = Resistant  NS = Not susceptible * = Not tested  NR = Not reported **NN  = See antimicrobic comments   CBC with Differential/Platelet  Result Value Ref Range   WBC 10.8 (H) 4.0 - 10.5 K/uL   RBC 4.19 (L) 4.22 - 5.81 Mil/uL   Hemoglobin 13.0 13.0 - 17.0 g/dL   HCT 37.7 (L) 39.0 - 52.0 %   MCV 89.9 78.0 - 100.0 fl   MCHC 34.4 30.0 - 36.0 g/dL   RDW 13.3 11.5 - 15.5 %  Platelets 312.0 150.0 - 400.0 K/uL   Neutrophils Relative % 80.7 (H) 43.0 - 77.0 %   Lymphocytes Relative 8.2 (L) 12.0 - 46.0 %   Monocytes Relative 8.7 3.0 - 12.0 %   Eosinophils Relative 1.9 0.0 - 5.0 %   Basophils Relative 0.5 0.0 - 3.0 %   Neutro Abs 8.7 (H) 1.4 - 7.7 K/uL   Lymphs Abs 0.9 0.7 - 4.0 K/uL   Monocytes Absolute 0.9 0.1 - 1.0 K/uL   Eosinophils Absolute 0.2 0.0 - 0.7 K/uL   Basophils Absolute 0.1 0.0 - 0.1 K/uL  Comprehensive metabolic panel  Result Value Ref Range   Sodium 134 (L) 135 - 145 mEq/L   Potassium 3.3 (L) 3.5 - 5.1 mEq/L   Chloride 92 (L) 96 - 112 mEq/L   CO2 34 (H) 19 - 32 mEq/L   Glucose, Bld 244 (H) 70 - 99 mg/dL   BUN 26 (H) 6 - 23 mg/dL   Creatinine, Ser 1.79 (H) 0.40 - 1.50 mg/dL   Total Bilirubin 0.7 0.2 - 1.2 mg/dL   Alkaline Phosphatase 82 39 - 117 U/L   AST 15 0 - 37 U/L   ALT 16 0 - 53 U/L   Total Protein 7.6 6.0 - 8.3 g/dL   Albumin 3.7 3.5 - 5.2 g/dL   GFR 37.82 (L) >60.00 mL/min   Calcium 9.6 8.4 - 10.5 mg/dL    Assessment & Plan:   Problem List Items Addressed This Visit   None    No orders of the defined types were placed in this encounter.  No orders of the defined types were placed in this encounter.    Patient Instructions  Labs today  Double check home medicines with your list today.  Stop metolazone and valsartan  Take furosemide 83m (1/2 tablet) once daily. Continue potassium 124m twice daily.  I will ask home health nurse to come to your house.  Return in 2 weeks for follow up visit.   Follow up plan: No follow-ups on file.  JaRia BushMD

## 2022-03-27 DIAGNOSIS — I5032 Chronic diastolic (congestive) heart failure: Secondary | ICD-10-CM | POA: Diagnosis not present

## 2022-03-27 DIAGNOSIS — I9589 Other hypotension: Secondary | ICD-10-CM | POA: Diagnosis not present

## 2022-03-27 DIAGNOSIS — I890 Lymphedema, not elsewhere classified: Secondary | ICD-10-CM | POA: Diagnosis not present

## 2022-03-27 DIAGNOSIS — E1151 Type 2 diabetes mellitus with diabetic peripheral angiopathy without gangrene: Secondary | ICD-10-CM | POA: Diagnosis not present

## 2022-03-27 DIAGNOSIS — I700 Atherosclerosis of aorta: Secondary | ICD-10-CM | POA: Insufficient documentation

## 2022-03-27 DIAGNOSIS — Z4800 Encounter for change or removal of nonsurgical wound dressing: Secondary | ICD-10-CM | POA: Diagnosis not present

## 2022-03-27 DIAGNOSIS — I6529 Occlusion and stenosis of unspecified carotid artery: Secondary | ICD-10-CM | POA: Diagnosis not present

## 2022-03-27 DIAGNOSIS — M8580 Other specified disorders of bone density and structure, unspecified site: Secondary | ICD-10-CM | POA: Diagnosis not present

## 2022-03-27 DIAGNOSIS — K5710 Diverticulosis of small intestine without perforation or abscess without bleeding: Secondary | ICD-10-CM | POA: Insufficient documentation

## 2022-03-27 DIAGNOSIS — K5790 Diverticulosis of intestine, part unspecified, without perforation or abscess without bleeding: Secondary | ICD-10-CM | POA: Insufficient documentation

## 2022-03-27 DIAGNOSIS — E1122 Type 2 diabetes mellitus with diabetic chronic kidney disease: Secondary | ICD-10-CM | POA: Diagnosis not present

## 2022-03-27 DIAGNOSIS — G8929 Other chronic pain: Secondary | ICD-10-CM | POA: Diagnosis not present

## 2022-03-27 DIAGNOSIS — M545 Low back pain, unspecified: Secondary | ICD-10-CM | POA: Diagnosis not present

## 2022-03-27 DIAGNOSIS — L97211 Non-pressure chronic ulcer of right calf limited to breakdown of skin: Secondary | ICD-10-CM | POA: Diagnosis not present

## 2022-03-27 DIAGNOSIS — I701 Atherosclerosis of renal artery: Secondary | ICD-10-CM | POA: Insufficient documentation

## 2022-03-27 DIAGNOSIS — N170 Acute kidney failure with tubular necrosis: Secondary | ICD-10-CM | POA: Diagnosis not present

## 2022-03-27 DIAGNOSIS — E559 Vitamin D deficiency, unspecified: Secondary | ICD-10-CM | POA: Diagnosis not present

## 2022-03-27 DIAGNOSIS — I10 Essential (primary) hypertension: Secondary | ICD-10-CM | POA: Diagnosis not present

## 2022-03-27 DIAGNOSIS — I130 Hypertensive heart and chronic kidney disease with heart failure and stage 1 through stage 4 chronic kidney disease, or unspecified chronic kidney disease: Secondary | ICD-10-CM | POA: Diagnosis not present

## 2022-03-27 DIAGNOSIS — E1140 Type 2 diabetes mellitus with diabetic neuropathy, unspecified: Secondary | ICD-10-CM | POA: Diagnosis not present

## 2022-03-27 DIAGNOSIS — N179 Acute kidney failure, unspecified: Secondary | ICD-10-CM | POA: Insufficient documentation

## 2022-03-27 DIAGNOSIS — Z7984 Long term (current) use of oral hypoglycemic drugs: Secondary | ICD-10-CM | POA: Diagnosis not present

## 2022-03-27 DIAGNOSIS — Z79899 Other long term (current) drug therapy: Secondary | ICD-10-CM | POA: Insufficient documentation

## 2022-03-27 DIAGNOSIS — L03115 Cellulitis of right lower limb: Secondary | ICD-10-CM | POA: Diagnosis not present

## 2022-03-27 DIAGNOSIS — N1831 Chronic kidney disease, stage 3a: Secondary | ICD-10-CM | POA: Diagnosis not present

## 2022-03-27 DIAGNOSIS — I50812 Chronic right heart failure: Secondary | ICD-10-CM | POA: Diagnosis not present

## 2022-03-27 DIAGNOSIS — I872 Venous insufficiency (chronic) (peripheral): Secondary | ICD-10-CM | POA: Diagnosis not present

## 2022-03-27 DIAGNOSIS — E785 Hyperlipidemia, unspecified: Secondary | ICD-10-CM | POA: Diagnosis not present

## 2022-03-27 LAB — RENAL FUNCTION PANEL
Albumin: 3.8 g/dL (ref 3.5–5.2)
BUN: 34 mg/dL — ABNORMAL HIGH (ref 6–23)
CO2: 33 mEq/L — ABNORMAL HIGH (ref 19–32)
Calcium: 9.9 mg/dL (ref 8.4–10.5)
Chloride: 97 mEq/L (ref 96–112)
Creatinine, Ser: 1.52 mg/dL — ABNORMAL HIGH (ref 0.40–1.50)
GFR: 46 mL/min — ABNORMAL LOW (ref >60.00)
Glucose, Bld: 104 mg/dL — ABNORMAL HIGH (ref 70–99)
Phosphorus: 4.7 mg/dL — ABNORMAL HIGH (ref 2.3–4.6)
Potassium: 3.8 mEq/L (ref 3.5–5.1)
Sodium: 139 mEq/L (ref 135–145)

## 2022-03-27 LAB — CBC WITH DIFFERENTIAL/PLATELET
Basophils Absolute: 0.1 10*3/uL (ref 0.0–0.1)
Basophils Relative: 0.8 % (ref 0.0–3.0)
Eosinophils Absolute: 0.4 10*3/uL (ref 0.0–0.7)
Eosinophils Relative: 4.2 % (ref 0.0–5.0)
HCT: 29.7 % — ABNORMAL LOW (ref 39.0–52.0)
Hemoglobin: 10.2 g/dL — ABNORMAL LOW (ref 13.0–17.0)
Lymphocytes Relative: 14.8 % (ref 12.0–46.0)
Lymphs Abs: 1.5 10*3/uL (ref 0.7–4.0)
MCHC: 34.3 g/dL (ref 30.0–36.0)
MCV: 89.7 fl (ref 78.0–100.0)
Monocytes Absolute: 0.8 10*3/uL (ref 0.1–1.0)
Monocytes Relative: 7.8 % (ref 3.0–12.0)
Neutro Abs: 7.4 10*3/uL (ref 1.4–7.7)
Neutrophils Relative %: 72.4 % (ref 43.0–77.0)
Platelets: 323 10*3/uL (ref 150.0–400.0)
RBC: 3.32 Mil/uL — ABNORMAL LOW (ref 4.22–5.81)
RDW: 13.3 % (ref 11.5–15.5)
WBC: 10.3 10*3/uL (ref 4.0–10.5)

## 2022-03-27 NOTE — Assessment & Plan Note (Signed)
Continues metformin '500mg'$  BID.  Has not been able to take farxiga due to cost. Our pharmacy team has been unable to reach him to review possible PAP. Advised to keep phone on him at all times

## 2022-03-27 NOTE — Telephone Encounter (Signed)
Noted. Plz fax over updated medication list so they can review with patient when they go out to see him.

## 2022-03-27 NOTE — Assessment & Plan Note (Signed)
Incidental finding on recent imaging.

## 2022-03-27 NOTE — Telephone Encounter (Signed)
Lvm for [address], of [facility], to call back.  Need to get a fax # to send pt's current med list.

## 2022-03-27 NOTE — Assessment & Plan Note (Addendum)
Medication induced.  Update labs.  Medications reviewed, med list updated.  Will ask HH to assist with medication management given confusion over meds.

## 2022-03-27 NOTE — Assessment & Plan Note (Addendum)
Significant hypotension led to first hospitalization. He's continued taking meds as previously prescribed, BP today well controlled Advised stop metolazone, valsartan, and continue clonidine and carvedilol and furosemide '40mg'$  daily.

## 2022-03-27 NOTE — Assessment & Plan Note (Signed)
Urgent [place] referral placed 03/05/2022. HH has still not come out to see patient. Will ask referral coordinator to expedite evaluation. He needs wound care at home as well as assistance with medication management.

## 2022-03-27 NOTE — Assessment & Plan Note (Addendum)
Significant improvement noted in venous ulcers.  Recent ABIs reassuring. Place in bilateral unna boots today, advised try to keep on for next few days.  Will refer to home health SN for wound care at home, see if she can continue Unna boots until wound clinic appt next week.

## 2022-03-27 NOTE — Assessment & Plan Note (Signed)
Incidental finding on recent imaging.  Continue aspirin, full dose atorvastatin.

## 2022-03-28 NOTE — Telephone Encounter (Signed)
Colletta Maryland returned your call with fax number: [phone]

## 2022-03-28 NOTE — Telephone Encounter (Signed)
Faxed pt's current med list to [facility] at [phone], attn:  Colletta Maryland.   Attempted several times to fax pt's info to fax # above.  Fax keeps failing, message states 'busy'.  Will try again Monday.

## 2022-03-31 NOTE — Telephone Encounter (Signed)
Stephanie from [facility] called in with new fax num # [phone] any question call # [phone]

## 2022-03-31 NOTE — Telephone Encounter (Signed)
Again faxed pt's info several times.  Keep received Fax Failed stating 'Busy'.   Lvm asking Colletta Maryland to call back.  Need another fax # to fax pt's info.

## 2022-03-31 NOTE — Telephone Encounter (Signed)
Noted.  Faxed pt's med list to new fax #:  [phone].

## 2022-04-01 ENCOUNTER — Ambulatory Visit: Payer: Self-pay | Admitting: Pharmacy Technician

## 2022-04-02 NOTE — Telephone Encounter (Signed)
Called patient to schedule an appointment with Charlene Brooke. No answer; left message.  Charlene Brooke, CPP notified  Marijean Niemann, [place] Clinical Pharmacy Assistant [phone]

## 2022-04-04 ENCOUNTER — Telehealth: Payer: Self-pay

## 2022-04-04 DIAGNOSIS — I890 Lymphedema, not elsewhere classified: Secondary | ICD-10-CM | POA: Diagnosis not present

## 2022-04-04 DIAGNOSIS — G8929 Other chronic pain: Secondary | ICD-10-CM | POA: Diagnosis not present

## 2022-04-04 DIAGNOSIS — I87311 Chronic venous hypertension (idiopathic) with ulcer of right lower extremity: Secondary | ICD-10-CM | POA: Diagnosis not present

## 2022-04-04 DIAGNOSIS — Z792 Long term (current) use of antibiotics: Secondary | ICD-10-CM | POA: Diagnosis not present

## 2022-04-04 DIAGNOSIS — I50812 Chronic right heart failure: Secondary | ICD-10-CM | POA: Diagnosis not present

## 2022-04-04 DIAGNOSIS — L538 Other specified erythematous conditions: Secondary | ICD-10-CM | POA: Diagnosis not present

## 2022-04-04 DIAGNOSIS — E11622 Type 2 diabetes mellitus with other skin ulcer: Secondary | ICD-10-CM | POA: Diagnosis not present

## 2022-04-04 DIAGNOSIS — E1122 Type 2 diabetes mellitus with diabetic chronic kidney disease: Secondary | ICD-10-CM | POA: Diagnosis not present

## 2022-04-04 DIAGNOSIS — S90821A Blister (nonthermal), right foot, initial encounter: Secondary | ICD-10-CM | POA: Diagnosis not present

## 2022-04-04 DIAGNOSIS — R531 Weakness: Secondary | ICD-10-CM | POA: Diagnosis not present

## 2022-04-04 DIAGNOSIS — G4700 Insomnia, unspecified: Secondary | ICD-10-CM | POA: Diagnosis not present

## 2022-04-04 DIAGNOSIS — L03115 Cellulitis of right lower limb: Secondary | ICD-10-CM | POA: Diagnosis not present

## 2022-04-04 DIAGNOSIS — I872 Venous insufficiency (chronic) (peripheral): Secondary | ICD-10-CM | POA: Diagnosis not present

## 2022-04-04 DIAGNOSIS — E1140 Type 2 diabetes mellitus with diabetic neuropathy, unspecified: Secondary | ICD-10-CM | POA: Diagnosis not present

## 2022-04-04 DIAGNOSIS — I130 Hypertensive heart and chronic kidney disease with heart failure and stage 1 through stage 4 chronic kidney disease, or unspecified chronic kidney disease: Secondary | ICD-10-CM | POA: Diagnosis not present

## 2022-04-04 DIAGNOSIS — L97211 Non-pressure chronic ulcer of right calf limited to breakdown of skin: Secondary | ICD-10-CM | POA: Diagnosis not present

## 2022-04-04 DIAGNOSIS — Z4800 Encounter for change or removal of nonsurgical wound dressing: Secondary | ICD-10-CM | POA: Diagnosis not present

## 2022-04-04 DIAGNOSIS — E559 Vitamin D deficiency, unspecified: Secondary | ICD-10-CM | POA: Diagnosis not present

## 2022-04-04 DIAGNOSIS — N189 Chronic kidney disease, unspecified: Secondary | ICD-10-CM | POA: Diagnosis not present

## 2022-04-04 NOTE — Telephone Encounter (Signed)
Attempted to contact pt on home and cell #s.  No answer, no vm.  Need to relay results and get answer to Dr. Synthia Innocent questions.  (See Labs, Result Notes- 03/26/22.)  Labs/Dr. Synthia Innocent msg: Your kidney function remains impaired but is improved from last check.  Potassium levels are normal.  New anemia.  Have you noted any blood in stool or urine?  How are you feeling?  Has HH come out to your house for wound care and assistance with medication management?

## 2022-04-07 NOTE — Telephone Encounter (Signed)
Called patient to schedule an appointment with Charlene Brooke. No answer; left message.   Charlene Brooke, CPP notified   Marijean Niemann, [place] Clinical Pharmacy Assistant [phone]

## 2022-04-07 NOTE — Telephone Encounter (Signed)
Attempted to contact pt on home and cell #s.  No answer, no vm.  Need to relay results and get answer to Dr. Synthia Innocent questions.  (See Labs, Result Notes- 03/26/22.)  Labs/Dr. Synthia Innocent msg: Your kidney function remains impaired but is improved from last check.  Potassium levels are normal.  New anemia.  Have you noted any blood in stool or urine?  How are you feeling?  Has HH come out to your house for wound care and assistance with medication management?

## 2022-04-08 NOTE — Telephone Encounter (Signed)
Attempted to contact pt on home and cell #s.  No answer, no vm.  Need to relay results and get answer to Dr. Synthia Innocent questions.  (See Labs, Result Notes- 03/26/22.)  Mailing a letter after several attempts to contact pt with no response.   Labs/Dr. Synthia Innocent msg: Your kidney function remains impaired but is improved from last check.  Potassium levels are normal.  New anemia.  Have you noted any blood in stool or urine?  How are you feeling?  Has HH come out to your house for wound care and assistance with medication management?

## 2022-04-09 ENCOUNTER — Ambulatory Visit (INDEPENDENT_AMBULATORY_CARE_PROVIDER_SITE_OTHER): Payer: Medicare Other | Admitting: Family Medicine

## 2022-04-09 ENCOUNTER — Encounter: Payer: Self-pay | Admitting: Family Medicine

## 2022-04-09 VITALS — BP 162/82 | HR 73 | Temp 97.4°F | Ht 66.5 in | Wt 281.4 lb

## 2022-04-09 DIAGNOSIS — I10 Essential (primary) hypertension: Secondary | ICD-10-CM | POA: Diagnosis not present

## 2022-04-09 DIAGNOSIS — Z79899 Other long term (current) drug therapy: Secondary | ICD-10-CM | POA: Diagnosis not present

## 2022-04-09 DIAGNOSIS — R413 Other amnesia: Secondary | ICD-10-CM

## 2022-04-09 DIAGNOSIS — D649 Anemia, unspecified: Secondary | ICD-10-CM | POA: Diagnosis not present

## 2022-04-09 DIAGNOSIS — E1169 Type 2 diabetes mellitus with other specified complication: Secondary | ICD-10-CM

## 2022-04-09 DIAGNOSIS — I87333 Chronic venous hypertension (idiopathic) with ulcer and inflammation of bilateral lower extremity: Secondary | ICD-10-CM

## 2022-04-09 DIAGNOSIS — N179 Acute kidney failure, unspecified: Secondary | ICD-10-CM

## 2022-04-09 LAB — RENAL FUNCTION PANEL
Albumin: 3.7 g/dL (ref 3.5–5.2)
BUN: 12 mg/dL (ref 6–23)
CO2: 31 mEq/L (ref 19–32)
Calcium: 9.2 mg/dL (ref 8.4–10.5)
Chloride: 103 mEq/L (ref 96–112)
Creatinine, Ser: 1.14 mg/dL (ref 0.40–1.50)
GFR: 64.95 mL/min (ref >60.00)
Glucose, Bld: 118 mg/dL — ABNORMAL HIGH (ref 70–99)
Phosphorus: 3.4 mg/dL (ref 2.3–4.6)
Potassium: 4.5 mEq/L (ref 3.5–5.1)
Sodium: 141 mEq/L (ref 135–145)

## 2022-04-09 LAB — IBC PANEL
Iron: 40 ug/dL — ABNORMAL LOW (ref 42–165)
Saturation Ratios: 14.1 % — ABNORMAL LOW (ref 20.0–50.0)
TIBC: 284.2 ug/dL (ref 250.0–450.0)
Transferrin: 203 mg/dL — ABNORMAL LOW (ref 212.0–360.0)

## 2022-04-09 LAB — CBC WITH DIFFERENTIAL/PLATELET
Basophils Absolute: 0 10*3/uL (ref 0.0–0.1)
Basophils Relative: 0.5 % (ref 0.0–3.0)
Eosinophils Absolute: 0.4 10*3/uL (ref 0.0–0.7)
Eosinophils Relative: 6.1 % — ABNORMAL HIGH (ref 0.0–5.0)
HCT: 30.4 % — ABNORMAL LOW (ref 39.0–52.0)
Hemoglobin: 10.2 g/dL — ABNORMAL LOW (ref 13.0–17.0)
Lymphocytes Relative: 13.1 % (ref 12.0–46.0)
Lymphs Abs: 1 10*3/uL (ref 0.7–4.0)
MCHC: 33.5 g/dL (ref 30.0–36.0)
MCV: 91.2 fl (ref 78.0–100.0)
Monocytes Absolute: 0.6 10*3/uL (ref 0.1–1.0)
Monocytes Relative: 8.1 % (ref 3.0–12.0)
Neutro Abs: 5.3 10*3/uL (ref 1.4–7.7)
Neutrophils Relative %: 72.2 % (ref 43.0–77.0)
Platelets: 223 10*3/uL (ref 150.0–400.0)
RBC: 3.33 Mil/uL — ABNORMAL LOW (ref 4.22–5.81)
RDW: 14.4 % (ref 11.5–15.5)
WBC: 7.4 10*3/uL (ref 4.0–10.5)

## 2022-04-09 LAB — VITAMIN B12: Vitamin B-12: 314 pg/mL (ref 211–911)

## 2022-04-09 LAB — FOLATE: Folate: 9.7 ng/mL (ref >5.9)

## 2022-04-09 LAB — TSH: TSH: 2.12 u[IU]/mL (ref 0.35–5.50)

## 2022-04-09 LAB — FERRITIN: Ferritin: 42.3 ng/mL (ref 22.0–322.0)

## 2022-04-09 NOTE — Assessment & Plan Note (Addendum)
BP elevated today in setting of significant caffeine and sodium intake.  Advised get home cuff to monitor BP readings at home.  He continues carvedilol and clonidine BID, has continued 1/2 tab lasix daily and 1/2 valsartan daily.  Rx for large adult BP cuff provided today and to check with pharmacy or [facility] about getting one.

## 2022-04-09 NOTE — Assessment & Plan Note (Addendum)
Has glucometer but doesn't know how to use it. Advised bring to next appointment.  Continue metformin BID and will try to restart farxiga.  New cell phone # updated in chart.

## 2022-04-09 NOTE — Assessment & Plan Note (Addendum)
New anemia - update anemia panel today including iron levels and B12.  Denies blood in stool or urine.  UA from hospitalization last month reviewed, normal.  Due for colon cancer screening - new iFOB provided today.

## 2022-04-09 NOTE — Progress Notes (Signed)
Patient ID: Gregory Fisher, male    DOB: 1951-03-06, 72 y.o.   MRN: [id number]  This visit was conducted in person.  BP (!) 162/82   Pulse 73   Temp (!) 97.4 F (36.3 C) (Temporal)   Ht 5' 6.5" (1.689 m)   Wt 281 lb 6 oz (127.6 kg)   SpO2 97%   BMI 44.73 kg/m    CC: 2 wk f/u visit  Subjective:   HPI: Gregory Fisher is a 71 y.o. male presenting on 04/09/2022 for Follow-up (Here for 2 wk f/u.  Pt states BP is probably elevated due to 4- 16 oz cups of coffee this AM, plus [employer] for breakfast and some beef jerky.  Also, wants to tell Dr. Darnell Level he has appt with wound care tomorrow. )   See prior note for details. Last visit placed in bilateral unna boots.   Established with wound care clinic Dr Ladona Horns for venous stasis ulcer, next appt tomorrow. Was recommended to start vitamin C zinc and protein supplementation to optimize wound healing. Planning to have unna boot changed twice weekly through wound clinic.   BP elevated today - in setting of four 16 oz cups of coffee this morning as well as beef jerky and [employer] breakfast. Does not check BP at home.   Doesn't check BP at home. BP cuff Rx written for patient.   Again we were unable to reach him to go over [place] drawn 03/26/2022. Letter mailed yesterday.  New cell phone # updated in chart - this had not been updated.   Notes worsening memory difficulty.  Geriatric Assessment:  Activities of Daily Living:     Bathing- independent    Dressing- independent    Eating- independent    Toileting- independent    Transferring- independent    Continence- independent Overall Assessment:independent  Instrumental Activities of Daily Living:     Transportation- independent    Meal/Food Preparation- independent    Shopping Errands- independent    Housekeeping/Chores- independent    Money Management/Finances- independent    Medication Management- independent    Ability to Use Telephone-independent     Laundry- independent Overall  Assessment: independent  Mental Status Exam: 28/30 (value/max value)     Clock Drawing Score: 4/4           Relevant past medical, surgical, family and social history reviewed and updated as indicated. Interim medical history since our last visit reviewed. Allergies and medications reviewed and updated. Outpatient Medications Prior to Visit  Medication Sig Dispense Refill   Accu-Chek Softclix Lancets lancets Use as instructed to check blood sugar 2 (times) a day 200 each 3   aspirin EC 81 MG tablet Take 81 mg by mouth daily.      atorvastatin (LIPITOR) 80 MG tablet Take 1 tablet (80 mg total) by mouth daily. 90 tablet 3   Blood Glucose Monitoring Suppl (ACCU-CHEK GUIDE ME) w/Device KIT Use as instructed to check blood sugar 2 (times) a day 1 kit 0   carvedilol (COREG) 12.5 MG tablet Take 1 tablet (12.5 mg total) by mouth 2 (two) times daily with a meal. 180 tablet 3   Cholecalciferol (VITAMIN D3) 25 MCG (1000 UT) CAPS Take 2 capsules (2,000 Units total) by mouth daily. 30 capsule    cloNIDine (CATAPRES) 0.1 MG tablet Take 1 tablet (0.1 mg total) by mouth 2 (two) times daily. 180 tablet 3   cyanocobalamin 1000 MCG tablet Take 1,000 mcg by mouth daily.  dapagliflozin propanediol (FARXIGA) 5 MG TABS tablet Take 1 tablet (5 mg total) by mouth daily before breakfast.     furosemide (LASIX) 80 MG tablet Take 0.5 tablets (40 mg total) by mouth daily.     glucose blood (ACCU-CHEK GUIDE) test strip Use as instructed to check blood sugar 2 (times) a day 200 strip 3   LORazepam (ATIVAN) 1 MG tablet TAKE 1 TABLET BY MOUTH EVERY DAY AS NEEDED FOR ANXIETY 40 tablet 0   metFORMIN (GLUCOPHAGE) 500 MG tablet Take 1 tablet (500 mg total) by mouth 2 (two) times daily with a meal. 180 tablet 3   tamsulosin (FLOMAX) 0.4 MG CAPS capsule Take 2 capsules (0.8 mg total) by mouth at bedtime. 180 capsule 3   gabapentin (NEURONTIN) 300 MG capsule Take 1 capsule (300 mg total) by mouth 2 (two) times daily as needed  (leg pain). 60 capsule 1   Naproxen Sodium (ALEVE PO) Take 1 tablet by mouth daily as needed.     valsartan (DIOVAN) 320 MG tablet Take 0.5 tablets (160 mg total) by mouth daily.     cephALEXin (KEFLEX) 500 MG capsule Take 1 capsule (500 mg total) by mouth 4 (four) times daily. (Patient not taking: Reported on 03/26/2022) 28 capsule 0   No facility-administered medications prior to visit.     Per HPI unless specifically indicated in ROS section below Review of Systems  Objective:  BP (!) 162/82   Pulse 73   Temp (!) 97.4 F (36.3 C) (Temporal)   Ht 5' 6.5" (1.689 m)   Wt 281 lb 6 oz (127.6 kg)   SpO2 97%   BMI 44.73 kg/m   Wt Readings from Last 3 Encounters:  04/09/22 281 lb 6 oz (127.6 kg)  03/26/22 279 lb 8 oz (126.8 kg)  03/05/22 279 lb 2 oz (126.6 kg)      Physical Exam Vitals and nursing note reviewed.  Constitutional:      Appearance: Normal appearance. He is obese. He is not ill-appearing.     Comments: Ambulating with cane  Cardiovascular:     Rate and Rhythm: Normal rate and regular rhythm.     Pulses: Normal pulses.     Heart sounds: Normal heart sounds. No murmur heard. Pulmonary:     Effort: Pulmonary effort is normal. No respiratory distress.     Breath sounds: Normal breath sounds. No wheezing, rhonchi or rales.  Musculoskeletal:        General: Swelling present.     Right lower leg: Edema present.     Left lower leg: Edema present.     Comments: Unna boot wraps in place BLE  Skin:    General: Skin is warm and dry.  Neurological:     Mental Status: He is alert.  Psychiatric:        Mood and Affect: Mood normal.        Behavior: Behavior normal.       Results for orders placed or performed in visit on 03/26/22  Renal function panel  Result Value Ref Range   Sodium 139 135 - 145 mEq/L   Potassium 3.8 3.5 - 5.1 mEq/L   Chloride 97 96 - 112 mEq/L   CO2 33 (H) 19 - 32 mEq/L   Albumin 3.8 3.5 - 5.2 g/dL   BUN 34 (H) 6 - 23 mg/dL   Creatinine, Ser  1.52 (H) 0.40 - 1.50 mg/dL   Glucose, Bld 104 (H) 70 - 99 mg/dL   Phosphorus 4.7 (  H) 2.3 - 4.6 mg/dL   GFR 46.00 (L) >60.00 mL/min   Calcium 9.9 8.4 - 10.5 mg/dL  CBC with Differential/Platelet  Result Value Ref Range   WBC 10.3 4.0 - 10.5 K/uL   RBC 3.32 (L) 4.22 - 5.81 Mil/uL   Hemoglobin 10.2 (L) 13.0 - 17.0 g/dL   HCT 29.7 (L) 39.0 - 52.0 %   MCV 89.7 78.0 - 100.0 fl   MCHC 34.3 30.0 - 36.0 g/dL   RDW 13.3 11.5 - 15.5 %   Platelets 323.0 150.0 - 400.0 K/uL   Neutrophils Relative % 72.4 43.0 - 77.0 %   Lymphocytes Relative 14.8 12.0 - 46.0 %   Monocytes Relative 7.8 3.0 - 12.0 %   Eosinophils Relative 4.2 0.0 - 5.0 %   Basophils Relative 0.8 0.0 - 3.0 %   Neutro Abs 7.4 1.4 - 7.7 K/uL   Lymphs Abs 1.5 0.7 - 4.0 K/uL   Monocytes Absolute 0.8 0.1 - 1.0 K/uL   Eosinophils Absolute 0.4 0.0 - 0.7 K/uL   Basophils Absolute 0.1 0.0 - 0.1 K/uL   Lab Results  Component Value Date   VITAMINB12 555 05/09/2014   No results found for: "IRON", "TIBC", "FERRITIN"  Lab Results  Component Value Date   HGBA1C 7.6 (A) 02/28/2022    Assessment & Plan:   Problem List Items Addressed This Visit     HTN (hypertension)    BP elevated today in setting of significant caffeine and sodium intake.  Advised get home cuff to monitor BP readings at home.  He continues carvedilol and clonidine BID, has continued 1/2 tab lasix daily and 1/2 valsartan daily.  Rx for large adult BP cuff provided today and to check with pharmacy or [facility] about getting one.       Relevant Medications   valsartan (DIOVAN) 320 MG tablet   Type 2 diabetes mellitus with other specified complication ([place])    Has glucometer but doesn't know how to use it. Advised bring to next appointment.  Continue metformin BID and will try to restart farxiga.  New cell phone # updated in chart.      Relevant Medications   valsartan (DIOVAN) 320 MG tablet   dapagliflozin propanediol (FARXIGA) 5 MG TABS tablet   Chronic venous  hypertension (idiopathic) with ulcer and inflammation of bilateral lower extremity ([place])    Has established with wound clinic - appreciate their care.  Upcoming appt tomorrow with RN to get wraps changed.       Relevant Medications   valsartan (DIOVAN) 320 MG tablet   Polypharmacy   AKI (acute kidney injury) ([place])    Update renal panel.       Relevant Orders   Renal function panel   Memory difficulty    Reassuring evaluation today with MMSE 28/30 as per above       Anemia - Primary    New anemia - update anemia panel today including iron levels and B12.  Denies blood in stool or urine.  UA from hospitalization last month reviewed, normal.  Due for colon cancer screening - new iFOB provided today.       Relevant Orders   CBC with Differential/Platelet   Vitamin B12   Ferritin   IBC panel   Folate   TSH     No orders of the defined types were placed in this encounter.  Orders Placed This Encounter  Procedures   Renal function panel   CBC with Differential/Platelet   Vitamin  B12   Ferritin   IBC panel   Folate   TSH     Patient Instructions  Labs today .  Memory testing today overall normal which is reassuring. Stop gabapentin.  Continue other medicines as per updated med list today.  Try to get home BP cuff at home - prescription written out today.  I will ask pharmacist to reach out about [place].   Follow up plan: Return in about 4 weeks (around 05/07/2022) for follow up visit.  [name], MD

## 2022-04-09 NOTE — Assessment & Plan Note (Signed)
Reassuring evaluation today with MMSE 28/30 as per above

## 2022-04-09 NOTE — Assessment & Plan Note (Signed)
Update renal panel 

## 2022-04-09 NOTE — Patient Instructions (Addendum)
Labs today .  Memory testing today overall normal which is reassuring. Stop gabapentin.  Continue other medicines as per updated med list today.  Try to get home BP cuff at home - prescription written out today.  I will ask pharmacist to reach out about [place].

## 2022-04-09 NOTE — Assessment & Plan Note (Signed)
Has established with wound clinic - appreciate their care.  Upcoming appt tomorrow with RN to get wraps changed.

## 2022-04-09 NOTE — Telephone Encounter (Signed)
New cell phone number updated in chart. Can we try to reach out again? Thanks!

## 2022-04-10 DIAGNOSIS — Z792 Long term (current) use of antibiotics: Secondary | ICD-10-CM | POA: Diagnosis not present

## 2022-04-10 DIAGNOSIS — L03115 Cellulitis of right lower limb: Secondary | ICD-10-CM | POA: Diagnosis not present

## 2022-04-10 DIAGNOSIS — S90821A Blister (nonthermal), right foot, initial encounter: Secondary | ICD-10-CM | POA: Diagnosis not present

## 2022-04-10 DIAGNOSIS — I130 Hypertensive heart and chronic kidney disease with heart failure and stage 1 through stage 4 chronic kidney disease, or unspecified chronic kidney disease: Secondary | ICD-10-CM | POA: Diagnosis not present

## 2022-04-10 DIAGNOSIS — E11622 Type 2 diabetes mellitus with other skin ulcer: Secondary | ICD-10-CM | POA: Diagnosis not present

## 2022-04-10 DIAGNOSIS — R531 Weakness: Secondary | ICD-10-CM | POA: Diagnosis not present

## 2022-04-10 DIAGNOSIS — I890 Lymphedema, not elsewhere classified: Secondary | ICD-10-CM | POA: Diagnosis not present

## 2022-04-10 DIAGNOSIS — I872 Venous insufficiency (chronic) (peripheral): Secondary | ICD-10-CM | POA: Diagnosis not present

## 2022-04-10 DIAGNOSIS — L538 Other specified erythematous conditions: Secondary | ICD-10-CM | POA: Diagnosis not present

## 2022-04-10 DIAGNOSIS — E1122 Type 2 diabetes mellitus with diabetic chronic kidney disease: Secondary | ICD-10-CM | POA: Diagnosis not present

## 2022-04-10 DIAGNOSIS — Z4800 Encounter for change or removal of nonsurgical wound dressing: Secondary | ICD-10-CM | POA: Diagnosis not present

## 2022-04-10 DIAGNOSIS — E1140 Type 2 diabetes mellitus with diabetic neuropathy, unspecified: Secondary | ICD-10-CM | POA: Diagnosis not present

## 2022-04-10 DIAGNOSIS — G8929 Other chronic pain: Secondary | ICD-10-CM | POA: Diagnosis not present

## 2022-04-10 DIAGNOSIS — I50812 Chronic right heart failure: Secondary | ICD-10-CM | POA: Diagnosis not present

## 2022-04-10 DIAGNOSIS — N189 Chronic kidney disease, unspecified: Secondary | ICD-10-CM | POA: Diagnosis not present

## 2022-04-10 DIAGNOSIS — L97211 Non-pressure chronic ulcer of right calf limited to breakdown of skin: Secondary | ICD-10-CM | POA: Diagnosis not present

## 2022-04-10 DIAGNOSIS — E559 Vitamin D deficiency, unspecified: Secondary | ICD-10-CM | POA: Diagnosis not present

## 2022-04-10 DIAGNOSIS — G4700 Insomnia, unspecified: Secondary | ICD-10-CM | POA: Diagnosis not present

## 2022-04-10 DIAGNOSIS — I87311 Chronic venous hypertension (idiopathic) with ulcer of right lower extremity: Secondary | ICD-10-CM | POA: Diagnosis not present

## 2022-04-10 NOTE — Telephone Encounter (Signed)
Patient has been scheduled for a telephone appointment with Charlene Brooke on 04/22/2022 at 9:30.  Charlene Brooke, CPP notified  Marijean Niemann, [place] Clinical Pharmacy Assistant [phone]

## 2022-04-12 ENCOUNTER — Encounter: Payer: Self-pay | Admitting: Family Medicine

## 2022-04-12 DIAGNOSIS — E538 Deficiency of other specified B group vitamins: Secondary | ICD-10-CM | POA: Insufficient documentation

## 2022-04-15 ENCOUNTER — Telehealth: Payer: Self-pay

## 2022-04-15 NOTE — Progress Notes (Signed)
  Chronic Care Management   Note  04/15/2022 Name: WILLET SCHLEIFER MRN: [id number] DOB: 1950-11-08  Garner Nash is a 71 y.o. year old male who is a primary care patient of [name], MD. I reached out to Garner Nash by phone today in response to a referral sent by Mr. Maxamus Colao Rountree's PCP.  Mr. KAIRYN OLMEDA was not successfully contacted today. A HIPAA compliant voice message was left requesting a return call.   Follow up plan: Additional outreach attempts will be made.  Noreene Larsson, [place],  [id number] Direct Dial: [phone] [email or web address]

## 2022-04-17 ENCOUNTER — Telehealth: Payer: Self-pay

## 2022-04-17 NOTE — Progress Notes (Signed)
    Chronic Care Management Pharmacy Assistant   Name: Gregory Fisher  MRN: [id number] DOB: February 23, 1951  Reason for Encounter: CCM (Appointment Reminder)  Medications: Outpatient Encounter Medications as of 04/17/2022  Medication Sig Note   Accu-Chek Softclix Lancets lancets Use as instructed to check blood sugar 2 (times) a day    aspirin EC 81 MG tablet Take 81 mg by mouth daily.  08/29/2015: Does not take on a regular basis    atorvastatin (LIPITOR) 80 MG tablet Take 1 tablet (80 mg total) by mouth daily.    Blood Glucose Monitoring Suppl (ACCU-CHEK GUIDE ME) w/Device KIT Use as instructed to check blood sugar 2 (times) a day    carvedilol (COREG) 12.5 MG tablet Take 1 tablet (12.5 mg total) by mouth 2 (two) times daily with a meal.    Cholecalciferol (VITAMIN D3) 25 MCG (1000 UT) CAPS Take 2 capsules (2,000 Units total) by mouth daily.    cloNIDine (CATAPRES) 0.1 MG tablet Take 1 tablet (0.1 mg total) by mouth 2 (two) times daily.    cyanocobalamin 1000 MCG tablet Take 1,000 mcg by mouth daily.    dapagliflozin propanediol (FARXIGA) 5 MG TABS tablet Take 1 tablet (5 mg total) by mouth daily before breakfast.    furosemide (LASIX) 80 MG tablet Take 0.5 tablets (40 mg total) by mouth daily.    glucose blood (ACCU-CHEK GUIDE) test strip Use as instructed to check blood sugar 2 (times) a day    LORazepam (ATIVAN) 1 MG tablet TAKE 1 TABLET BY MOUTH EVERY DAY AS NEEDED FOR ANXIETY    metFORMIN (GLUCOPHAGE) 500 MG tablet Take 1 tablet (500 mg total) by mouth 2 (two) times daily with a meal.    tamsulosin (FLOMAX) 0.4 MG CAPS capsule Take 2 capsules (0.8 mg total) by mouth at bedtime.    valsartan (DIOVAN) 320 MG tablet Take 0.5 tablets (160 mg total) by mouth daily.    No facility-administered encounter medications on file as of 04/17/2022.   Gregory Fisher was contacted to remind of upcoming telephone visit with Charlene Brooke on 04/22/2022 at 9:30. Patient was reminded to have any blood  glucose and blood pressure readings available for review at appointment.   Message was left reminding patient of appointment.  CCM referral has been placed prior to visit?  Yes   Star Rating Drugs: Medication:  Last Fill: Day Supply Atorvastatin 80 mg 03/31/22 100 Metformin 500 mg 02/22/22 100 Valsartan 320 mg 02/28/22 [place], CPP notified  Marijean Niemann, [place] Pharmacy Assistant [phone]

## 2022-04-18 DIAGNOSIS — Z7984 Long term (current) use of oral hypoglycemic drugs: Secondary | ICD-10-CM | POA: Diagnosis not present

## 2022-04-18 DIAGNOSIS — I130 Hypertensive heart and chronic kidney disease with heart failure and stage 1 through stage 4 chronic kidney disease, or unspecified chronic kidney disease: Secondary | ICD-10-CM | POA: Diagnosis not present

## 2022-04-18 DIAGNOSIS — G8929 Other chronic pain: Secondary | ICD-10-CM | POA: Diagnosis not present

## 2022-04-18 DIAGNOSIS — L97221 Non-pressure chronic ulcer of left calf limited to breakdown of skin: Secondary | ICD-10-CM | POA: Diagnosis not present

## 2022-04-18 DIAGNOSIS — Z87891 Personal history of nicotine dependence: Secondary | ICD-10-CM | POA: Diagnosis not present

## 2022-04-18 DIAGNOSIS — M545 Low back pain, unspecified: Secondary | ICD-10-CM | POA: Diagnosis not present

## 2022-04-18 DIAGNOSIS — E1159 Type 2 diabetes mellitus with other circulatory complications: Secondary | ICD-10-CM | POA: Diagnosis not present

## 2022-04-18 DIAGNOSIS — E1140 Type 2 diabetes mellitus with diabetic neuropathy, unspecified: Secondary | ICD-10-CM | POA: Diagnosis not present

## 2022-04-18 DIAGNOSIS — E1122 Type 2 diabetes mellitus with diabetic chronic kidney disease: Secondary | ICD-10-CM | POA: Diagnosis not present

## 2022-04-18 DIAGNOSIS — I872 Venous insufficiency (chronic) (peripheral): Secondary | ICD-10-CM | POA: Diagnosis not present

## 2022-04-18 DIAGNOSIS — E785 Hyperlipidemia, unspecified: Secondary | ICD-10-CM | POA: Diagnosis not present

## 2022-04-18 DIAGNOSIS — N189 Chronic kidney disease, unspecified: Secondary | ICD-10-CM | POA: Diagnosis not present

## 2022-04-21 ENCOUNTER — Other Ambulatory Visit (HOSPITAL_COMMUNITY): Payer: Medicare Other

## 2022-04-22 ENCOUNTER — Telehealth: Payer: Self-pay | Admitting: Pharmacist

## 2022-04-22 ENCOUNTER — Ambulatory Visit (INDEPENDENT_AMBULATORY_CARE_PROVIDER_SITE_OTHER): Payer: Medicare Other | Admitting: Pharmacist

## 2022-04-22 DIAGNOSIS — E1140 Type 2 diabetes mellitus with diabetic neuropathy, unspecified: Secondary | ICD-10-CM | POA: Diagnosis not present

## 2022-04-22 DIAGNOSIS — I50812 Chronic right heart failure: Secondary | ICD-10-CM

## 2022-04-22 DIAGNOSIS — E785 Hyperlipidemia, unspecified: Secondary | ICD-10-CM | POA: Diagnosis not present

## 2022-04-22 DIAGNOSIS — Z87891 Personal history of nicotine dependence: Secondary | ICD-10-CM | POA: Diagnosis not present

## 2022-04-22 DIAGNOSIS — L97221 Non-pressure chronic ulcer of left calf limited to breakdown of skin: Secondary | ICD-10-CM | POA: Diagnosis not present

## 2022-04-22 DIAGNOSIS — D649 Anemia, unspecified: Secondary | ICD-10-CM

## 2022-04-22 DIAGNOSIS — M545 Low back pain, unspecified: Secondary | ICD-10-CM | POA: Diagnosis not present

## 2022-04-22 DIAGNOSIS — I872 Venous insufficiency (chronic) (peripheral): Secondary | ICD-10-CM | POA: Diagnosis not present

## 2022-04-22 DIAGNOSIS — E1122 Type 2 diabetes mellitus with diabetic chronic kidney disease: Secondary | ICD-10-CM | POA: Diagnosis not present

## 2022-04-22 DIAGNOSIS — Z7984 Long term (current) use of oral hypoglycemic drugs: Secondary | ICD-10-CM | POA: Diagnosis not present

## 2022-04-22 DIAGNOSIS — I700 Atherosclerosis of aorta: Secondary | ICD-10-CM

## 2022-04-22 DIAGNOSIS — I10 Essential (primary) hypertension: Secondary | ICD-10-CM

## 2022-04-22 DIAGNOSIS — G8929 Other chronic pain: Secondary | ICD-10-CM | POA: Diagnosis not present

## 2022-04-22 DIAGNOSIS — L97211 Non-pressure chronic ulcer of right calf limited to breakdown of skin: Secondary | ICD-10-CM | POA: Diagnosis not present

## 2022-04-22 DIAGNOSIS — I130 Hypertensive heart and chronic kidney disease with heart failure and stage 1 through stage 4 chronic kidney disease, or unspecified chronic kidney disease: Secondary | ICD-10-CM | POA: Diagnosis not present

## 2022-04-22 DIAGNOSIS — E1159 Type 2 diabetes mellitus with other circulatory complications: Secondary | ICD-10-CM | POA: Diagnosis not present

## 2022-04-22 DIAGNOSIS — E1169 Type 2 diabetes mellitus with other specified complication: Secondary | ICD-10-CM

## 2022-04-22 DIAGNOSIS — N189 Chronic kidney disease, unspecified: Secondary | ICD-10-CM | POA: Diagnosis not present

## 2022-04-22 NOTE — Telephone Encounter (Signed)
Called patient to reschedule his missed telephone visit with Charlene Brooke. No answer; left message  Charlene Brooke, CPP notified  Marijean Niemann, [place] Clinical Pharmacy Assistant [phone]

## 2022-04-22 NOTE — Telephone Encounter (Signed)
Patient returned call. See CCM encounter 

## 2022-04-22 NOTE — Progress Notes (Signed)
Chronic Care Management Pharmacy Note  04/22/2022 Name:  Gregory Fisher MRN:  [id number] DOB:  January 23, 1951  Summary: -HF: pt is taking only Lasix and off other diuretics; he is drinking up to 64 oz of coffee daily, discussed dangers of excess caffeine; he does not have a BP monitor at home -DM: A1c 7.6% (02/2022), he is on only metformin, off Farxiga due to cost; he does not know how to use glucometer, offered in-person appt with PharmD for training but he declined -Anemia - pt has not started iron supplment  Recommendations/Changes made from today's visit: -Enrolled in [place] for [place] assistance -Advised to reduce coffee consumption, or at least switch to decaf after 1st cup -Advised to buy a BP monitor and check daily; contact PCP with readings consistently > 140/90 -Advised to start ferrous sulfate 350 mg QOD as directed by PCP  Plan: -Pharmacist follow up [place] scheduled for 05/09/22 (after PCP appt) -PCP appt 05/09/22; ECHO 11/20    Subjective: Gregory Fisher is an 71 y.o. year old male who is a primary patient of [name], MD.  The CCM team was consulted for assistance with disease management and care coordination needs.    Engaged with patient by telephone for initial visit in response to provider referral for pharmacy case management and/or care coordination services.   Consent to Services:  The patient was given the following information about Chronic Care Management services today, agreed to services, and gave verbal consent: 1. CCM service includes personalized support from designated clinical staff supervised by the primary care provider, including individualized plan of care and coordination with other care providers 2. 24/7 contact phone numbers for assistance for urgent and routine care needs. 3. Service will only be billed when office clinical staff spend 20 minutes or more in a month to coordinate care. 4. Only one practitioner may furnish and bill the service  in a calendar month. 5.The patient may stop CCM services at any time (effective at the end of the month) by phone call to the office staff. 6. The patient will be responsible for cost sharing (co-pay) of up to 20% of the service fee (after annual deductible is met). Patient agreed to services and consent obtained.  Patient Care Team: [name], MD as PCP - General (Family Medicine) Earnie Larsson, MD as Consulting Physician (Neurosurgery) Carolan Clines, MD (Inactive) as Consulting Physician (Urology) Charlton Haws, [facility] as Pharmacist (Pharmacist)  Recent office visits: 04/09/22 Dr Danise Mina OV: BP elevated in s/o caffeine/sodium intake; rx'd large adult BP cuff; advised to bring glucometer to next appt (doesn't know how to use); new anemia - start oral iron QOD, return stool kit.  03/26/22 Dr Danise Mina OV: hospital f/u - AKI, hypotension d/t hypovolemia, held diuretics. He did continue taking all meds (did not hold as instructed); advised to stop metolazone, valartan; continue clonidine, carvedilol, lasix. Urgent HH referral - wound care and med mgmt.  03/05/22 Dr Danise Mina OV: wound check - ordered [facility] for wound. Also wound clinic. CCM consult pending for Wilder Glade, transportation.  02/28/22 Dr Danise Mina OV: BP elevated, rx valsartan, metolazone; stop HCTZ.Marland Kitchen Rx Keflex for leg wound. Did not start [place] - cost. Referred to CCM.  11/20/21 Dr Danise Mina OV: cellulitis of leg - rx doxycycline.  Recent consult visits: 04/04/22 Dr Ladona Horns (Wound center): venous ulcer, lymphedema.   Hospital visits: 03/24/22 ED visit [facility]): hemoptysis, acute pancreatitis, bronchitis  03/17/22 ED visit ([place]): dehydration, hypotension. Held diuretics, BP meds. Continue to hold diuretics at  home.   Objective:  Lab Results  Component Value Date   CREATININE 1.14 04/09/2022   BUN 12 04/09/2022   GFR 64.95 04/09/2022   GFRNONAA >60 11/21/2016   GFRAA >60 11/21/2016   NA 141 04/09/2022    K 4.5 04/09/2022   CALCIUM 9.2 04/09/2022   CO2 31 04/09/2022   GLUCOSE 118 (H) 04/09/2022    Lab Results  Component Value Date/Time   HGBA1C 7.6 (A) 02/28/2022 08:11 AM   HGBA1C 7.0 (A) 11/20/2021 11:05 AM   HGBA1C 6.3 05/12/2019 12:14 PM   HGBA1C 6.0 04/30/2018 09:12 AM   GFR 64.95 04/09/2022 01:41 PM   GFR 46.00 (L) 03/26/2022 04:25 PM   MICROALBUR <0.7 08/19/2021 10:17 AM   MICROALBUR 0.9 07/26/2014 08:24 AM    Last diabetic Eye exam:  Lab Results  Component Value Date/Time   HMDIABEYEEXA No Retinopathy 08/12/2018 12:00 AM   HMDIABEYEEXA No Retinopathy 08/12/2018 12:00 AM    Last diabetic Foot exam: No results found for: "HMDIABFOOTEX"   Lab Results  Component Value Date   CHOL 146 08/19/2021   HDL 42.10 08/19/2021   LDLCALC 80 08/19/2021   LDLDIRECT 69.8 05/09/2014   TRIG 120.0 08/19/2021   CHOLHDL 3 08/19/2021       Latest Ref Rng & Units 04/09/2022    1:41 PM 03/26/2022    4:25 PM 03/05/2022   11:25 AM  Hepatic Function  Total Protein 6.0 - 8.3 g/dL   7.6   Albumin 3.5 - 5.2 g/dL 3.7  3.8  3.7   AST 0 - 37 U/L   15   ALT 0 - 53 U/L   16   Alk Phosphatase 39 - 117 U/L   82   Total Bilirubin 0.2 - 1.2 mg/dL   0.7     Lab Results  Component Value Date/Time   TSH 2.12 04/09/2022 01:41 PM   TSH 2.12 08/19/2021 10:17 AM       Latest Ref Rng & Units 04/09/2022    1:41 PM 03/26/2022    4:25 PM 03/05/2022   11:25 AM  CBC  WBC 4.0 - 10.5 K/uL 7.4  10.3  10.8   Hemoglobin 13.0 - 17.0 g/dL 10.2  10.2  13.0   Hematocrit 39.0 - 52.0 % 30.4  29.7  37.7   Platelets 150.0 - 400.0 K/uL 223.0  323.0  312.0    Iron/TIBC/Ferritin/ %Sat    Component Value Date/Time   IRON 40 (L) 04/09/2022 1341   TIBC 284.2 04/09/2022 1341   FERRITIN 42.3 04/09/2022 1341   IRONPCTSAT 14.1 (L) 04/09/2022 1341    Lab Results  Component Value Date/Time   VD25OH 37.22 08/19/2021 10:17 AM   VD25OH 51.96 05/12/2019 12:14 PM   VITAMINB12 314 04/09/2022 01:41 PM   VITAMINB12  555 05/09/2014 10:24 AM    Clinical ASCVD: Yes  The 10-year ASCVD risk score (Arnett DK, et al., 2019) is: 39.4%   Values used to calculate the score:     Age: 71 years     Sex: Male     Is Non-Hispanic African American: No     Diabetic: Yes     Tobacco smoker: No     Systolic Blood Pressure: 932 mmHg     Is BP treated: Yes     HDL Cholesterol: 42.1 mg/dL     Total Cholesterol: 146 mg/dL       08/19/2021    9:24 AM 07/05/2019    9:48 AM 04/30/2018    9:12  AM  Depression screen PHQ 2/9  Decreased Interest 0 0 1  Down, Depressed, Hopeless 0 0 1  PHQ - 2 Score 0 0 2  Altered sleeping  0 2  Tired, decreased energy  0 2  Change in appetite  0 2  Feeling bad or failure about yourself   0 1  Trouble concentrating  0 0  Moving slowly or fidgety/restless  0 0  Suicidal thoughts  0 0  PHQ-9 Score  0 9  Difficult doing work/chores  Not difficult at all Not difficult at all    Social History   Tobacco Use  Smoking Status Never  Smokeless Tobacco Never   BP Readings from Last 3 Encounters:  04/09/22 (!) 162/82  03/26/22 134/70  03/05/22 120/66   Pulse Readings from Last 3 Encounters:  04/09/22 73  03/26/22 94  03/05/22 (!) 102   Wt Readings from Last 3 Encounters:  04/09/22 281 lb 6 oz (127.6 kg)  03/26/22 279 lb 8 oz (126.8 kg)  03/05/22 279 lb 2 oz (126.6 kg)   BMI Readings from Last 3 Encounters:  04/09/22 44.73 kg/m  03/26/22 44.44 kg/m  03/05/22 44.38 kg/m    Assessment/Interventions: Review of patient past medical history, allergies, medications, health status, including review of consultants reports, laboratory and other test data, was performed as part of comprehensive evaluation and provision of chronic care management services.   SDOH:  (Social Determinants of Health) assessments and interventions performed: Yes SDOH Interventions    Flowsheet Row Chronic Care Management from 04/22/2022 in [place] at [place] from  07/05/2019 in [place] at [place] from 04/30/2018 in [place] at [place] from 04/23/2017 in [place] at [place] from 04/18/2016 in [place] at [place] Interventions       Transportation Interventions Intervention Not Indicated -- -- -- --  Depression Interventions/Treatment  -- NOM7-6 Score <4 Follow-up Not Indicated --  [referral to PCP] --  [referral to PCP] --  [pt will be referred to PCP for further evaluation]  Financial Strain Interventions Other (Comment)  [Farxiga PAP - enrolled] -- -- -- --      SDOH Screenings   Food Insecurity: No Food Insecurity (07/05/2019)  Housing: Low Risk  (07/05/2019)  Transportation Needs: No Transportation Needs (04/30/2022)  Alcohol Screen: Low Risk  (07/05/2019)  Depression (PHQ2-9): Low Risk  (08/19/2021)  Financial Resource Strain: Medium Risk (04/30/2022)  Physical Activity: Inactive (07/05/2019)  Stress: No Stress Concern Present (07/05/2019)  Tobacco Use: Low Risk  (04/09/2022)    [place]  No Known Allergies  Medications Reviewed Today     Reviewed by Charlton Haws, [facility] (Pharmacist) on 04/30/22 at [place] List Status: <None>   Medication Order Taking? Sig Documenting Provider Last Dose Status Informant  Accu-Chek Softclix Lancets lancets [id number] Yes Use as instructed to check blood sugar 2 (times) a day [name], MD Taking Active   aspirin EC 81 MG tablet [id number] Yes Take 81 mg by mouth daily.  [provider] Taking Active Self           Med Note Fredderick Phenix, MEGAN L   Wed Aug 29, 2015  9:18 AM) Does not take on a regular basis   atorvastatin (LIPITOR) 80 MG tablet [id number] Yes Take 1 tablet (80 mg total) by mouth daily. [name], MD Taking Active   Blood Glucose Monitoring Suppl ([place]) w/Device  KIT [id number] Yes Use as instructed to check blood sugar 2 (times) a day [name], MD Taking Active   carvedilol (COREG) 12.5 MG tablet [id number] Yes Take 1 tablet (12.5 mg total) by mouth 2 (two) times daily with a meal. [name], MD Taking Active   Cholecalciferol (VITAMIN D3) 25 MCG (1000 UT) CAPS [id number] Yes Take 2 capsules (2,000 Units total) by mouth daily. [name], MD Taking Active   cloNIDine (CATAPRES) 0.1 MG tablet [id number] Yes Take 1 tablet (0.1 mg total) by mouth 2 (two) times daily. [name], MD Taking Active   cyanocobalamin 1000 MCG tablet [id number] Yes Take 1,000 mcg by mouth daily. [provider] Taking Active   dapagliflozin propanediol (FARXIGA) 5 MG TABS tablet [id number] No Take 1 tablet (5 mg total) by mouth daily before breakfast.  Patient not taking: Reported on 04/30/2022   [name], MD Not Taking Active   furosemide (LASIX) 40 MG tablet [id number] Yes Take 40 mg by mouth. [provider] Taking Active   gabapentin (NEURONTIN) 300 MG capsule [id number] Yes Take 300 mg by mouth 2 (two) times daily. [provider] Taking Active   glucose blood (ACCU-CHEK GUIDE) test strip [id number] Yes Use as instructed to check blood sugar 2 (times) a day [name], MD Taking Active   LORazepam (ATIVAN) 1 MG tablet [id number] Yes TAKE 1 TABLET BY MOUTH EVERY DAY AS NEEDED FOR ANXIETY [name], MD Taking Active   metFORMIN (GLUCOPHAGE) 500 MG tablet [id number] Yes TAKE 1 TABLET BY MOUTH TWICE  DAILY WITH MEALS [name], MD Taking Active   tamsulosin [facility] 0.4 MG CAPS capsule [id number] Yes Take 2 capsules (0.8 mg total) by mouth at bedtime. [name], MD Taking Active   valsartan (DIOVAN) 320 MG tablet [id number] Yes Take 0.5 tablets (160 mg total) by mouth daily. 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   Diverticulosis 03/27/2022   Chronic venous hypertension (idiopathic) with ulcer and inflammation of bilateral lower extremity ([place]) 03/06/2022   Dyspnea 03/01/2022   Transportation insecurity 11/21/2021   Cellulitis of right leg without foot 11/20/2021   Adrenal adenoma, left 09/29/2021   Chronic right heart failure ([place]) 10/12/2019   Fatty liver 96/78/9381   Umbilical hernia 01/75/1025   Postprandial RUQ pain 06/23/2017   Primary osteoarthritis of right knee 02/06/2017   Medicare annual wellness visit, subsequent 04/17/2015   Advanced care planning/counseling discussion 04/17/2015   Pruritic condition 08/01/2014   Obesity, morbid, BMI 40.0-49.9 ([place]) 08/01/2014   Type 2 diabetes mellitus with other specified complication ([place]) 85/27/7824   Insomnia 12/12/2013   Right shoulder pain 07/25/2013   Mood disorder ([place]) 03/14/2013   Healthcare maintenance 01/19/2013   HTN (hypertension)    Hyperlipidemia associated with type 2 diabetes mellitus ([place])    Vitamin D deficiency    Osteopenia    Ascending aorta dilation (HCC)    Benign prostatic hyperplasia    Carotid stenosis    Left rotator cuff tear     Immunization History  Administered Date(s) Administered   Fluad Quad(high Dose 65+) 02/28/2019, 06/06/2020, 02/28/2022  Influenza,inj,Quad PF,6+ Mos 03/08/2013, 05/09/2014, 04/17/2015, 04/18/2016, 04/23/2017, 04/14/2018   Influenza-Unspecified 04/03/2021   PFIZER(Purple Top)SARS-COV-2 Vaccination 09/05/2019, 09/26/2019, 05/28/2020   Pneumococcal Conjugate-13 04/23/2016   Pneumococcal Polysaccharide-23 04/23/2017   Tdap 01/19/2013    Conditions to be addressed/monitored:  Hypertension, Hyperlipidemia, Diabetes, Heart Failure, BPH, and Anemia  Care Plan : CCM Pharmacy care plan  Updates made  by Charlton Haws, [place] since 04/30/2022 12:00 AM     Problem: Hypertension, Hyperlipidemia, Diabetes, Heart Failure, BPH, and Anemia      Long-Range Goal: Disease mgmt   Start Date: 04/30/2022  Expected End Date: 05/01/2023  This Visit's Progress: On track  Priority: High  Note:   Current Barriers:  Unable to independently afford treatment regimen Unable to maintain control of DM, HF Does not maintain contact with provider office  Pharmacist Clinical Goal(s):  Patient will verbalize ability to afford treatment regimen adhere to plan to optimize therapeutic regimen for DM, HF as evidenced by report of adherence to recommended medication management changes contact provider office for questions/concerns as evidenced notation of same in electronic health record through collaboration with PharmD and provider.   Interventions: 1:1 collaboration with [name], MD regarding development and update of comprehensive plan of care as evidenced by provider attestation and co-signature Inter-disciplinary care team collaboration (see longitudinal plan of care) Comprehensive medication review performed; medication list updated in electronic medical record  Hyperlipidemia: (LDL goal < 70) -Not ideally controlled - LDL 80 (08/2021) -Current treatment: Atorvastatin 80 mg daily - Appropriate, Query Effective -Medications previously tried: n/a  -Educated on Cholesterol goals;  -Recommended to continue current medication; consider addition of ezetimibe  Diabetes (A1c goal <7%) -Not ideally controlled - A1c 7.6% (02/2022); pt has been off of [place] due to cost -Query history of pancreatitis (asymptomatic acute pancreatitis noted on [facility] from [facility] ED 03/24/22) -Current home glucose readings: n/a (cannot use testing supplies) -Current medications: Metformin 500 mg BID - Appropriate, Query Effective Farxiga 5 mg daily (not taking) - inaccessible Testing supplies (unsure how to  use) -Medications previously tried: Ozempic (unable to self-inject)  -Current meal patterns:  breakfast: 2 eggs  -Educated on A1c and blood sugar goals; Complications of diabetes including kidney damage, retinal damage, and cardiovascular disease; -Discussed glucometer, offered appt with PharmD for training but pt would rather just bring to next PCP appt 12/1 -[place] patient in [place] for [place] -Recommended to continue current medication; restart Farxiga  Heart Failure / Hypertension (Goal: BP < 130/80) -Query controlled - seen in ED last month for dehydration/hypotension in setting of diuretic use, he was meant to hold all diuretics until PCP f/u but he did not, now he is off HCTZ and metolazone and taking only Lasix;  -Drinks 3-4 16-oz cups of coffee per day -Last ejection fraction: 50-55% (Date: 04/2020) -HF type: HFpEF (EF > 50%); right ventricular failure -NYHA Class: I-II; AHA HF Stage: C (Heart disease and symptoms present) -Current home BP/HR readings: none - does not have BP cuff -Current treatment: Carvedilol 12.5 mg BID - Appropriate, Effective, Safe, Accessible Clonidine 0.1 mg BID -Appropriate, Effective, Safe, Accessible Valsartan 320 mg - 1/2 tab daily -Appropriate, Effective, Safe, Accessible Furosemide 40 mg daily -Appropriate, Effective, Safe, Accessible [place]-Con M10 BID -Appropriate, Effective, Safe, Accessible Farxiga 5 mg daily - not taking (cost) -Medications previously tried: metolazone, HCTZ  -Educated on Benefits of medications for managing symptoms and prolonging life Importance of weighing daily; if you gain more than 3 pounds in one day or 5 pounds in  one week, contact provider -Advised to reduce coffee consumption, at least switch to decaf after 1st cup -Advised to buy a BP monitor OTC and monitor daily -Recommended to continue current medication; start Farxiga (as above)  BPH (Goal: improve urination) -Controlled -Current treatment: Tamsulosin 0.4 mg  - 2 cap daily - Appropriate, Effective, Safe, Accessible -Medications previously tried: n/a -Recommended to continue current medication  Burning pain (Goal: manage symptoms) -Controlled -Current treatment  Gabapentin 300 mg BID -Medications previously tried: n/a  -Recommended to continue current medication  Anemia  -Uncontrolled - he has not started iron as directed -Current treatment  Vitamin B12 1000 mcg daily - just started Ferrous sulfate 325 mg QOD - not taking -Medications previously tried: n/a  -Discussed role of iron and advised to start supplements as directed by PCP  Health Maintenance -Vaccine gaps: Shingrix -Taking cipro for leg wounds since last week (from wound clinic)  Patient Goals/Self-Care Activities Patient will:  - take medications as prescribed as evidenced by patient report and record review focus on medication adherence by routine       Medication Assistance:  Wilder Glade - [place]and[place] approved through 06/09/23  Compliance/Adherence/Medication fill history: Care Gaps: FOBT (ordered, pt needs to return) Eye exam (due 08/12/19)  Star-Rating Drugs: Atorvastatin - PDC 100% Metformin - PDC 100% Valsartan - PDC 100%  Medication Access: Within the past 30 days, how often has patient missed a dose of medication? Unable to assess Is a pillbox or other method used to improve adherence? Yes  Factors that may affect medication adherence? financial need and lack of understanding of disease management Are meds synced by current pharmacy? No  Are meds delivered by current pharmacy? Yes  Does patient experience delays in picking up medications due to transportation concerns? No   [facility] Reviewed: Is patient disadvantaged to use [facility]?: Yes  Current Rx insurance plan: [facility] Name and location of [facility]  [id number] [place] [address]Phone:  [phone]: [address] [address]Phone: [phone]: [phone] [facility] services reviewed with patient today?: No  Patient requests to transfer care to [facility]?: No  Reason patient declined to change pharmacies: Disadvantaged due to insurance/mail order    Care Plan and Follow Up Patient Decision:  Patient agrees to Care Plan and Follow-up.  Plan: Face to Face appointment with care management team member scheduled for: 3 weeks  LCharlene Brooke PharmD, BCACP Clinical Pharmacist [phone]

## 2022-04-22 NOTE — Telephone Encounter (Signed)
  Chronic Care Management   Outreach Note  04/22/2022 Name: Gregory Fisher MRN: [id number] DOB: [birth year]  Referred by: [name], MD  Patient had a phone appointment scheduled with clinical pharmacist today.  An unsuccessful telephone outreach was attempted today. The patient was referred to the pharmacist for assistance with medications, care management and care coordination.   Patient will NOT be penalized in any way for missing a CCM appointment. The no-show fee does not apply.  If possible, a message was left to return call to: [phone] or to [facility].  Charlene Brooke, PharmD, BCACP Clinical Pharmacist [place] [facility] [phone]

## 2022-04-28 ENCOUNTER — Ambulatory Visit (HOSPITAL_COMMUNITY): Admission: RE | Admit: 2022-04-28 | Payer: Medicare Other | Source: Ambulatory Visit

## 2022-04-28 NOTE — Progress Notes (Signed)
  Chronic Care Management   Note  04/28/2022 Name: Gregory Fisher MRN: [id number] DOB: 1950-07-12  [name] is a 71 y.o. year old male who is a primary care patient of [name], MD. I reached out to Garner Nash by phone today in response to a referral sent by [name]'s PCP.  [name]  agreedto scheduling an appointment with the CCM RN Case Manager   Follow up plan: Patient agreed to scheduled appointment with RN Case Manager on 05/08/2022(date/time).   Noreene Larsson, [place], [place] [address] Direct Dial: [phone] [email or web address]

## 2022-04-29 ENCOUNTER — Other Ambulatory Visit: Payer: Self-pay | Admitting: Family Medicine

## 2022-04-30 ENCOUNTER — Telehealth: Payer: Self-pay | Admitting: Pharmacist

## 2022-04-30 MED ORDER — DAPAGLIFLOZIN PROPANEDIOL 5 MG PO TABS: 5.0000 mg | ORAL_TABLET | Freq: Every day | ORAL | 3 refills | Status: DC

## 2022-04-30 NOTE — Telephone Encounter (Signed)
ERx to pharmacy #[id number], RF3. Thank you!

## 2022-04-30 NOTE — Patient Instructions (Signed)
Visit Information  Phone number for Pharmacist: [phone]   Goals Addressed   None     Care Plan : [place] care plan  Updates made by Charlton Haws, RPH since 04/30/2022 12:00 AM     Problem: Hypertension, Hyperlipidemia, Diabetes, Heart Failure, BPH, and Anemia      Long-Range Goal: Disease mgmt   Start Date: 04/30/2022  Expected End Date: 05/01/2023  This Visit's Progress: On track  Priority: High  Note:   Current Barriers:  Unable to independently afford treatment regimen Unable to maintain control of DM, HF Does not maintain contact with provider office  Pharmacist Clinical Goal(s):  Patient will verbalize ability to afford treatment regimen adhere to plan to optimize therapeutic regimen for DM, HF as evidenced by report of adherence to recommended medication management changes contact provider office for questions/concerns as evidenced notation of same in electronic health record through collaboration with PharmD and provider.   Interventions: 1:1 collaboration with [name], MD regarding development and update of comprehensive plan of care as evidenced by provider attestation and co-signature Inter-disciplinary care team collaboration (see longitudinal plan of care) Comprehensive medication review performed; medication list updated in electronic medical record  Hyperlipidemia: (LDL goal < 70) -Not ideally controlled - LDL 80 (08/2021) -Current treatment: Atorvastatin 80 mg daily - Appropriate, Query Effective -Medications previously tried: n/a  -Educated on Cholesterol goals;  -Recommended to continue current medication; consider addition of ezetimibe  Diabetes (A1c goal <7%) -Not ideally controlled - A1c 7.6% (02/2022); pt has been off of [place] due to cost -Query history of pancreatitis (asymptomatic acute pancreatitis noted on [facility] from [facility] ED 03/24/22) -Current home glucose readings: n/a (cannot use testing supplies) -Current  medications: Metformin 500 mg BID - Appropriate, Query Effective Farxiga 5 mg daily (not taking) - inaccessible Testing supplies (unsure how to use) -Medications previously tried: Ozempic (unable to self-inject)  -Current meal patterns:  breakfast: 2 eggs  -Educated on A1c and blood sugar goals; Complications of diabetes including kidney damage, retinal damage, and cardiovascular disease; -Discussed glucometer, offered appt with PharmD for training but pt would rather just bring to next PCP appt 12/1 -[place] for [place] -Recommended to continue current medication; restart Farxiga  Heart Failure / Hypertension (Goal: BP < 130/80) -Query controlled - seen in ED last month for dehydration/hypotension in setting of diuretic use, he was meant to hold all diuretics until PCP f/u but he did not, now he is off HCTZ and metolazone and taking only Lasix;  -Drinks 3-4 16-oz cups of coffee per day -Last ejection fraction: 50-55% (Date: 04/2020) -HF type: HFpEF (EF > 50%); right ventricular failure -NYHA Class: I-II; AHA HF Stage: C (Heart disease and symptoms present) -Current home BP/HR readings: none - does not have BP cuff -Current treatment: Carvedilol 12.5 mg BID - Appropriate, Effective, Safe, Accessible Clonidine 0.1 mg BID -Appropriate, Effective, Safe, Accessible Valsartan 320 mg - 1/2 tab daily -Appropriate, Effective, Safe, Accessible Furosemide 40 mg daily -Appropriate, Effective, Safe, Accessible Klor-Con M10 BID -Appropriate, Effective, Safe, Accessible Farxiga 5 mg daily - not taking (cost) -Medications previously tried: metolazone, HCTZ  -Educated on Benefits of medications for managing symptoms and prolonging life Importance of weighing daily; if you gain more than 3 pounds in one day or 5 pounds in one week, contact provider -Advised to reduce coffee consumption, at least switch to decaf after 1st cup -Advised to buy a BP monitor OTC and monitor  daily -Recommended to continue current medication; start  [place] (as above)  BPH (Goal: improve urination) -Controlled -Current treatment: Tamsulosin 0.4 mg - 2 cap daily - Appropriate, Effective, Safe, Accessible -Medications previously tried: n/a -Recommended to continue current medication  Burning pain (Goal: manage symptoms) -Controlled -Current treatment  Gabapentin 300 mg BID -Medications previously tried: n/a  -Recommended to continue current medication  Anemia  -Uncontrolled - he has not started iron as directed -Current treatment  Vitamin B12 1000 mcg daily - just started Ferrous sulfate 325 mg QOD - not taking -Medications previously tried: n/a  -Discussed role of iron and advised to start supplements as directed by PCP  Health Maintenance -Vaccine gaps: Shingrix -Taking cipro for leg wounds since last week (from wound clinic)  Patient Goals/Self-Care Activities Patient will:  - take medications as prescribed as evidenced by patient report and record review focus on medication adherence by routine       The patient verbalized understanding of instructions, educational materials, and care plan provided today and DECLINED offer to receive copy of patient instructions, educational materials, and care plan.  Face to Face appointment with pharmacist scheduled for:  05/09/22  Charlene Brooke, PharmD, BCACP Clinical Pharmacist [facility] at [facility] [phone]

## 2022-04-30 NOTE — Telephone Encounter (Signed)
Patient was approved for medication assistance through [place]. To complete enrollment they need eRx for Farxiga 5 mg to:  [facility] - [place], [place] [address] [place] [address] Phone: [phone] Fax: [phone]  Routing to PCP for Rx.

## 2022-05-06 ENCOUNTER — Telehealth: Payer: Self-pay

## 2022-05-06 DIAGNOSIS — L97221 Non-pressure chronic ulcer of left calf limited to breakdown of skin: Secondary | ICD-10-CM | POA: Diagnosis not present

## 2022-05-06 DIAGNOSIS — G8929 Other chronic pain: Secondary | ICD-10-CM | POA: Diagnosis not present

## 2022-05-06 DIAGNOSIS — E1140 Type 2 diabetes mellitus with diabetic neuropathy, unspecified: Secondary | ICD-10-CM | POA: Diagnosis not present

## 2022-05-06 DIAGNOSIS — N189 Chronic kidney disease, unspecified: Secondary | ICD-10-CM | POA: Diagnosis not present

## 2022-05-06 DIAGNOSIS — I130 Hypertensive heart and chronic kidney disease with heart failure and stage 1 through stage 4 chronic kidney disease, or unspecified chronic kidney disease: Secondary | ICD-10-CM | POA: Diagnosis not present

## 2022-05-06 DIAGNOSIS — I872 Venous insufficiency (chronic) (peripheral): Secondary | ICD-10-CM | POA: Diagnosis not present

## 2022-05-06 DIAGNOSIS — E785 Hyperlipidemia, unspecified: Secondary | ICD-10-CM | POA: Diagnosis not present

## 2022-05-06 DIAGNOSIS — Z7984 Long term (current) use of oral hypoglycemic drugs: Secondary | ICD-10-CM | POA: Diagnosis not present

## 2022-05-06 DIAGNOSIS — Z87891 Personal history of nicotine dependence: Secondary | ICD-10-CM | POA: Diagnosis not present

## 2022-05-06 DIAGNOSIS — E1122 Type 2 diabetes mellitus with diabetic chronic kidney disease: Secondary | ICD-10-CM | POA: Diagnosis not present

## 2022-05-06 DIAGNOSIS — E1159 Type 2 diabetes mellitus with other circulatory complications: Secondary | ICD-10-CM | POA: Diagnosis not present

## 2022-05-06 DIAGNOSIS — M545 Low back pain, unspecified: Secondary | ICD-10-CM | POA: Diagnosis not present

## 2022-05-06 NOTE — Progress Notes (Signed)
    Chronic Care Management Pharmacy Assistant   Name: Gregory Fisher  MRN: [id number] DOB: December 30, 1950  Reason for Encounter: CCM (Appointment Reminder)  Medications: Outpatient Encounter Medications as of 05/06/2022  Medication Sig Note   Accu-Chek Softclix Lancets lancets Use as instructed to check blood sugar 2 (times) a day    aspirin EC 81 MG tablet Take 81 mg by mouth daily.  08/29/2015: Does not take on a regular basis    atorvastatin (LIPITOR) 80 MG tablet Take 1 tablet (80 mg total) by mouth daily.    Blood Glucose Monitoring Suppl (ACCU-CHEK GUIDE ME) w/Device KIT Use as instructed to check blood sugar 2 (times) a day    carvedilol (COREG) 12.5 MG tablet Take 1 tablet (12.5 mg total) by mouth 2 (two) times daily with a meal.    Cholecalciferol (VITAMIN D3) 25 MCG (1000 UT) CAPS Take 2 capsules (2,000 Units total) by mouth daily.    cloNIDine (CATAPRES) 0.1 MG tablet Take 1 tablet (0.1 mg total) by mouth 2 (two) times daily.    cyanocobalamin 1000 MCG tablet Take 1,000 mcg by mouth daily.    dapagliflozin propanediol (FARXIGA) 5 MG TABS tablet Take 1 tablet (5 mg total) by mouth daily before breakfast.    furosemide (LASIX) 40 MG tablet Take 40 mg by mouth.    gabapentin (NEURONTIN) 300 MG capsule Take 300 mg by mouth 2 (two) times daily.    glucose blood (ACCU-CHEK GUIDE) test strip Use as instructed to check blood sugar 2 (times) a day    LORazepam (ATIVAN) 1 MG tablet TAKE 1 TABLET BY MOUTH EVERY DAY AS NEEDED FOR ANXIETY    metFORMIN (GLUCOPHAGE) 500 MG tablet TAKE 1 TABLET BY MOUTH TWICE  DAILY WITH MEALS    tamsulosin (FLOMAX) 0.4 MG CAPS capsule Take 2 capsules (0.8 mg total) by mouth at bedtime.    valsartan (DIOVAN) 320 MG tablet Take 0.5 tablets (160 mg total) by mouth daily.    No facility-administered encounter medications on file as of 05/06/2022.   [name] was contacted to remind of upcoming office visit with Charlene Brooke on 05/09/2022 at 10:00.  Patient was reminded to have any blood glucose and blood pressure readings available for review at appointment.   Message was left reminding patient of appointment.   CCM referral has been placed prior to visit?  Yes    Star Rating Drugs: Medication:                Last Fill:         Day Supply Atorvastatin 80 mg      03/31/22          100 Metformin 500 mg       02/22/22          100 Valsartan 320 mg        02/28/22          [place], CPP notified   Marijean Niemann, [place] Pharmacy Assistant [phone]

## 2022-05-08 ENCOUNTER — Telehealth: Payer: Self-pay

## 2022-05-08 ENCOUNTER — Telehealth: Payer: Medicare Other

## 2022-05-08 DIAGNOSIS — E785 Hyperlipidemia, unspecified: Secondary | ICD-10-CM

## 2022-05-08 DIAGNOSIS — I110 Hypertensive heart disease with heart failure: Secondary | ICD-10-CM | POA: Diagnosis not present

## 2022-05-08 DIAGNOSIS — E1159 Type 2 diabetes mellitus with other circulatory complications: Secondary | ICD-10-CM | POA: Diagnosis not present

## 2022-05-08 DIAGNOSIS — I50810 Right heart failure, unspecified: Secondary | ICD-10-CM

## 2022-05-08 DIAGNOSIS — N400 Enlarged prostate without lower urinary tract symptoms: Secondary | ICD-10-CM

## 2022-05-08 NOTE — Telephone Encounter (Signed)
   CCM RN Visit Note   05/08/22 Name: MALCOLM QUAST MRN: [id number]      DOB: 1950-10-23  Subjective: Gregory Fisher is a 71 y.o. year old male who is a primary care patient of [name], MD. The patient was referred to the Chronic Care Management team for assistance with care management needs subsequent to provider initiation of CCM services and plan of care.      An unsuccessful outreach attempt was made today for a scheduled CCM visit.   Plan: Unable to leave a voice message. Will make additional outreach attempts.   Horris Latino RN Care Manager/Chronic Care Management [phone]

## 2022-05-09 ENCOUNTER — Ambulatory Visit: Payer: Medicare Other | Admitting: Family Medicine

## 2022-05-09 ENCOUNTER — Ambulatory Visit: Payer: Medicare Other

## 2022-05-09 ENCOUNTER — Telehealth: Payer: Self-pay | Admitting: Family Medicine

## 2022-05-09 DIAGNOSIS — E1169 Type 2 diabetes mellitus with other specified complication: Secondary | ICD-10-CM

## 2022-05-09 MED ORDER — DAPAGLIFLOZIN PROPANEDIOL 5 MG PO TABS: 5.0000 mg | ORAL_TABLET | Freq: Every day | ORAL | 3 refills | Status: DC

## 2022-05-09 NOTE — Telephone Encounter (Signed)
  Encourage patient to contact the pharmacy for refills or they can request refills through [facility]:  Please schedule appointment if longer than 1 year  NEXT APPOINTMENT DATE: dapagliflozin propanediol (FARXIGA) 5 MG TABS tablet  MEDICATION:  Is the patient out of medication?   PHARMACY: [id number] [place], [place] - [address]   Let patient know to contact pharmacy at the end of the day to make sure medication is ready.  Please notify patient to allow 48-72 hours to process  CLINICAL FILLS OUT ALL BELOW:   LAST REFILL:  QTY:  REFILL DATE:    OTHER COMMENTS:    Okay for refill?  Please advise

## 2022-05-09 NOTE — Telephone Encounter (Signed)
E-scribed refill to [facility] [place].

## 2022-05-13 ENCOUNTER — Ambulatory Visit (HOSPITAL_COMMUNITY)
Admission: RE | Admit: 2022-05-13 | Discharge: 2022-05-13 | Disposition: A | Discharge status: Home / Self Care | Payer: Medicare Other | Source: Ambulatory Visit | Attending: Family Medicine | Admitting: Family Medicine

## 2022-05-13 DIAGNOSIS — I50812 Chronic right heart failure: Secondary | ICD-10-CM | POA: Insufficient documentation

## 2022-05-13 LAB — ECHOCARDIOGRAM COMPLETE
AR max vel: 2.25 cm2
AV Area VTI: 2.03 cm2
AV Area mean vel: 1.92 cm2
AV Mean grad: 5 mmHg
AV Peak grad: 7.1 mmHg
Ao pk vel: 1.33 m/s
Area-P 1/2: 3.61 cm2
MV VTI: 2.58 cm2
S' Lateral: 3 cm

## 2022-05-13 NOTE — Progress Notes (Signed)
*  PRELIMINARY RESULTS* Echocardiogram 2D Echocardiogram has been performed.  Gregory Fisher 05/13/2022, 4:06 PM

## 2022-05-20 DIAGNOSIS — M545 Low back pain, unspecified: Secondary | ICD-10-CM | POA: Diagnosis not present

## 2022-05-20 DIAGNOSIS — N189 Chronic kidney disease, unspecified: Secondary | ICD-10-CM | POA: Diagnosis not present

## 2022-05-20 DIAGNOSIS — G8929 Other chronic pain: Secondary | ICD-10-CM | POA: Diagnosis not present

## 2022-05-20 DIAGNOSIS — E11622 Type 2 diabetes mellitus with other skin ulcer: Secondary | ICD-10-CM | POA: Diagnosis not present

## 2022-05-20 DIAGNOSIS — Z792 Long term (current) use of antibiotics: Secondary | ICD-10-CM | POA: Diagnosis not present

## 2022-05-20 DIAGNOSIS — I130 Hypertensive heart and chronic kidney disease with heart failure and stage 1 through stage 4 chronic kidney disease, or unspecified chronic kidney disease: Secondary | ICD-10-CM | POA: Diagnosis not present

## 2022-05-20 DIAGNOSIS — E785 Hyperlipidemia, unspecified: Secondary | ICD-10-CM | POA: Diagnosis not present

## 2022-05-20 DIAGNOSIS — I6529 Occlusion and stenosis of unspecified carotid artery: Secondary | ICD-10-CM | POA: Diagnosis not present

## 2022-05-20 DIAGNOSIS — L97211 Non-pressure chronic ulcer of right calf limited to breakdown of skin: Secondary | ICD-10-CM | POA: Diagnosis not present

## 2022-05-20 DIAGNOSIS — Z79899 Other long term (current) drug therapy: Secondary | ICD-10-CM | POA: Diagnosis not present

## 2022-05-20 DIAGNOSIS — I872 Venous insufficiency (chronic) (peripheral): Secondary | ICD-10-CM | POA: Diagnosis not present

## 2022-05-20 DIAGNOSIS — I890 Lymphedema, not elsewhere classified: Secondary | ICD-10-CM | POA: Diagnosis not present

## 2022-05-20 DIAGNOSIS — I878 Other specified disorders of veins: Secondary | ICD-10-CM | POA: Diagnosis not present

## 2022-05-20 DIAGNOSIS — E1140 Type 2 diabetes mellitus with diabetic neuropathy, unspecified: Secondary | ICD-10-CM | POA: Diagnosis not present

## 2022-05-20 DIAGNOSIS — Z7984 Long term (current) use of oral hypoglycemic drugs: Secondary | ICD-10-CM | POA: Diagnosis not present

## 2022-05-20 DIAGNOSIS — E1122 Type 2 diabetes mellitus with diabetic chronic kidney disease: Secondary | ICD-10-CM | POA: Diagnosis not present

## 2022-05-20 DIAGNOSIS — L97811 Non-pressure chronic ulcer of other part of right lower leg limited to breakdown of skin: Secondary | ICD-10-CM | POA: Diagnosis not present

## 2022-05-20 DIAGNOSIS — Z4800 Encounter for change or removal of nonsurgical wound dressing: Secondary | ICD-10-CM | POA: Diagnosis not present

## 2022-05-20 DIAGNOSIS — I50812 Chronic right heart failure: Secondary | ICD-10-CM | POA: Diagnosis not present

## 2022-05-26 ENCOUNTER — Ambulatory Visit: Payer: Medicare Other | Admitting: Family Medicine

## 2022-06-04 ENCOUNTER — Other Ambulatory Visit: Payer: Self-pay | Admitting: Family Medicine

## 2022-06-05 ENCOUNTER — Other Ambulatory Visit: Payer: Self-pay | Admitting: Family Medicine

## 2022-06-05 NOTE — Telephone Encounter (Signed)
Tamsulosin last rx: 08/19/21, #180 Carvedilol last rx: 08/19/21. #180 Last OV: 04/09/22, 2 wk f/u Next OV:  none  Denied valsartan-HCTZ request. Change in therapy. (See 02/28/22 OV notes.)

## 2022-06-05 NOTE — Telephone Encounter (Signed)
Atorvastatin last filled:  03/31/22, #90 Clonidine last filled:  10/232/23, #180 Last OV: 04/09/22, 2 wk f/u Next OV:  none

## 2022-06-06 DIAGNOSIS — E1140 Type 2 diabetes mellitus with diabetic neuropathy, unspecified: Secondary | ICD-10-CM | POA: Diagnosis not present

## 2022-06-06 DIAGNOSIS — E785 Hyperlipidemia, unspecified: Secondary | ICD-10-CM | POA: Diagnosis not present

## 2022-06-06 DIAGNOSIS — Z792 Long term (current) use of antibiotics: Secondary | ICD-10-CM | POA: Diagnosis not present

## 2022-06-06 DIAGNOSIS — Z4800 Encounter for change or removal of nonsurgical wound dressing: Secondary | ICD-10-CM | POA: Diagnosis not present

## 2022-06-06 DIAGNOSIS — I130 Hypertensive heart and chronic kidney disease with heart failure and stage 1 through stage 4 chronic kidney disease, or unspecified chronic kidney disease: Secondary | ICD-10-CM | POA: Diagnosis not present

## 2022-06-06 DIAGNOSIS — Z7984 Long term (current) use of oral hypoglycemic drugs: Secondary | ICD-10-CM | POA: Diagnosis not present

## 2022-06-06 DIAGNOSIS — L97211 Non-pressure chronic ulcer of right calf limited to breakdown of skin: Secondary | ICD-10-CM | POA: Diagnosis not present

## 2022-06-06 DIAGNOSIS — I890 Lymphedema, not elsewhere classified: Secondary | ICD-10-CM | POA: Diagnosis not present

## 2022-06-06 DIAGNOSIS — I50812 Chronic right heart failure: Secondary | ICD-10-CM | POA: Diagnosis not present

## 2022-06-06 DIAGNOSIS — E1122 Type 2 diabetes mellitus with diabetic chronic kidney disease: Secondary | ICD-10-CM | POA: Diagnosis not present

## 2022-06-06 DIAGNOSIS — I872 Venous insufficiency (chronic) (peripheral): Secondary | ICD-10-CM | POA: Diagnosis not present

## 2022-06-06 DIAGNOSIS — I878 Other specified disorders of veins: Secondary | ICD-10-CM | POA: Diagnosis not present

## 2022-06-06 DIAGNOSIS — E11622 Type 2 diabetes mellitus with other skin ulcer: Secondary | ICD-10-CM | POA: Diagnosis not present

## 2022-06-06 DIAGNOSIS — I6529 Occlusion and stenosis of unspecified carotid artery: Secondary | ICD-10-CM | POA: Diagnosis not present

## 2022-06-06 DIAGNOSIS — G8929 Other chronic pain: Secondary | ICD-10-CM | POA: Diagnosis not present

## 2022-06-06 DIAGNOSIS — M545 Low back pain, unspecified: Secondary | ICD-10-CM | POA: Diagnosis not present

## 2022-06-06 DIAGNOSIS — L97811 Non-pressure chronic ulcer of other part of right lower leg limited to breakdown of skin: Secondary | ICD-10-CM | POA: Diagnosis not present

## 2022-06-06 DIAGNOSIS — Z79899 Other long term (current) drug therapy: Secondary | ICD-10-CM | POA: Diagnosis not present

## 2022-06-06 DIAGNOSIS — N189 Chronic kidney disease, unspecified: Secondary | ICD-10-CM | POA: Diagnosis not present

## 2022-06-25 ENCOUNTER — Telehealth: Payer: Self-pay

## 2022-06-25 ENCOUNTER — Telehealth: Payer: Medicare Other

## 2022-06-25 NOTE — Telephone Encounter (Signed)
   CCM RN Visit Note   06/25/22 Name: SLATE DEBROUX MRN: [id number]      DOB: 1950-06-26  Subjective: Gregory Fisher is a 72 y.o. year old male who is a primary care patient of [name], MD. The patient was referred to the Chronic Care Management team for assistance with care management needs subsequent to provider initiation of CCM services and plan of care.      An unsuccessful outreach attempt was made today for a scheduled CCM visit.   PLAN: A HIPAA compliant phone message was left for the patient providing contact information and requesting a return call.    Horris Latino RN Care Manager/Chronic Care Management [phone]

## 2022-07-11 DIAGNOSIS — Z79899 Other long term (current) drug therapy: Secondary | ICD-10-CM | POA: Diagnosis not present

## 2022-07-11 DIAGNOSIS — L97929 Non-pressure chronic ulcer of unspecified part of left lower leg with unspecified severity: Secondary | ICD-10-CM | POA: Diagnosis not present

## 2022-07-11 DIAGNOSIS — Z89021 Acquired absence of right finger(s): Secondary | ICD-10-CM | POA: Diagnosis not present

## 2022-07-11 DIAGNOSIS — I50812 Chronic right heart failure: Secondary | ICD-10-CM | POA: Diagnosis not present

## 2022-07-11 DIAGNOSIS — I890 Lymphedema, not elsewhere classified: Secondary | ICD-10-CM | POA: Diagnosis not present

## 2022-07-11 DIAGNOSIS — I83029 Varicose veins of left lower extremity with ulcer of unspecified site: Secondary | ICD-10-CM | POA: Diagnosis not present

## 2022-07-11 DIAGNOSIS — E785 Hyperlipidemia, unspecified: Secondary | ICD-10-CM | POA: Diagnosis not present

## 2022-07-11 DIAGNOSIS — Z7984 Long term (current) use of oral hypoglycemic drugs: Secondary | ICD-10-CM | POA: Diagnosis not present

## 2022-07-11 DIAGNOSIS — I130 Hypertensive heart and chronic kidney disease with heart failure and stage 1 through stage 4 chronic kidney disease, or unspecified chronic kidney disease: Secondary | ICD-10-CM | POA: Diagnosis not present

## 2022-07-11 DIAGNOSIS — I83019 Varicose veins of right lower extremity with ulcer of unspecified site: Secondary | ICD-10-CM | POA: Diagnosis not present

## 2022-07-11 DIAGNOSIS — L97919 Non-pressure chronic ulcer of unspecified part of right lower leg with unspecified severity: Secondary | ICD-10-CM | POA: Diagnosis not present

## 2022-07-11 DIAGNOSIS — E1140 Type 2 diabetes mellitus with diabetic neuropathy, unspecified: Secondary | ICD-10-CM | POA: Diagnosis not present

## 2022-07-11 DIAGNOSIS — E1122 Type 2 diabetes mellitus with diabetic chronic kidney disease: Secondary | ICD-10-CM | POA: Diagnosis not present

## 2022-07-11 DIAGNOSIS — N189 Chronic kidney disease, unspecified: Secondary | ICD-10-CM | POA: Diagnosis not present

## 2022-07-11 DIAGNOSIS — Z87891 Personal history of nicotine dependence: Secondary | ICD-10-CM | POA: Diagnosis not present

## 2022-07-17 DIAGNOSIS — I890 Lymphedema, not elsewhere classified: Secondary | ICD-10-CM | POA: Diagnosis not present

## 2022-07-17 DIAGNOSIS — I83019 Varicose veins of right lower extremity with ulcer of unspecified site: Secondary | ICD-10-CM | POA: Diagnosis not present

## 2022-07-17 DIAGNOSIS — L97919 Non-pressure chronic ulcer of unspecified part of right lower leg with unspecified severity: Secondary | ICD-10-CM | POA: Diagnosis not present

## 2022-07-17 DIAGNOSIS — I83029 Varicose veins of left lower extremity with ulcer of unspecified site: Secondary | ICD-10-CM | POA: Diagnosis not present

## 2022-07-17 DIAGNOSIS — L97929 Non-pressure chronic ulcer of unspecified part of left lower leg with unspecified severity: Secondary | ICD-10-CM | POA: Diagnosis not present

## 2022-07-25 ENCOUNTER — Ambulatory Visit (INDEPENDENT_AMBULATORY_CARE_PROVIDER_SITE_OTHER): Payer: Medicare Other

## 2022-07-25 DIAGNOSIS — E1169 Type 2 diabetes mellitus with other specified complication: Secondary | ICD-10-CM

## 2022-07-25 DIAGNOSIS — I50812 Chronic right heart failure: Secondary | ICD-10-CM

## 2022-07-25 DIAGNOSIS — I10 Essential (primary) hypertension: Secondary | ICD-10-CM

## 2022-07-25 NOTE — Plan of Care (Signed)
Chronic Care Management Provider Comprehensive Care Plan    07/25/2022 Name: Gregory Fisher MRN: [id number] DOB: 1951-01-05  Referral to Chronic Care Management (CCM) services was placed by Provider:  [name] on Date: 03/01/22.    Chronic Condition 1: Diabetes Provider Assessment and Plan   Type 2 diabetes mellitus with other specified complication ([place])    Has glucometer but doesn't know how to use it. Advised bring to next appointment.  Continue metformin BID and will try to restart farxiga.  New cell phone # updated in chart.       Expected Outcome/Goals Addressed This Visit (Provider CCM goals/Provider Assessment and plan  Goal: CCM (Diabetes) Expected Outcome:  Monitor, Self-Manage And Reduce Symptoms of Diabetes Symptom Management Condition 1:      Chronic Condition 2: Hypertension Provider Assessment and Plan HTN (hypertension)       BP elevated today in setting of significant caffeine and sodium intake.  Advised get home cuff to monitor BP readings at home.  He continues carvedilol and clonidine BID, has continued 1/2 tab lasix daily and 1/2 valsartan daily.  Rx for large adult BP cuff provided today and to check with pharmacy or [facility] about getting one.        Expected Outcome/Goals Addressed This Visit (Provider CCM goals/Provider Assessment and plan  Goal: CCM (Hypertension) Expected Outcome:  Monitor, Self-Manage And Reduce Symptoms of Hypertension  Symptom Management Condition 2:     Chronic Condition 3: Heart Failure Provider Assessment and Plan Chronic right heart failure ([place])       Last echo 05/2020.  With worsening pedal edema, will update echocardiogram - try to get done at [facility] as he lives near [place].      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   Diverticulosis 03/27/2022   Chronic venous hypertension (idiopathic) with ulcer and inflammation of bilateral lower extremity ([place]) 03/06/2022   Dyspnea 03/01/2022   Transportation insecurity 11/21/2021   Cellulitis of right leg without foot 11/20/2021   Adrenal adenoma, left 09/29/2021   Chronic right heart failure ([place]) 10/12/2019   Fatty liver [id number]   Umbilical hernia [id number]   Postprandial RUQ pain 06/23/2017   Primary osteoarthritis of right knee 02/06/2017   Medicare annual wellness visit, subsequent 04/17/2015   Advanced care planning/counseling discussion 04/17/2015   Pruritic condition 08/01/2014   Obesity, morbid, BMI 40.0-49.9 ([place]) 08/01/2014   Type 2 diabetes mellitus with other specified complication ([place]) [id number]   Insomnia 12/12/2013   Right shoulder pain 07/25/2013   Mood disorder ([place]) 03/14/2013   Healthcare maintenance 01/19/2013   HTN (hypertension)    Hyperlipidemia associated with type 2 diabetes mellitus (HCC)    Vitamin D deficiency    Osteopenia    Ascending aorta dilation (HCC)    Benign prostatic hyperplasia    Carotid stenosis    Left rotator cuff tear     Medication Management  Current Outpatient Medications:    atorvastatin (LIPITOR) 80 MG tablet,  TAKE 1 TABLET BY MOUTH DAILY, Disp: 100 tablet, Rfl: 2   carvedilol (COREG) 12.5 MG tablet, TAKE 1 TABLET BY MOUTH TWICE  DAILY WITH MEALS, Disp: 200 tablet, Rfl: 2   cloNIDine (CATAPRES) 0.1 MG tablet, TAKE 1 TABLET BY MOUTH TWICE  DAILY, Disp: 200 tablet, Rfl: 2   dapagliflozin propanediol (FARXIGA) 5 MG  TABS tablet, Take 1 tablet (5 mg total) by mouth daily before breakfast., Disp: 90 tablet, Rfl: 3   furosemide (LASIX) 40 MG tablet, Take 40 mg by mouth., Disp: , Rfl:    LORazepam (ATIVAN) 1 MG tablet, TAKE 1 TABLET BY MOUTH EVERY DAY AS NEEDED FOR ANXIETY, Disp: 40 tablet, Rfl: 0   metFORMIN (GLUCOPHAGE) 500 MG tablet, TAKE 1 TABLET BY MOUTH TWICE  DAILY WITH MEALS, Disp: 200 tablet, Rfl: 2   tamsulosin (FLOMAX) 0.4 MG CAPS capsule, TAKE 2 CAPSULES BY MOUTH AT  BEDTIME, Disp: 200 capsule, Rfl: 2   valsartan (DIOVAN) 320 MG tablet, Take 0.5 tablets (160 mg total) by mouth daily., Disp: , Rfl:    Accu-Chek Softclix Lancets lancets, Use as instructed to check blood sugar 2 (times) a day, Disp: 200 each, Rfl: 3   aspirin EC 81 MG tablet, Take 81 mg by mouth daily.  (Patient not taking: Reported on 07/25/2022), Disp: , Rfl:    Blood Glucose Monitoring Suppl (ACCU-CHEK GUIDE ME) w/Device KIT, Use as instructed to check blood sugar 2 (times) a day, Disp: 1 kit, Rfl: 0   Cholecalciferol (VITAMIN D3) 25 MCG (1000 UT) CAPS, Take 2 capsules (2,000 Units total) by mouth daily., Disp: 30 capsule, Rfl:    cyanocobalamin 1000 MCG tablet, Take 1,000 mcg by mouth daily., Disp: , Rfl:    gabapentin (NEURONTIN) 300 MG capsule, Take 300 mg by mouth 2 (two) times daily. (Patient not taking: Reported on 07/25/2022), Disp: , Rfl:    glucose blood (ACCU-CHEK GUIDE) test strip, Use as instructed to check blood sugar 2 (times) a day, Disp: 200 strip, Rfl: 3  Cognitive Assessment Identity Confirmed: : Name; DOB Cognitive Status: Normal   Functional Assessment Hearing Difficulty or Deaf: no Wear Glasses or Blind: yes Vision Management: Wears Readers Concentrating, Remembering or Making Decisions Difficulty (CP): yes Concentration Management: Reports decreased short term memory Difficulty Communicating: no Difficulty Eating/Swallowing: no Walking or Climbing Stairs Difficulty: yes Walking or Climbing Stairs:  ambulation difficulty, requires equipment Mobility Management: Uses a cane Dressing/Bathing Difficulty: no Doing Errands Independently Difficulty (such as shopping) (CP): no Change in Functional Status Since Onset of Current Illness/Injury: no   Caregiver Assessment  Primary Source of Support/Comfort: community; friend; sibling(s) People in Home: sibling(s) Family Caregiver if Needed: none Primary Roles/Responsibilities: disabled   Planned Interventions      Interaction and coordination with outside resources, practitioners, and providers See CCM Referral  Care Plan: Printed and mailed to patient

## 2022-07-25 NOTE — Patient Instructions (Addendum)
Thank you for allowing the Chronic Care Management team to participate in your care. It was great speaking with you!   Following is a copy of the CCM Program Consent:  CCM service includes personalized support from designated clinical staff supervised by the physician, including individualized plan of care and coordination with other care providers 24/7 contact phone numbers for assistance for urgent and routine care needs. Service will only be billed when office clinical staff spend 20 minutes or more in a month to coordinate care. Only one practitioner may furnish and bill the service in a calendar month. The patient may stop CCM services at amy time (effective at the end of the month) by phone call to the office staff. The patient will be responsible for cost sharing (co-pay) or up to 20% of the service fee (after annual deductible is met)  Following is a copy of your full provider care plan:   Goals Addressed             This Visit's Progress    Goal: CCM (Congestive Heart Failure) Expected Outcome:  Monitor, Self-Manage And Reduce Symptoms of Congestive Heart Failure       Current Barriers:  Chronic Disease Management support and education needs related to CHF  Planned Interventions: Discussed current plan for CHF management.  Provided information regarding recommended weight parameters. Encouraged to notify provider for weight gain greater than 3 lbs overnight or weight gain greater than 5 lbs within a week. Confirmed having a working scale and weighing as advised. Reports not maintaining a log. Encouraged to weigh daily and record readings. Discussed s/sx of fluid overload and indications for notifying a provider. Denies chest discomfort or palpitations. Denies shortness of breath at rest. Reports edema in lower extremities. Reports currently requiring weekly leg wraps. Reports being followed by the [facility]. Encouraged to assess symptoms daily and contact  clinic with changes. Discussed nutritional intake. Advised to closely monitor sodium consumption and avoid highly processed foods when possible. Reviewed worsening s/sx related to CHF exacerbation and indications for seeking immediate medical attention. Assessed social determinant of health barriers.   Wt Readings from Last 3 Encounters:  04/09/22 281 lb 6 oz (127.6 kg)  03/26/22 279 lb 8 oz (126.8 kg)  03/05/22 279 lb 2 oz (126.6 kg)    Symptom Management: Take all medications as prescribed Monitor weight and record readings Adhere to recommended cardiac prudent/heart healthy diet Notify provider for weight gain outside of established parameters  Follow Up Plan:  Will follow up next month       Goal: CCM (Diabetes) Expected Outcome:  Monitor, Self-Manage And Reduce Symptoms of Diabetes       Current Barriers:  Chronic Disease Management support and education needs related to Diabetes  Planned Interventions: Reviewed provider's plan for diabetes management Reviewed medication. Reports taking medications as prescribed. Denies current concerns regarding medication management or prescriptions cost. Discussed importance of blood glucose readings. Reports recently receiving a monitor device. Reports being unsure how to operate the device. Unsure if it is a standard glucometer. Reports discussing with the pharmacy staff and being advised to bring the device in to review. Advised to monitor fasting blood sugars daily once device is set up. Advised to maintain a log. Encouraged to call if an in-person CCM visit is needed to assist. Provided information regarding hypoglycemia and hyperglycemia along with appropriate interventions.  Discussed nutritional intake and importance of complying with a diabetic diet. Reports currently utilizing the local  food bank and attempting to adhere to recommended diet. Advised to increase consumption of fruits, vegetables, and lean proteins. Advised to monitor  intake of carbohydrates and avoid foods and beverages with added sugar when possible. Agreed to call if additional nutritional resources are required. Discussed importance of completing recommended DM preventive care. Reports foot exam is up to date. Experiences numbness in his left foot d/t neuropathy. Reports the [facility] staff are currently monitoring his lower extremities closely. Reports currently requiring leg wraps once a week. Eye exam is not up to date. Will assist with scheduling. Discussed importance of completing ordered labs as prescribed.  Assessed social determinant of health barriers.   Lab Results  Component Value Date   HGBA1C 7.6 (A) 02/28/2022    Symptom Management: Attend all scheduled provider appointments Take medications as prescribed Monitor blood glucose and maintain a log Check feet daily for cuts, sores or redness Wash and dry feet carefully every day Wear comfortable, cotton socks Wear comfortable, well-fitting shoes Complete Eye Exam Complete appointments with [facility] as scheduled Read food labels for fat, fiber, carbohydrates and portion size Call provider office for new concerns or questions   Follow Up Plan:  Will follow up next month       Goal: CCM (Hypertension) Expected Outcome:  Monitor, Self-Manage And Reduce Symptoms of Hypertension       Current Barriers:  Chronic Disease Management support and education needs related to Hypertension  Planned Interventions: Reviewed current treatment plan related to Hypertension, self-management, and adherence to plan as established by provider.  Reviewed medications and indications for use. Reports taking medications as prescribed.  Provided information regarding established blood pressure parameters along with indications for notifying a provider. Advised to monitor BP consistently and record readings.  Reports weighing as advised d/t lower extremity edema. Reports currently  requiring weekly leg wraps. Reports current plan is to continue with the [facility] staff. Reviewed symptoms. Denies chest pain or palpitations. Reports history of occasional chest "tightness." Reports discussing with the Cardiology team.  Denies headaches, dizziness, or visual changes.  Discussed compliance with recommended cardiac prudent diet. Reports currently utilizing local food banks. Encouraged to read nutrition labels, continue monitoring sodium intake, and avoid highly processed foods when possible.  Discussed activity level. Reports not engaging in regular exercise. Reports previous plan was to enroll in [facility]. Reports he will follow up with PCP regarding updated plan.  Reviewed s/sx of heart attack, stroke and worsening symptoms that require immediate medical attention.   BP Readings from Last 3 Encounters:  04/09/22 (!) 162/82  03/26/22 134/70  03/05/22 120/66    Symptom Management: Take all medications as prescribed Attend all scheduled provider appointments Call pharmacy for medication refills 3-7 days in advance of running out of medications Check blood pressure  Keep a blood pressure log Call doctor for signs and symptoms of high blood pressure Consider plan for Cardio-Pulmonary Rehab Read nutrition labels. Eat whole grains, fruits and vegetables, lean meats and healthy fats Limit salt intake  Call provider office for new concerns or questions   Follow Up Plan:  Will follow up next month            Mr. Moschella verbalized understanding of instructions, educational materials, and care plan provided today and agreed to receive a mailed copy of patient instructions, educational materials, and care plan.   A member of the care management team will follow up next month.  Horris Latino RN Care Manager/Chronic Care Management [phone]

## 2022-07-25 NOTE — Chronic Care Management (AMB) (Signed)
Chronic Care Management   CCM RN Visit Note  07/25/2022 Name: Gregory Fisher MRN: [id number] DOB: 05/14/1951  Subjective: Gregory Fisher is a 72 y.o. year old male who is a primary care patient of [name], MD. The patient was referred to the Chronic Care Management team for assistance with care management needs subsequent to provider initiation of CCM services and plan of care.    Today's Visit:  Engaged with patient by telephone for initial visit.     SDOH Interventions Today    Flowsheet Row Most Recent Value  SDOH Interventions   Food Insecurity Interventions Intervention Not Indicated  [Reports using local food bank. Will update team if additional resources are needed]  Housing Interventions Intervention Not Indicated  Transportation Interventions Intervention Not Indicated  Utilities Interventions Intervention Not Indicated  Financial Strain Interventions Intervention Not Indicated  Physical Activity Interventions Other (Comments)  [Physical Activity limited d/t leg wounds/wraps]  Stress Interventions Other (Comment)  [Reports some stress d/t health condition. Declined current need for resources]  Social Connections Interventions Intervention Not Indicated        Goals Addressed             This Visit's Progress    Goal: CCM (Congestive Heart Failure) Expected Outcome:  Monitor, Self-Manage And Reduce Symptoms of Congestive Heart Failure       Current Barriers:  Chronic Disease Management support and education needs related to CHF  Planned Interventions: Discussed current plan for CHF management.  Provided information regarding recommended weight parameters. Encouraged to notify provider for weight gain greater than 3 lbs overnight or weight gain greater than 5 lbs within a week. Confirmed having a working scale and weighing as advised. Reports not maintaining a log. Encouraged to weigh daily and record readings. Discussed s/sx of fluid overload and  indications for notifying a provider. Denies chest discomfort or palpitations. Denies shortness of breath at rest. Reports edema in lower extremities. Reports currently requiring weekly leg wraps. Reports being followed by the [facility]. Encouraged to assess symptoms daily and contact clinic with changes. Discussed nutritional intake. Advised to closely monitor sodium consumption and avoid highly processed foods when possible. Reviewed worsening s/sx related to CHF exacerbation and indications for seeking immediate medical attention. Assessed social determinant of health barriers.   Wt Readings from Last 3 Encounters:  04/09/22 281 lb 6 oz (127.6 kg)  03/26/22 279 lb 8 oz (126.8 kg)  03/05/22 279 lb 2 oz (126.6 kg)    Symptom Management: Take all medications as prescribed Monitor weight and record readings Adhere to recommended cardiac prudent/heart healthy diet Notify provider for weight gain outside of established parameters  Follow Up Plan:  Will follow up next month       Goal: CCM (Diabetes) Expected Outcome:  Monitor, Self-Manage And Reduce Symptoms of Diabetes       Current Barriers:  Chronic Disease Management support and education needs related to Diabetes  Planned Interventions: Reviewed provider's plan for diabetes management Reviewed medication. Reports taking medications as prescribed. Denies current concerns regarding medication management or prescriptions cost. Discussed importance of blood glucose readings. Reports recently receiving a monitor device. Reports being unsure how to operate the device. Unsure if it is a standard glucometer. Reports discussing with the pharmacy staff and being advised to bring the device in to review. Advised to monitor fasting blood sugars daily once device is set up. Advised to maintain a log. Encouraged to call if an in-person CCM  visit is needed to assist. Provided information regarding hypoglycemia and  hyperglycemia along with appropriate interventions.  Discussed nutritional intake and importance of complying with a diabetic diet. Reports currently utilizing the local food bank and attempting to adhere to recommended diet. Advised to increase consumption of fruits, vegetables, and lean proteins. Advised to monitor intake of carbohydrates and avoid foods and beverages with added sugar when possible. Agreed to call if additional nutritional resources are required. Discussed importance of completing recommended DM preventive care. Reports foot exam is up to date. Experiences numbness in his left foot d/t neuropathy. Reports the [facility] staff are currently monitoring his lower extremities closely. Reports currently requiring leg wraps once a week. Eye exam is not up to date. Will assist with scheduling. Discussed importance of completing ordered labs as prescribed.  Assessed social determinant of health barriers.   Lab Results  Component Value Date   HGBA1C 7.6 (A) 02/28/2022    Symptom Management: Attend all scheduled provider appointments Take medications as prescribed Monitor blood glucose and maintain a log Check feet daily for cuts, sores or redness Wash and dry feet carefully every day Wear comfortable, cotton socks Wear comfortable, well-fitting shoes Complete Eye Exam Complete appointments with [facility] as scheduled Read food labels for fat, fiber, carbohydrates and portion size Call provider office for new concerns or questions   Follow Up Plan:  Will follow up next month       Goal: CCM (Hypertension) Expected Outcome:  Monitor, Self-Manage And Reduce Symptoms of Hypertension       Current Barriers:  Chronic Disease Management support and education needs related to Hypertension  Planned Interventions: Reviewed current treatment plan related to Hypertension, self-management, and adherence to plan as established by provider.  Reviewed medications and  indications for use. Reports taking medications as prescribed.  Provided information regarding established blood pressure parameters along with indications for notifying a provider. Advised to monitor BP consistently and record readings.  Reports weighing as advised d/t lower extremity edema. Reports currently requiring weekly leg wraps. Reports current plan is to continue with the [facility] staff. Reviewed symptoms. Denies chest pain or palpitations. Reports history of occasional chest "tightness." Reports discussing with the Cardiology team.  Denies headaches, dizziness, or visual changes.  Discussed compliance with recommended cardiac prudent diet. Reports currently utilizing local food banks. Encouraged to read nutrition labels, continue monitoring sodium intake, and avoid highly processed foods when possible.  Discussed activity level. Reports not engaging in regular exercise. Reports previous plan was to enroll in [facility]. Reports he will follow up with PCP regarding updated plan.  Reviewed s/sx of heart attack, stroke and worsening symptoms that require immediate medical attention.   BP Readings from Last 3 Encounters:  04/09/22 (!) 162/82  03/26/22 134/70  03/05/22 120/66    Symptom Management: Take all medications as prescribed Attend all scheduled provider appointments Call pharmacy for medication refills 3-7 days in advance of running out of medications Check blood pressure  Keep a blood pressure log Call doctor for signs and symptoms of high blood pressure Consider plan for Cardio-Pulmonary Rehab Read nutrition labels. Eat whole grains, fruits and vegetables, lean meats and healthy fats Limit salt intake  Call provider office for new concerns or questions   Follow Up Plan:  Will follow up next month            PLAN: A member of the care management team will follow up next month.  Horris Latino RN Care Manager/Chronic Care  Management [phone]

## 2022-07-31 ENCOUNTER — Other Ambulatory Visit: Payer: Self-pay | Admitting: Family Medicine

## 2022-07-31 ENCOUNTER — Telehealth: Payer: Self-pay | Admitting: Family Medicine

## 2022-07-31 NOTE — Telephone Encounter (Signed)
Called patient to schedule Medicare Annual Wellness Visit (AWV). Left message for patient to call back and schedule Medicare Annual Wellness Visit (AWV).  Last date of AWV: 08/17/2022  Please schedule an appointment at any time with [facility].  If any questions, please contact me at [phone].  Thank you ,  [place] Direct Dial: [phone]

## 2022-08-01 ENCOUNTER — Telehealth: Payer: Medicare Other

## 2022-08-01 NOTE — Telephone Encounter (Signed)
Change in therapy:  Rx changed to plain valsartan 320 mg daily (see 02/28/22 OV notes). Then valsartan 320 mg stopped (see 03/26/22 OV notes).  Request denied.

## 2022-08-07 DIAGNOSIS — I50810 Right heart failure, unspecified: Secondary | ICD-10-CM | POA: Diagnosis not present

## 2022-08-07 DIAGNOSIS — I110 Hypertensive heart disease with heart failure: Secondary | ICD-10-CM | POA: Diagnosis not present

## 2022-08-07 DIAGNOSIS — E1159 Type 2 diabetes mellitus with other circulatory complications: Secondary | ICD-10-CM

## 2022-08-12 ENCOUNTER — Telehealth: Payer: Self-pay

## 2022-08-12 NOTE — Progress Notes (Cosign Needed)
Opened in Error.

## 2022-09-17 ENCOUNTER — Other Ambulatory Visit: Payer: Self-pay | Admitting: Family Medicine

## 2022-09-17 NOTE — Telephone Encounter (Signed)
Attempted to contact pt on home and cell #s. No answer, no vm. Need to find out if pt is still taking potassium twice a day.  Potassium Last filled:  03/06/22, #180 Last OV:  04/09/22, 2 wk f/u Next OV:  none

## 2022-10-23 ENCOUNTER — Other Ambulatory Visit: Payer: Self-pay | Admitting: Family Medicine

## 2022-10-23 NOTE — Telephone Encounter (Signed)
Rx discontinued by Dr. Reece Agar on 03/27/22.   Request denied.

## 2022-11-18 ENCOUNTER — Other Ambulatory Visit: Payer: Self-pay | Admitting: Family Medicine

## 2022-11-18 DIAGNOSIS — I10 Essential (primary) hypertension: Secondary | ICD-10-CM

## 2022-11-18 DIAGNOSIS — F39 Unspecified mood [affective] disorder: Secondary | ICD-10-CM

## 2022-11-18 NOTE — Telephone Encounter (Signed)
Name of Medication: Lorazepam Name of Pharmacy: [facility] Last Fill or Written Date and Quantity: 03/04/22, #40 Last Office Visit and Type: 04/09/22, 2 wk f/u Next Office Visit and Type: none Last Controlled Substance Agreement Date: none Last UDS:  none  Pt is taking furosemide 40 mg daily and Klor-Con was stopped.

## 2022-11-19 NOTE — Telephone Encounter (Signed)
He was due for 4 wk f/u visit back in 05/2022.  Please call to schedule appointment. He needs appt prior to more refills past today's.

## 2022-11-20 NOTE — Telephone Encounter (Signed)
Lvmtcb

## 2023-01-11 ENCOUNTER — Other Ambulatory Visit: Payer: Self-pay | Admitting: Family Medicine

## 2023-01-11 DIAGNOSIS — E876 Hypokalemia: Secondary | ICD-10-CM

## 2023-01-11 DIAGNOSIS — I10 Essential (primary) hypertension: Secondary | ICD-10-CM

## 2023-01-11 DIAGNOSIS — F39 Unspecified mood [affective] disorder: Secondary | ICD-10-CM

## 2023-01-11 DIAGNOSIS — E1169 Type 2 diabetes mellitus with other specified complication: Secondary | ICD-10-CM

## 2023-01-12 NOTE — Telephone Encounter (Signed)
Denied 3 refill requests. Pt needs OV for additional refills (see 11/18/22 refill note) and is well overdue for CPE.   Name of Medication: Lorazepam Name of Pharmacy: [facility] Last Fill or Written Date and Quantity: 11/19/22, #40 Last Office Visit and Type: 04/09/22, 2 wk f/u Next Office Visit and Type: none Last Controlled Substance Agreement Date: none Last UDS:  none

## 2023-01-14 MED ORDER — POTASSIUM CHLORIDE CRYS ER 10 MEQ PO TBCR: 10.0000 meq | EXTENDED_RELEASE_TABLET | Freq: Two times a day (BID) | ORAL | 1 refills | Status: DC

## 2023-01-14 MED ORDER — FUROSEMIDE 40 MG PO TABS: 40.0000 mg | ORAL_TABLET | Freq: Every day | ORAL | 1 refills | Status: DC

## 2023-01-14 NOTE — Telephone Encounter (Signed)
Please call to schedule OV 

## 2023-01-27 ENCOUNTER — Ambulatory Visit (INDEPENDENT_AMBULATORY_CARE_PROVIDER_SITE_OTHER): Payer: Medicare HMO

## 2023-01-27 VITALS — Wt 281.0 lb

## 2023-01-27 DIAGNOSIS — Z0000 Encounter for general adult medical examination without abnormal findings: Secondary | ICD-10-CM | POA: Diagnosis not present

## 2023-01-27 DIAGNOSIS — Z1211 Encounter for screening for malignant neoplasm of colon: Secondary | ICD-10-CM

## 2023-01-27 NOTE — Patient Instructions (Signed)
Gregory Fisher , Thank you for taking time to come for your Medicare Wellness Visit. I appreciate your ongoing commitment to your health goals. Please review the following plan we discussed and let me know if I can assist you in the future.   Referrals/Orders/Follow-Ups/Clinician Recommendations: pt stated work on getting healthier   This is a list of the screening recommended for you and due dates:  Health Maintenance  Topic Date Due   Zoster (Shingles) Vaccine (1 of 2) Never done   Stool Blood Test  05/20/2019   Eye exam for diabetics  08/12/2019   COVID-19 Vaccine (4 - 2023-24 season) 02/07/2022   Yearly kidney health urinalysis for diabetes  08/20/2022   Hemoglobin A1C  08/29/2022   Complete foot exam   11/21/2022   DTaP/Tdap/Td vaccine (2 - Td or Tdap) 01/20/2023   Flu Shot  01/08/2023   Colon Cancer Screening  04/18/2026*   Yearly kidney function blood test for diabetes  04/10/2023   Medicare Annual Wellness Visit  01/27/2024   Pneumonia Vaccine  Completed   Hepatitis C Screening  Completed   HPV Vaccine  Aged Out  *Topic was postponed. The date shown is not the original due date.    Advanced directives: (Declined) Advance directive discussed with you today. Even though you declined this today, please call our office should you change your mind, and we can give you the proper paperwork for you to fill out.  Next Medicare Annual Wellness Visit scheduled for next year: yes   Preventive Care 66 Years and Older, Male  Preventive care refers to lifestyle choices and visits with your health care provider that can promote health and wellness. What does preventive care include? A yearly physical exam. This is also called an annual well check. Dental exams once or twice a year. Routine eye exams. Ask your health care provider how often you should have your eyes checked. Personal lifestyle choices, including: Daily care of your teeth and gums. Regular physical activity. Eating a  healthy diet. Avoiding tobacco and drug use. Limiting alcohol use. Practicing safe sex. Taking low doses of aspirin every day. Taking vitamin and mineral supplements as recommended by your health care provider. What happens during an annual well check? The services and screenings done by your health care provider during your annual well check will depend on your age, overall health, lifestyle risk factors, and family history of disease. Counseling  Your health care provider may ask you questions about your: Alcohol use. Tobacco use. Drug use. Emotional well-being. Home and relationship well-being. Sexual activity. Eating habits. History of falls. Memory and ability to understand (cognition). Work and work Astronomer. Screening  You may have the following tests or measurements: Height, weight, and BMI. Blood pressure. Lipid and cholesterol levels. These may be checked every 5 years, or more frequently if you are over 35 years old. Skin check. Lung cancer screening. You may have this screening every year starting at age 54 if you have a 30-pack-year history of smoking and currently smoke or have quit within the past 15 years. Fecal occult blood test (FOBT) of the stool. You may have this test every year starting at age 48. Flexible sigmoidoscopy or colonoscopy. You may have a sigmoidoscopy every 5 years or a colonoscopy every 10 years starting at age 71. Prostate cancer screening. Recommendations will vary depending on your family history and other risks. Hepatitis C blood test. Hepatitis B blood test. Sexually transmitted disease (STD) testing. Diabetes screening. This is done  by checking your blood sugar (glucose) after you have not eaten for a while (fasting). You may have this done every 1-3 years. Abdominal aortic aneurysm (AAA) screening. You may need this if you are a current or former smoker. Osteoporosis. You may be screened starting at age 44 if you are at high risk. Talk  with your health care provider about your test results, treatment options, and if necessary, the need for more tests. Vaccines  Your health care provider may recommend certain vaccines, such as: Influenza vaccine. This is recommended every year. Tetanus, diphtheria, and acellular pertussis (Tdap, Td) vaccine. You may need a Td booster every 10 years. Zoster vaccine. You may need this after age 64. Pneumococcal 13-valent conjugate (PCV13) vaccine. One dose is recommended after age 33. Pneumococcal polysaccharide (PPSV23) vaccine. One dose is recommended after age 73. Talk to your health care provider about which screenings and vaccines you need and how often you need them. This information is not intended to replace advice given to you by your health care provider. Make sure you discuss any questions you have with your health care provider. Document Released: 06/22/2015 Document Revised: 02/13/2016 Document Reviewed: 03/27/2015 Elsevier Interactive Patient Education  2017 [employer].  Fall Prevention in the Home Falls can cause injuries. They can happen to people of all ages. There are many things you can do to make your home safe and to help prevent falls. What can I do on the outside of my home? Regularly fix the edges of walkways and driveways and fix any cracks. Remove anything that might make you trip as you walk through a door, such as a raised step or threshold. Trim any bushes or trees on the path to your home. Use bright outdoor lighting. Clear any walking paths of anything that might make someone trip, such as rocks or tools. Regularly check to see if handrails are loose or broken. Make sure that both sides of any steps have handrails. Any raised decks and porches should have guardrails on the edges. Have any leaves, snow, or ice cleared regularly. Use sand or salt on walking paths during winter. Clean up any spills in your garage right away. This includes oil or grease  spills. What can I do in the bathroom? Use night lights. Install grab bars by the toilet and in the tub and shower. Do not use towel bars as grab bars. Use non-skid mats or decals in the tub or shower. If you need to sit down in the shower, use a plastic, non-slip stool. Keep the floor dry. Clean up any water that spills on the floor as soon as it happens. Remove soap buildup in the tub or shower regularly. Attach bath mats securely with double-sided non-slip rug tape. Do not have throw rugs and other things on the floor that can make you trip. What can I do in the bedroom? Use night lights. Make sure that you have a light by your bed that is easy to reach. Do not use any sheets or blankets that are too big for your bed. They should not hang down onto the floor. Have a firm chair that has side arms. You can use this for support while you get dressed. Do not have throw rugs and other things on the floor that can make you trip. What can I do in the kitchen? Clean up any spills right away. Avoid walking on wet floors. Keep items that you use a lot in easy-to-reach places. If you need to  reach something above you, use a strong step stool that has a grab bar. Keep electrical cords out of the way. Do not use floor polish or wax that makes floors slippery. If you must use wax, use non-skid floor wax. Do not have throw rugs and other things on the floor that can make you trip. What can I do with my stairs? Do not leave any items on the stairs. Make sure that there are handrails on both sides of the stairs and use them. Fix handrails that are broken or loose. Make sure that handrails are as long as the stairways. Check any carpeting to make sure that it is firmly attached to the stairs. Fix any carpet that is loose or worn. Avoid having throw rugs at the top or bottom of the stairs. If you do have throw rugs, attach them to the floor with carpet tape. Make sure that you have a light switch at the  top of the stairs and the bottom of the stairs. If you do not have them, ask someone to add them for you. What else can I do to help prevent falls? Wear shoes that: Do not have high heels. Have rubber bottoms. Are comfortable and fit you well. Are closed at the toe. Do not wear sandals. If you use a stepladder: Make sure that it is fully opened. Do not climb a closed stepladder. Make sure that both sides of the stepladder are locked into place. Ask someone to hold it for you, if possible. Clearly mark and make sure that you can see: Any grab bars or handrails. First and last steps. Where the edge of each step is. Use tools that help you move around (mobility aids) if they are needed. These include: Canes. Walkers. Scooters. Crutches. Turn on the lights when you go into a dark area. Replace any light bulbs as soon as they burn out. Set up your furniture so you have a clear path. Avoid moving your furniture around. If any of your floors are uneven, fix them. If there are any pets around you, be aware of where they are. Review your medicines with your doctor. Some medicines can make you feel dizzy. This can increase your chance of falling. Ask your doctor what other things that you can do to help prevent falls. This information is not intended to replace advice given to you by your health care provider. Make sure you discuss any questions you have with your health care provider. Document Released: 03/22/2009 Document Revised: 11/01/2015 Document Reviewed: 06/30/2014 Elsevier Interactive Patient Education  2017 [employer].
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
## 2023-01-28 ENCOUNTER — Emergency Department (HOSPITAL_COMMUNITY): Payer: Medicare HMO

## 2023-01-28 ENCOUNTER — Encounter (HOSPITAL_COMMUNITY): Payer: Self-pay

## 2023-01-28 ENCOUNTER — Emergency Department (HOSPITAL_COMMUNITY)
Admission: EM | Admit: 2023-01-28 | Discharge: 2023-01-29 | Disposition: A | Discharge status: Home / Self Care | Payer: Medicare HMO | Attending: Emergency Medicine | Admitting: Emergency Medicine

## 2023-01-28 ENCOUNTER — Other Ambulatory Visit: Payer: Self-pay

## 2023-01-28 DIAGNOSIS — R479 Unspecified speech disturbances: Secondary | ICD-10-CM | POA: Diagnosis not present

## 2023-01-28 DIAGNOSIS — Z79899 Other long term (current) drug therapy: Secondary | ICD-10-CM | POA: Insufficient documentation

## 2023-01-28 DIAGNOSIS — R221 Localized swelling, mass and lump, neck: Secondary | ICD-10-CM | POA: Diagnosis not present

## 2023-01-28 DIAGNOSIS — I6502 Occlusion and stenosis of left vertebral artery: Secondary | ICD-10-CM | POA: Diagnosis not present

## 2023-01-28 DIAGNOSIS — I6523 Occlusion and stenosis of bilateral carotid arteries: Secondary | ICD-10-CM | POA: Diagnosis not present

## 2023-01-28 DIAGNOSIS — R4781 Slurred speech: Secondary | ICD-10-CM | POA: Insufficient documentation

## 2023-01-28 DIAGNOSIS — I6782 Cerebral ischemia: Secondary | ICD-10-CM | POA: Diagnosis not present

## 2023-01-28 DIAGNOSIS — J351 Hypertrophy of tonsils: Secondary | ICD-10-CM | POA: Diagnosis not present

## 2023-01-28 DIAGNOSIS — E119 Type 2 diabetes mellitus without complications: Secondary | ICD-10-CM | POA: Diagnosis not present

## 2023-01-28 DIAGNOSIS — J358 Other chronic diseases of tonsils and adenoids: Secondary | ICD-10-CM

## 2023-01-28 DIAGNOSIS — R599 Enlarged lymph nodes, unspecified: Secondary | ICD-10-CM | POA: Diagnosis not present

## 2023-01-28 DIAGNOSIS — R791 Abnormal coagulation profile: Secondary | ICD-10-CM | POA: Diagnosis not present

## 2023-01-28 DIAGNOSIS — I10 Essential (primary) hypertension: Secondary | ICD-10-CM | POA: Diagnosis not present

## 2023-01-28 DIAGNOSIS — R799 Abnormal finding of blood chemistry, unspecified: Secondary | ICD-10-CM | POA: Insufficient documentation

## 2023-01-28 DIAGNOSIS — G319 Degenerative disease of nervous system, unspecified: Secondary | ICD-10-CM | POA: Diagnosis not present

## 2023-01-28 DIAGNOSIS — R220 Localized swelling, mass and lump, head: Secondary | ICD-10-CM | POA: Diagnosis not present

## 2023-01-28 DIAGNOSIS — Z7984 Long term (current) use of oral hypoglycemic drugs: Secondary | ICD-10-CM | POA: Insufficient documentation

## 2023-01-28 DIAGNOSIS — I6529 Occlusion and stenosis of unspecified carotid artery: Secondary | ICD-10-CM | POA: Diagnosis not present

## 2023-01-28 DIAGNOSIS — R29818 Other symptoms and signs involving the nervous system: Secondary | ICD-10-CM | POA: Diagnosis not present

## 2023-01-28 DIAGNOSIS — I700 Atherosclerosis of aorta: Secondary | ICD-10-CM | POA: Diagnosis not present

## 2023-01-28 LAB — I-STAT CHEM 8, ED
BUN: 13 mg/dL (ref 8–23)
Calcium, Ion: 1.11 mmol/L — ABNORMAL LOW (ref 1.15–1.40)
Chloride: 103 mmol/L (ref 98–111)
Creatinine, Ser: 1 mg/dL (ref 0.61–1.24)
Glucose, Bld: 119 mg/dL — ABNORMAL HIGH (ref 70–99)
HCT: 43 % (ref 39.0–52.0)
Hemoglobin: 14.6 g/dL (ref 13.0–17.0)
Potassium: 3.6 mmol/L (ref 3.5–5.1)
Sodium: 138 mmol/L (ref 135–145)
TCO2: 22 mmol/L (ref 22–32)

## 2023-01-28 LAB — ETHANOL: Alcohol, Ethyl (B): <10 mg/dL (ref <10)

## 2023-01-28 LAB — DIFFERENTIAL
Abs Immature Granulocytes: 0.02 10*3/uL (ref 0.00–0.07)
Basophils Absolute: 0.1 10*3/uL (ref 0.0–0.1)
Basophils Relative: 1 %
Eosinophils Absolute: 0.3 10*3/uL (ref 0.0–0.5)
Eosinophils Relative: 3 %
Immature Granulocytes: 0 %
Lymphocytes Relative: 8 %
Lymphs Abs: 0.7 10*3/uL (ref 0.7–4.0)
Monocytes Absolute: 0.7 10*3/uL (ref 0.1–1.0)
Monocytes Relative: 7 %
Neutro Abs: 7.4 10*3/uL (ref 1.7–7.7)
Neutrophils Relative %: 81 %

## 2023-01-28 LAB — COMPREHENSIVE METABOLIC PANEL
ALT: 21 U/L (ref 0–44)
AST: 25 U/L (ref 15–41)
Albumin: 3.6 g/dL (ref 3.5–5.0)
Alkaline Phosphatase: 128 U/L — ABNORMAL HIGH (ref 38–126)
Anion gap: 15 (ref 5–15)
BUN: 12 mg/dL (ref 8–23)
CO2: 24 mmol/L (ref 22–32)
Calcium: 9.6 mg/dL (ref 8.9–10.3)
Chloride: 99 mmol/L (ref 98–111)
Creatinine, Ser: 1.08 mg/dL (ref 0.61–1.24)
GFR, Estimated: >60 mL/min (ref >60)
Glucose, Bld: 121 mg/dL — ABNORMAL HIGH (ref 70–99)
Potassium: 3.7 mmol/L (ref 3.5–5.1)
Sodium: 138 mmol/L (ref 135–145)
Total Bilirubin: 0.7 mg/dL (ref 0.3–1.2)
Total Protein: 7.1 g/dL (ref 6.5–8.1)

## 2023-01-28 LAB — CBG MONITORING, ED: Glucose-Capillary: 129 mg/dL — ABNORMAL HIGH (ref 70–99)

## 2023-01-28 LAB — PROTIME-INR
INR: 1.1 (ref 0.8–1.2)
Prothrombin Time: 14.3 seconds (ref 11.4–15.2)

## 2023-01-28 LAB — CBC
HCT: 41.8 % (ref 39.0–52.0)
Hemoglobin: 14 g/dL (ref 13.0–17.0)
MCH: 29.7 pg (ref 26.0–34.0)
MCHC: 33.5 g/dL (ref 30.0–36.0)
MCV: 88.7 fL (ref 80.0–100.0)
Platelets: 282 10*3/uL (ref 150–400)
RBC: 4.71 MIL/uL (ref 4.22–5.81)
RDW: 14.2 % (ref 11.5–15.5)
WBC: 9.1 10*3/uL (ref 4.0–10.5)
nRBC: 0 % (ref 0.0–0.2)

## 2023-01-28 LAB — APTT: aPTT: 32 seconds (ref 24–36)

## 2023-01-28 MED ORDER — IOHEXOL 350 MG/ML SOLN
75.0000 mL | Freq: Once | INTRAVENOUS | Status: AC | PRN
  Administered 2023-01-28: 75 mL via INTRAVENOUS

## 2023-01-28 MED ORDER — SODIUM CHLORIDE 0.9% FLUSH: 3.0000 mL | Freq: Once | INTRAVENOUS | Status: DC

## 2023-01-28 NOTE — ED Triage Notes (Signed)
Pt states he has int speech impedimentx1wk. Pt states it got worse today. Pt states his speech has been slurred and states his friend couldn't understand what he was saying.

## 2023-01-28 NOTE — ED Notes (Signed)
Report received from [facility], assumed care of patient at this time.

## 2023-01-28 NOTE — Discharge Instructions (Signed)
Follow up with ENT in the morning

## 2023-01-28 NOTE — ED Provider Notes (Signed)
[place] EMERGENCY DEPARTMENT AT [facility] Provider Note   CSN: [id number] Arrival date & time: 01/28/23  1113     History  Chief Complaint  Patient presents with   Neurologic Problem    Gregory Fisher is a 72 y.o. male.  72 yo M with a cc of slurred speech.  Going on for the past week or two.  Seems to come and go.  Worse at night it seems though had had significant issues today in the morning.  He lives in [place] but came here to go to the bank and his friend suggested he check and while he was here to be evaluated.  He feels like it is a little bit better now but still feels like it is slurred.  He denies any other obvious symptoms denies one-sided numbness or weakness denies difficulty swallowing denies fevers.  He denies cough or congestion.  Denies headache.   Neurologic Problem       Home Medications Prior to Admission medications   Medication Sig Start Date End Date Taking? Authorizing Provider  Accu-Chek Softclix Lancets lancets Use as instructed to check blood sugar 2 (times) a day 11/22/21   Eustaquio Boyden, MD  atorvastatin (LIPITOR) 80 MG tablet TAKE 1 TABLET BY MOUTH DAILY 06/05/22   Eustaquio Boyden, MD  Blood Glucose Monitoring Suppl (ACCU-CHEK GUIDE ME) w/Device KIT Use as instructed to check blood sugar 2 (times) a day 11/22/21   Eustaquio Boyden, MD  carvedilol (COREG) 12.5 MG tablet TAKE 1 TABLET BY MOUTH TWICE  DAILY WITH MEALS 06/06/22   Eustaquio Boyden, MD  Cholecalciferol (VITAMIN D3) 25 MCG (1000 UT) CAPS Take 2 capsules (2,000 Units total) by mouth daily. Patient not taking: Reported on 01/27/2023 05/05/18   Eustaquio Boyden, MD  cloNIDine (CATAPRES) 0.1 MG tablet TAKE 1 TABLET BY MOUTH TWICE  DAILY 06/05/22   Eustaquio Boyden, MD  dapagliflozin propanediol (FARXIGA) 5 MG TABS tablet Take 1 tablet (5 mg total) by mouth daily before breakfast. 05/09/22   Eustaquio Boyden, MD  furosemide (LASIX) 40 MG tablet Take 1 tablet (40 mg  total) by mouth daily. 01/14/23   Eustaquio Boyden, MD  glucose blood (ACCU-CHEK GUIDE) test strip Use as instructed to check blood sugar 2 (times) a day 11/22/21   Eustaquio Boyden, MD  LORazepam (ATIVAN) 1 MG tablet TAKE 1 TABLET BY MOUTH EVERY DAY AS NEEDED FOR ANXIETY 01/14/23   Eustaquio Boyden, MD  metFORMIN (GLUCOPHAGE) 500 MG tablet TAKE 1 TABLET BY MOUTH TWICE  DAILY WITH MEALS 04/29/22   Eustaquio Boyden, MD  potassium chloride (KLOR-CON M10) 10 MEQ tablet Take 1 tablet (10 mEq total) by mouth 2 (two) times daily. 01/14/23   Eustaquio Boyden, MD  tamsulosin (FLOMAX) 0.4 MG CAPS capsule TAKE 2 CAPSULES BY MOUTH AT  BEDTIME 06/06/22   Eustaquio Boyden, MD  valsartan (DIOVAN) 320 MG tablet Take 0.5 tablets (160 mg total) by mouth daily. 04/09/22   Eustaquio Boyden, MD      Allergies    Patient has no known allergies.    Review of Systems   Review of Systems  Physical Exam Updated Vital Signs BP 107/82   Pulse 70   Temp 98.2 F (36.8 C) (Oral)   Resp 19   Ht 5' 6.5" (1.689 m)   Wt 127.5 kg   SpO2 98%   BMI 44.69 kg/m  Physical Exam Vitals and nursing note reviewed.  Constitutional:      Appearance: He is well-developed.  HENT:  Head: Normocephalic and atraumatic.     Mouth/Throat:     Comments: Patient has a fairly large tonsillar mass on the right with some mild displacement of the uvula from right to left Eyes:     Pupils: Pupils are equal, round, and reactive to light.  Neck:     Vascular: No JVD.  Cardiovascular:     Rate and Rhythm: Normal rate and regular rhythm.     Heart sounds: No murmur heard.    No friction rub. No gallop.  Pulmonary:     Effort: No respiratory distress.     Breath sounds: No wheezing.  Abdominal:     General: There is no distension.     Tenderness: There is no abdominal tenderness. There is no guarding or rebound.  Musculoskeletal:        General: Normal range of motion.     Cervical back: Normal range of motion and neck supple.   Skin:    Coloration: Skin is not pale.     Findings: No rash.  Neurological:     Mental Status: He is alert and oriented to person, place, and time.     Comments: No appreciable one-sided weakness cranial nerves II through XII appear to be intact with the exception of the mass to the right side of the posterior oropharynx.  Psychiatric:        Behavior: Behavior normal.     ED Results / Procedures / Treatments   Labs (all labs ordered are listed, but only abnormal results are displayed) Labs Reviewed  COMPREHENSIVE METABOLIC PANEL - Abnormal; Notable for the following components:      Result Value   Glucose, Bld 121 (*)    Alkaline Phosphatase 128 (*)    All other components within normal limits  I-STAT CHEM 8, ED - Abnormal; Notable for the following components:   Glucose, Bld 119 (*)    Calcium, Ion 1.11 (*)    All other components within normal limits  CBG MONITORING, ED - Abnormal; Notable for the following components:   Glucose-Capillary 129 (*)    All other components within normal limits  PROTIME-INR  APTT  CBC  DIFFERENTIAL  ETHANOL    EKG EKG Interpretation Date/Time:  Wednesday January 28 2023 11:08:00 EDT Ventricular Rate:  91 PR Interval:  172 QRS Duration:  82 QT Interval:  352 QTC Calculation: 432 R Axis:   65  Text Interpretation: Normal sinus rhythm Nonspecific ST abnormality Abnormal ECG No significant change since last tracing Confirmed by Melene Plan [phone]) on 01/28/2023 6:27:51 PM  Radiology MR BRAIN WO CONTRAST  Result Date: 01/28/2023 CLINICAL DATA:  Stroke suspected, speech impediment, slurred speech EXAM: MRI HEAD WITHOUT CONTRAST TECHNIQUE: Multiplanar, multiecho pulse sequences of the brain and surrounding structures were obtained without intravenous contrast. COMPARISON:  No prior MRI available, correlation is made with CT head 01/28/2023 FINDINGS: Brain: No restricted diffusion to suggest acute or subacute infarct. No acute hemorrhage,  mass, mass effect, or midline shift. No hydrocephalus or extra-axial collection. Normal pituitary and craniocervical junction. No hemosiderin deposition to suggest remote hemorrhage. T2 hyperintense signal in the periventricular white matter, likely the sequela of mild chronic small vessel ischemic disease. Mildly advanced cerebral atrophy for age. Vascular: Normal arterial flow voids. Skull and upper cervical spine: Normal marrow signal. Sinuses/Orbits: Mucosal thickening in the ethmoid air cells. No acute finding in the orbits. Other: Partially imaged pharyngeal mass (series 4, image 1 and 6, image 13), which is better evaluated on  the same day CT neck. IMPRESSION: No acute intracranial process. No evidence of acute or subacute infarct. Electronically Signed   By: Wiliam Ke M.D.   On: 01/28/2023 22:08   CT Soft Tissue Neck W Contrast  Result Date: 01/28/2023 CLINICAL DATA:  Intermittent speech impairment, slurred speech EXAM: CT NECK WITH CONTRAST TECHNIQUE: Multidetector CT imaging of the neck was performed using the standard protocol following the bolus administration of intravenous contrast. RADIATION DOSE REDUCTION: This exam was performed according to the departmental dose-optimization program which includes automated exposure control, adjustment of the mA and/or kV according to patient size and/or use of iterative reconstruction technique. CONTRAST:  75mL OMNIPAQUE IOHEXOL 350 MG/ML SOLN COMPARISON:  None Available. FINDINGS: Pharynx and larynx: Enhancing mass in the right aspect of the pharynx, which measures approximately 3.3 x 3.1 x 5.0 cm (AP x TR x CC) (series 3, image 29 and series 6, image 43). The mass appears centered in the oropharynx near the right palatine tonsil, but extends into the hypopharynx more than into the nasopharynx. The larynx is unremarkable. Salivary glands: No inflammation, mass, or stone. Thyroid: Normal. Lymph nodes: Enlarged, abnormal lymph nodes bilaterally.  Representative lymph nodes include a left level 2A lymph node with central low density, which measures 2.1 x 2.5 x 3.4 cm (series 3, image 45 and series 6, image 45). A right level 2A lymph node also demonstrates central low density and measures 1.8 x 1.3 x 2.4 cm (series 3, image 39 and series 6, image 49). A rounded right level 1B lymph node measures up to 1.1 cm (series 3, image 41), with some central low density. Abnormally enhancing right level 3 lymph node measures 1.3 x 1.9 x 2.2 cm (series 3, image 60). Vascular: Patent.  Carotid and aortic atherosclerotic disease. Limited intracranial: Negative. Visualized orbits: Not included in the field of view. Mastoids and visualized paranasal sinuses: Left maxillary mucous retention cyst. Imaged mastoid air cells are clear. Skeleton: No acute fracture or suspicious osseous lesion. Status post ACDF C6-C7. Degenerative changes in the cervical spine. Upper chest: Negative. Other: None. IMPRESSION: 1. 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 ENT consultation is recommended. 2. Enlarged, abnormal lymph nodes bilaterally, with central low density, concerning for metastatic disease. 3. Aortic atherosclerosis. Aortic Atherosclerosis (ICD10-I70.0). Electronically Signed   By: Wiliam Ke M.D.   On: 01/28/2023 21:46   CT HEAD WO CONTRAST  Result Date: 01/28/2023 CLINICAL DATA:  Neuro deficit, acute, stroke suspected EXAM: CT HEAD WITHOUT CONTRAST TECHNIQUE: Contiguous axial images were obtained from the base of the skull through the vertex without intravenous contrast. RADIATION DOSE REDUCTION: This exam was performed according to the departmental dose-optimization program which includes automated exposure control, adjustment of the mA and/or kV according to patient size and/or use of iterative reconstruction technique. COMPARISON:  None Available. FINDINGS: Brain: No evidence of acute infarction, hemorrhage, hydrocephalus,  extra-axial collection or mass lesion/mass effect. Scattered low-density changes within the periventricular and subcortical white matter most compatible with chronic microvascular ischemic change. Mild diffuse cerebral volume loss. Vascular: Atherosclerotic calcifications involving the large vessels of the skull base. No unexpected hyperdense vessel. Skull: Normal. Negative for fracture or focal lesion. Sinuses/Orbits: No acute finding. Other: None. IMPRESSION: 1. No acute intracranial findings. 2. Chronic microvascular ischemic change and cerebral volume loss. Electronically Signed   By: Duanne Guess D.O.   On: 01/28/2023 13:29    Procedures .Critical Care  Performed by: Melene Plan, DO Authorized  by: Melene Plan, DO   Critical care provider statement:    Critical care time (minutes):  35   Critical care time was exclusive of:  Separately billable procedures and treating other patients   Critical care was time spent personally by me on the following activities:  Development of treatment plan with patient or surrogate, discussions with consultants, evaluation of patient's response to treatment, examination of patient, ordering and review of laboratory studies, ordering and review of radiographic studies, ordering and performing treatments and interventions, pulse oximetry, re-evaluation of patient's condition and review of old charts   Care discussed with: admitting provider       Medications Ordered in ED Medications  sodium chloride flush (NS) 0.9 % injection 3 mL (0 mLs Intravenous Hold 01/28/23 1809)  iohexol (OMNIPAQUE) 350 MG/ML injection 75 mL (75 mLs Intravenous Contrast Given 01/28/23 1919)    ED Course/ Medical Decision Making/ A&P                                 Medical Decision Making Amount and/or Complexity of Data Reviewed Labs: ordered. Radiology: ordered.  Risk Prescription drug management.   72 yo M with a chief complaint of slurred speech.  This been off and on  for about a week or 2.  Seems to come and go.  Patient is worried that he had a stroke.  Is here to be evaluated for the same.  On my exam it appears that the patient has a large tonsillar mass, I think this is likely the cause of the symptoms.  It has some characteristics that would concern me for cancer.  CT scan of the neck is concerning for a primary malignancy.  I discussed this with ENT on-call Dr. Irene Pap, she is able to see the patient in the office tomorrow.  I discussed this with the patient he plans to head over there in the morning.  MRI of the brain was negative for acute stroke.  Again I think that the patient slurred speech is due to his neck mass.  11:25 PM:  I have discussed the diagnosis/risks/treatment options with the patient.  Evaluation and diagnostic testing in the emergency department does not suggest an emergent condition requiring admission or immediate intervention beyond what has been performed at this time.  They will follow up with ENT. We also discussed returning to the ED immediately if new or worsening sx occur. We discussed the sx which are most concerning (e.g., sudden worsening pain, fever, inability to tolerate by mouth) that necessitate immediate return. Medications administered to the patient during their visit and any new prescriptions provided to the patient are listed below.  Medications given during this visit Medications  sodium chloride flush (NS) 0.9 % injection 3 mL (0 mLs Intravenous Hold 01/28/23 1809)  iohexol (OMNIPAQUE) 350 MG/ML injection 75 mL (75 mLs Intravenous Contrast Given 01/28/23 1919)     The patient appears reasonably screen and/or stabilized for discharge and I doubt any other medical condition or other [facility] requiring further screening, evaluation, or treatment in the ED at this time prior to discharge.          Final Clinical Impression(s) / ED Diagnoses Final diagnoses:  Tonsillar mass    Rx / DC Orders ED Discharge Orders      None         Melene Plan, DO 01/28/23 2325

## 2023-01-28 NOTE — ED Notes (Signed)
Patient transported to MRI 

## 2023-01-28 NOTE — ED Provider Triage Note (Signed)
Emergency Medicine Provider Triage Evaluation Note  Gregory Fisher , a 72 y.o. male  was evaluated in triage.  Pt complains of concerns for speech impediment x 1 week.  Notes that he got worse today.  He notes he feels like his speech is slurred.  Denies any new numbness or tingling.  Notes he has neuropathy.  Review of Systems  Positive:  Negative:   Physical Exam  BP 101/69   Pulse 78   Temp 97.6 F (36.4 C)   Resp 18   Ht 5' 6.5" (1.689 m)   Wt 127.5 kg   SpO2 98%   BMI 44.69 kg/m  Gen:   Awake, no distress   Resp:  Normal effort  MSK:   Moves extremities without difficulty  Other:  Negative pronator drift.  Able to ambulate without assistance or difficulty.  No focal neurological deficits noted on exam.  No spinal tenderness to palpation.  Medical Decision Making  Medically screening exam initiated at 5:12 PM.  Appropriate orders placed.  Hodges W Sedor was informed that the remainder of the evaluation will be completed by another provider, this initial triage assessment does not replace that evaluation, and the importance of remaining in the ED until their evaluation is complete.  Workup initiated   Bridey Brookover A, PA-C 01/28/23 2029

## 2023-01-29 ENCOUNTER — Other Ambulatory Visit: Payer: Self-pay

## 2023-01-29 ENCOUNTER — Encounter (INDEPENDENT_AMBULATORY_CARE_PROVIDER_SITE_OTHER): Payer: Self-pay | Admitting: Otolaryngology

## 2023-01-29 ENCOUNTER — Emergency Department (HOSPITAL_COMMUNITY)
Admission: EM | Admit: 2023-01-29 | Discharge: 2023-01-29 | Disposition: A | Discharge status: Home / Self Care | Payer: Medicare HMO | Source: Home / Self Care | Attending: Emergency Medicine | Admitting: Emergency Medicine

## 2023-01-29 ENCOUNTER — Ambulatory Visit (INDEPENDENT_AMBULATORY_CARE_PROVIDER_SITE_OTHER): Payer: Medicare HMO | Admitting: Otolaryngology

## 2023-01-29 ENCOUNTER — Emergency Department (HOSPITAL_COMMUNITY): Payer: Medicare HMO

## 2023-01-29 VITALS — BP 145/83 | HR 97 | Ht 66.5 in | Wt 281.0 lb

## 2023-01-29 DIAGNOSIS — I6502 Occlusion and stenosis of left vertebral artery: Secondary | ICD-10-CM | POA: Diagnosis not present

## 2023-01-29 DIAGNOSIS — R220 Localized swelling, mass and lump, head: Secondary | ICD-10-CM | POA: Diagnosis not present

## 2023-01-29 DIAGNOSIS — D3705 Neoplasm of uncertain behavior of pharynx: Secondary | ICD-10-CM

## 2023-01-29 DIAGNOSIS — R4781 Slurred speech: Secondary | ICD-10-CM

## 2023-01-29 DIAGNOSIS — Z79899 Other long term (current) drug therapy: Secondary | ICD-10-CM | POA: Insufficient documentation

## 2023-01-29 DIAGNOSIS — R221 Localized swelling, mass and lump, neck: Secondary | ICD-10-CM | POA: Diagnosis not present

## 2023-01-29 DIAGNOSIS — J392 Other diseases of pharynx: Secondary | ICD-10-CM | POA: Diagnosis not present

## 2023-01-29 DIAGNOSIS — Z7984 Long term (current) use of oral hypoglycemic drugs: Secondary | ICD-10-CM | POA: Insufficient documentation

## 2023-01-29 DIAGNOSIS — E119 Type 2 diabetes mellitus without complications: Secondary | ICD-10-CM | POA: Insufficient documentation

## 2023-01-29 DIAGNOSIS — C109 Malignant neoplasm of oropharynx, unspecified: Secondary | ICD-10-CM | POA: Diagnosis not present

## 2023-01-29 DIAGNOSIS — I10 Essential (primary) hypertension: Secondary | ICD-10-CM | POA: Insufficient documentation

## 2023-01-29 DIAGNOSIS — I6523 Occlusion and stenosis of bilateral carotid arteries: Secondary | ICD-10-CM | POA: Diagnosis not present

## 2023-01-29 LAB — I-STAT CHEM 8, ED
BUN: 9 mg/dL (ref 8–23)
Calcium, Ion: 1.18 mmol/L (ref 1.15–1.40)
Chloride: 103 mmol/L (ref 98–111)
Creatinine, Ser: 1 mg/dL (ref 0.61–1.24)
Glucose, Bld: 134 mg/dL — ABNORMAL HIGH (ref 70–99)
HCT: 42 % (ref 39.0–52.0)
Hemoglobin: 14.3 g/dL (ref 13.0–17.0)
Potassium: 3.6 mmol/L (ref 3.5–5.1)
Sodium: 140 mmol/L (ref 135–145)
TCO2: 24 mmol/L (ref 22–32)

## 2023-01-29 MED ORDER — IOHEXOL 350 MG/ML SOLN
75.0000 mL | Freq: Once | INTRAVENOUS | Status: AC | PRN
  Administered 2023-01-29: 75 mL via INTRAVENOUS

## 2023-01-29 MED ORDER — LORAZEPAM 1 MG PO TABS
1.0000 mg | ORAL_TABLET | Freq: Once | ORAL | Status: AC
  Administered 2023-01-29: 1 mg via ORAL
  Filled 2023-01-29: qty 1

## 2023-01-29 NOTE — Patient Instructions (Signed)
-   go to the ED at [facility] to get Neurology consultation and appropriate workup for stroke and hx of slurred speech - your intermittent slurred speech is not due to the mass effect of the right throat tumor - it can be due to tumor involvement of the vessels that provide blood supply to your brain and you need to be seen by Neurology - we will contact you when we have biopsy results and we want to see you back in about two weeks to discuss next steps

## 2023-01-29 NOTE — ED Notes (Signed)
Pt ambulating from bed to bathroom in room. Bed low & locked, call bell & personal items within reach

## 2023-01-29 NOTE — ED Notes (Signed)
Pt back from CT

## 2023-01-29 NOTE — ED Triage Notes (Signed)
Pt sent POV from ENT for neuro workup.  Pt was seen here yesterday and DC.  He saw an ENT this morning who sent him over here for further neurological workup after removing a mass from neck/throat that they are concerned may be malignant.  Pt had MRI of brain yesterday for slurred speech.

## 2023-01-29 NOTE — ED Notes (Signed)
Patient transported to CT 

## 2023-01-29 NOTE — ED Provider Notes (Signed)
[place] EM[facility]ENC[facility]MENT AT [facility] Provider Note   CSN: [id number] Arrival date & time: 01/29/23  0940     History  No chief complaint on file.   Gregory Fisher is a 72 y.o. male.  HPI 72 year old male presents from the ENT office with concern for possible TIA.  He has a previous history of hypertension, hyperlipidemia, diabetes.  For about 3 days he had slurred speech where his friend could not really understand him.  He presented yesterday for this and had a workup which included MRI as well as a CT head and a CT neck.  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  ENT did a biopsy and examined him today.  He feels like the slurred speech is gone and has been gone since he got to the ER yesterday.  Due to this, he was sent back to the ER for CT angio of his head and neck and possible neuroconsult given that ENT did not feel like the slurred speech was coming from his mass.  Patient feels like his speech is fine now.  When he describes the slurred speech, he describes it as slurring when intoxicated rather than having trouble getting the right words out.  He never had any weakness or numbness in his extremities.  Home Medications Prior to Admission medications   Medication Sig Start Date End Date Taking? Authorizing Provider  Accu-Chek Softclix Lancets lancets Use as instructed to check blood sugar 2 (times) a day 11/22/21   Eustaquio Boyden, MD  atorvastatin (LIPITOR) 80 MG tablet TAKE 1 TABLET BY MOUTH DAILY 06/05/22   Eustaquio Boyden, MD  Blood Glucose Monitoring Suppl (ACCU-CHEK GUIDE ME) w/Device KIT Use as instructed to check blood sugar 2 (times) a day 11/22/21   Eustaquio Boyden, MD  carvedilol (COREG) 12.5 MG tablet TAKE 1 TABLET BY MOUTH TWICE  DAILY WITH MEALS 06/06/22   Eustaquio Boyden, MD  Cholecalciferol (VITAMIN D3) 25 MCG (1000 UT) CAPS Take 2 capsules (2,000 Units total) by mouth daily. 05/05/18   Eustaquio Boyden, MD   cloNIDine (CATAPRES) 0.1 MG tablet TAKE 1 TABLET BY MOUTH TWICE  DAILY 06/05/22   Eustaquio Boyden, MD  dapagliflozin propanediol (FARXIGA) 5 MG TABS tablet Take 1 tablet (5 mg total) by mouth daily before breakfast. 05/09/22   Eustaquio Boyden, MD  furosemide (LASIX) 40 MG tablet Take 1 tablet (40 mg total) by mouth daily. 01/14/23   Eustaquio Boyden, MD  glucose blood (ACCU-CHEK GUIDE) test strip Use as instructed to check blood sugar 2 (times) a day 11/22/21   Eustaquio Boyden, MD  LORazepam (ATIVAN) 1 MG tablet TAKE 1 TABLET BY MOUTH EVERY DAY AS NEEDED FOR ANXIETY 01/14/23   Eustaquio Boyden, MD  metFORMIN (GLUCOPHAGE) 500 MG tablet TAKE 1 TABLET BY MOUTH TWICE  DAILY WITH MEALS 04/29/22   Eustaquio Boyden, MD  potassium chloride (KLOR-CON M10) 10 MEQ tablet Take 1 tablet (10 mEq total) by mouth 2 (two) times daily. 01/14/23   Eustaquio Boyden, MD  tamsulosin (FLOMAX) 0.4 MG CAPS capsule TAKE 2 CAPSULES BY MOUTH AT  BEDTIME 06/06/22   Eustaquio Boyden, MD  valsartan (DIOVAN) 320 MG tablet Take 0.5 tablets (160 mg total) by mouth daily. 04/09/22   Eustaquio Boyden, MD      Allergies    Patient has no known allergies.    Review of Systems   Review of Systems  Neurological:  Positive for speech difficulty. Negative for weakness, numbness and  headaches.    Physical Exam Updated Vital Signs BP (!) 154/98   Pulse 77   Temp 98 F (36.7 C) (Oral)   Resp 20   SpO2 99%  Physical Exam Vitals and nursing note reviewed.  Constitutional:      Appearance: He is well-developed. He is obese.  HENT:     Head: Normocephalic and atraumatic.     Mouth/Throat:     Comments: Right tonsillar mass. No acute bleeding Eyes:     Extraocular Movements: Extraocular movements intact.     Pupils: Pupils are equal, round, and reactive to light.  Cardiovascular:     Rate and Rhythm: Normal rate and regular rhythm.     Heart sounds: Normal heart sounds.  Pulmonary:     Effort: Pulmonary effort is  normal.     Breath sounds: Normal breath sounds.  Abdominal:     Palpations: Abdomen is soft.     Tenderness: There is no abdominal tenderness.  Skin:    General: Skin is warm and dry.  Neurological:     Mental Status: He is alert.     Comments: CN 3-12 grossly intact. 5/5 strength in all 4 extremities. Grossly normal sensation. Normal finger to nose.  To me he seems to have mildly slurred speech but he states this seems to be normal for him.     ED Results / Procedures / Treatments   Labs (all labs ordered are listed, but only abnormal results are displayed) Labs Reviewed  I-STAT CHEM 8, ED - Abnormal; Notable for the following components:      Result Value   Glucose, Bld 134 (*)    All other components within normal limits    EKG None  Radiology CT ANGIO HEAD NECK W WO CM  Result Date: 01/29/2023 CLINICAL DATA:  Stroke/TIA, determine embolic source EXAM: CT ANGIOGRAPHY HEAD AND NECK WITH AND WITHOUT CONTRAST TECHNIQUE: Multidetector CT imaging of the head and neck was performed using the standard protocol during bolus administration of intravenous contrast. Multiplanar CT image reconstructions and MIPs were obtained to evaluate the vascular anatomy. Carotid stenosis measurements (when applicable) are obtained utilizing NASCET criteria, using the distal internal carotid diameter as the denominator. RADIATION DOSE REDUCTION: This exam was performed according to the departmental dose-optimization program which includes automated exposure control, adjustment of the mA and/or kV according to patient size and/or use of iterative reconstruction technique. CONTRAST:  75mL OMNIPAQUE IOHEXOL 350 MG/ML SOLN COMPARISON:  01/28/2023 MRI and CT head. FINDINGS: CT HEAD FINDINGS Brain: No evidence of acute infarction, hemorrhage, hydrocephalus, extra-axial collection or mass lesion/mass effect. Vascular: See below. Skull: No acute fracture. Sinuses/Orbits: Mild scattered paranasal sinus mucosal  thickening and opacified posterior left ethmoid air cell. No acute orbital findings. Other: No mastoid effusions. Review of the MIP images confirms the above findings CTA NECK FINDINGS Aortic arch: Aortic atherosclerosis. Great vessel origins are patent. Right carotid system: Atherosclerosis at the carotid bifurcation without greater than 50% stenosis. Left carotid system: Atherosclerosis at the carotid bifurcation without greater than 50% stenosis. Tortuous ICA. Vertebral arteries: Left dominant. Patent bilaterally without significant (greater than 50%) stenosis. Mild proximal left V2 vertebral artery stenosis. Skeleton: Severe multilevel degenerative change. Lower cervical ACDF. Other neck: Right posterior oropharyngeal mass better characterized on recent CT neck. Upper chest: Visualized lung apices are clear. Review of the MIP images confirms the above findings CTA HEAD FINDINGS Anterior circulation: Bilateral intracranial ICAs, MCAs and ACAs are patent without proximal high-grade stenosis. Hypoplastic right A1  ACA, likely congenital. Posterior circulation: Bilateral intradural vertebral arteries, basilar artery and bilateral posterior cerebral arteries are patent without proximal high-grade stenosis. Venous sinuses: As permitted by contrast timing, patent. Anatomic variants: Detailed above. Review of the MIP images confirms the above findings IMPRESSION: 1. No large vessel occlusion or proximal high-grade stenosis. 2. Right posterior oropharyngeal mass better characterized on recent CT neck. Electronically Signed   By: Feliberto Harts M.D.   On: 01/29/2023 12:46   MR BRAIN WO CONTRAST  Result Date: 01/28/2023 CLINICAL DATA:  Stroke suspected, speech impediment, slurred speech EXAM: MRI HEAD WITHOUT CONTRAST TECHNIQUE: Multiplanar, multiecho pulse sequences of the brain and surrounding structures were obtained without intravenous contrast. COMPARISON:  No prior MRI available, correlation is made with CT  head 01/28/2023 FINDINGS: Brain: No restricted diffusion to suggest acute or subacute infarct. No acute hemorrhage, mass, mass effect, or midline shift. No hydrocephalus or extra-axial collection. Normal pituitary and craniocervical junction. No hemosiderin deposition to suggest remote hemorrhage. T2 hyperintense signal in the periventricular white matter, likely the sequela of mild chronic small vessel ischemic disease. Mildly advanced cerebral atrophy for age. Vascular: Normal arterial flow voids. Skull and upper cervical spine: Normal marrow signal. Sinuses/Orbits: Mucosal thickening in the ethmoid air cells. No acute finding in the orbits. Other: Partially imaged pharyngeal mass (series 4, image 1 and 6, image 13), which is better evaluated on the same day CT neck. IMPRESSION: No acute intracranial process. No evidence of acute or subacute infarct. Electronically Signed   By: Wiliam Ke M.D.   On: 01/28/2023 22:08   CT Soft Tissue Neck W Contrast  Result Date: 01/28/2023 CLINICAL DATA:  Intermittent speech impairment, slurred speech EXAM: CT NECK WITH CONTRAST TECHNIQUE: Multidetector CT imaging of the neck was performed using the standard protocol following the bolus administration of intravenous contrast. RADIATION DOSE REDUCTION: This exam was performed according to the departmental dose-optimization program which includes automated exposure control, adjustment of the mA and/or kV according to patient size and/or use of iterative reconstruction technique. CONTRAST:  75mL OMNIPAQUE IOHEXOL 350 MG/ML SOLN COMPARISON:  None Available. FINDINGS: Pharynx and larynx: Enhancing mass in the right aspect of the pharynx, which measures approximately 3.3 x 3.1 x 5.0 cm (AP x TR x CC) (series 3, image 29 and series 6, image 43). The mass appears centered in the oropharynx near the right palatine tonsil, but extends into the hypopharynx more than into the nasopharynx. The larynx is unremarkable. Salivary glands: No  inflammation, mass, or stone. Thyroid: Normal. Lymph nodes: Enlarged, abnormal lymph nodes bilaterally. Representative lymph nodes include a left level 2A lymph node with central low density, which measures 2.1 x 2.5 x 3.4 cm (series 3, image 45 and series 6, image 45). A right level 2A lymph node also demonstrates central low density and measures 1.8 x 1.3 x 2.4 cm (series 3, image 39 and series 6, image 49). A rounded right level 1B lymph node measures up to 1.1 cm (series 3, image 41), with some central low density. Abnormally enhancing right level 3 lymph node measures 1.3 x 1.9 x 2.2 cm (series 3, image 60). Vascular: Patent.  Carotid and aortic atherosclerotic disease. Limited intracranial: Negative. Visualized orbits: Not included in the field of view. Mastoids and visualized paranasal sinuses: Left maxillary mucous retention cyst. Imaged mastoid air cells are clear. Skeleton: No acute fracture or suspicious osseous lesion. Status post ACDF C6-C7. Degenerative changes in the cervical spine. Upper chest: Negative. Other: None. IMPRESSION: 1. 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 ENT consultation is recommended. 2. Enlarged, abnormal lymph nodes bilaterally, with central low density, concerning for metastatic disease. 3. Aortic atherosclerosis. Aortic Atherosclerosis (ICD10-I70.0). Electronically Signed   By: Wiliam Ke M.D.   On: 01/28/2023 21:46   CT HEAD WO CONTRAST  Result Date: 01/28/2023 CLINICAL DATA:  Neuro deficit, acute, stroke suspected EXAM: CT HEAD WITHOUT CONTRAST TECHNIQUE: Contiguous axial images were obtained from the base of the skull through the vertex without intravenous contrast. RADIATION DOSE REDUCTION: This exam was performed according to the departmental dose-optimization program which includes automated exposure control, adjustment of the mA and/or kV according to patient size and/or use of iterative reconstruction technique.  COMPARISON:  None Available. FINDINGS: Brain: No evidence of acute infarction, hemorrhage, hydrocephalus, extra-axial collection or mass lesion/mass effect. Scattered low-density changes within the periventricular and subcortical white matter most compatible with chronic microvascular ischemic change. Mild diffuse cerebral volume loss. Vascular: Atherosclerotic calcifications involving the large vessels of the skull base. No unexpected hyperdense vessel. Skull: Normal. Negative for fracture or focal lesion. Sinuses/Orbits: No acute finding. Other: None. IMPRESSION: 1. No acute intracranial findings. 2. Chronic microvascular ischemic change and cerebral volume loss. Electronically Signed   By: Duanne Guess D.O.   On: 01/28/2023 13:29    Procedures Procedures    Medications Ordered in ED Medications  LORazepam (ATIVAN) tablet 1 mg (1 mg Oral Given 01/29/23 1146)  iohexol (OMNIPAQUE) 350 MG/ML injection 75 mL (75 mLs Intravenous Contrast Given 01/29/23 1207)    ED Course/ Medical Decision Making/ A&P                                 Medical Decision Making Amount and/or Complexity of Data Reviewed External Data Reviewed: notes.    Details: ENT note from this morning Labs: ordered.    Details: No significant electrolyte disturbance Radiology: ordered and independent interpretation performed.    Details: No carotid occlusion/stenosis  Risk Prescription drug management.   She was sent in due to the slurred speech.  However he has not had it in about 24 hours or so.  He states he did not have any symptoms when he was getting the MRI yesterday evening.  He has no new symptoms today, just was referred by ENT.  I did a CTA which is overall unremarkable.  I did discuss with neurology, Dr. Otelia Limes.  This could be an outpatient workup for him.  Given no new symptoms, no indication for any emergent treatment or workup and will give urgent outpatient neuro follow-up.  Discussed strict return  precautions with the patient.        Final Clinical Impression(s) / ED Diagnoses Final diagnoses:  Slurred speech    Rx / DC Orders ED Discharge Orders          Ordered    Ambulatory referral to Neurology       Comments: An appointment is requested in approximately: 1 week   01/29/23 1318              Pricilla Loveless, MD 01/29/23 1324
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
## 2023-01-29 NOTE — Discharge Instructions (Signed)
Follow-up with neurology for your slurred speech.  Also follow-up with your primary care physician.  If you develop headache, recurrent slurred speech, facial droop, numbness or weakness, or any other new/concerning symptoms then return to the ER or call 911.

## 2023-01-30 ENCOUNTER — Encounter: Payer: Self-pay | Admitting: Neurology

## 2023-01-30 ENCOUNTER — Ambulatory Visit: Payer: Medicare HMO | Admitting: Family Medicine

## 2023-02-02 ENCOUNTER — Telehealth (INDEPENDENT_AMBULATORY_CARE_PROVIDER_SITE_OTHER): Payer: Self-pay | Admitting: Otolaryngology

## 2023-02-02 NOTE — Telephone Encounter (Signed)
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 Also let the patient know that he will be referred to have PET/CT, and to see Rad/Onc, Heme/Onc and Head and Neck Oncology at [facility] for consultation.

## 2023-02-03 ENCOUNTER — Other Ambulatory Visit (INDEPENDENT_AMBULATORY_CARE_PROVIDER_SITE_OTHER): Payer: Self-pay | Admitting: Physician Assistant

## 2023-02-03 ENCOUNTER — Telehealth: Payer: Self-pay | Admitting: *Deleted

## 2023-02-03 DIAGNOSIS — C109 Malignant neoplasm of oropharynx, unspecified: Secondary | ICD-10-CM

## 2023-02-03 DIAGNOSIS — J392 Other diseases of pharynx: Secondary | ICD-10-CM

## 2023-02-03 NOTE — Progress Notes (Signed)
  Care Coordination   Note   02/03/2023 Name: Gregory Fisher MRN: [id number] DOB: June 29, 1950  Gregory Fisher is a 72 y.o. year old male who sees Eustaquio Boyden, MD for primary care. I reached out to [employer] by phone today to offer care coordination services.  [name] was given information about Care Coordination services today including:   The Care Coordination services include support from the care team which includes your Nurse Coordinator, Clinical Social Worker, or Pharmacist.  The Care Coordination team is here to help remove barriers to the health concerns and goals most important to you. Care Coordination services are voluntary, and the patient may decline or stop services at any time by request to their care team member.   Care Coordination Consent Status: Patient agreed to services and verbal consent obtained.   Follow up plan:  Telephone appointment with care coordination team member scheduled for:  02/11/2023  Encounter Outcome:  Pt. Scheduled  Burman Nieves, CCMA Care Coordination Care Guide Direct Dial: [phone]

## 2023-02-04 LAB — SURGICAL PATHOLOGY

## 2023-02-05 ENCOUNTER — Telehealth: Payer: Self-pay

## 2023-02-05 NOTE — Telephone Encounter (Signed)
PET scan CPT code 82956 authorization #[id number] good from 08/29 to 04/06/2023

## 2023-02-06 ENCOUNTER — Telehealth: Payer: Self-pay | Admitting: Radiation Oncology

## 2023-02-06 NOTE — Progress Notes (Signed)
Oncology Nurse Navigator Documentation   I called Mr. Giard today to inform him of his appointment for his PET scan on 02/20/23. When speaking to him he indicated that it is very difficult for him to get to [facility] for appointments because he relies on friends to drive him. He told me that he would prefer to see Radiation and Medical oncology providers close to his home in [place], [place]. I have notified the schedulers here at [facility] and his appointments here have been cancelled. He has been referred to Medical oncology at [facility] and Radiation oncology at [facility] per his wishes. I have also notified Dr. Irene Pap, his ENT physician. He is willing come here for his PET on 9/13 as scheduled at this time. I will follow to make sure he is scheduled in a timely manner.   Hedda Slade RN, BSN, OCN Head & Neck Oncology Nurse Navigator [facility] at [facility] Phone # [phone]  Fax # [phone]

## 2023-02-06 NOTE — Telephone Encounter (Signed)
Per Hedda Slade the patient prefers to receive TX in [place], Dr. Curlene Dolphin office notified. Closing referral until further notice.

## 2023-02-11 ENCOUNTER — Telehealth: Payer: Self-pay

## 2023-02-11 ENCOUNTER — Ambulatory Visit: Payer: Self-pay | Admitting: *Deleted

## 2023-02-11 NOTE — Telephone Encounter (Signed)
Transition Care Management Unsuccessful Follow-up Telephone Call  Date of discharge and from where:  [facility] 8/22  Attempts:  1st Attempt  Reason for unsuccessful TCM follow-up call:  No answer/busy   Lenard Forth [place]  [facility], [facility] Guide, Phone: [phone] Website: [email or web address]

## 2023-02-11 NOTE — Telephone Encounter (Signed)
Transition Care Management Unsuccessful Follow-up Telephone Call  Date of discharge and from where:  Gregory Fisher 8/22  Attempts:  2nd Attempt  Reason for unsuccessful TCM follow-up call:  No answer/busy   Gregory Fisher  [facility] Guide, Phone: [phone] Website: [email or web address]

## 2023-02-12 ENCOUNTER — Ambulatory Visit (INDEPENDENT_AMBULATORY_CARE_PROVIDER_SITE_OTHER): Payer: Medicare HMO | Admitting: Otolaryngology

## 2023-02-12 NOTE — Patient Instructions (Signed)
Visit Information  Thank you for taking time to visit with me today. Please don't hesitate to contact me if I can be of assistance to you.   Following are the goals we discussed today:  Patient to keep a calendar of dates and times of all medical appointments Patient to contact his providers with any questions regarding his medical call CSW to remain available for any community resource needs that may arise   Our next appointment is by telephone on 02/25/23 at 1pm  Please call the care guide team at [phone] if you need to cancel or reschedule your appointment.   If you are experiencing a Mental Health or Behavioral Health Crisis or need someone to talk to, please call 911   Patient verbalizes understanding of instructions and care plan provided today and agrees to view in MyChart. Active MyChart status and patient understanding of how to access instructions and care plan via MyChart confirmed with patient.     Telephone follow up appointment with care management team member scheduled for: 02/25/23   Verna Czech, LCSW [facility], [facility] Licensed Clinical Social Worker Care Coordinator  Direct Dial: [phone]

## 2023-02-12 NOTE — Patient Outreach (Addendum)
  Care Coordination   Initial Visit Note   02/12/2023 Name: Gregory Fisher MRN: [id number] DOB: 11/03/1950  Gregory Fisher is a 72 y.o. year old male who sees Eustaquio Boyden, MD for primary care. I spoke with  Myles Lipps by phone today.  What matters to the patients health and wellness today?  Follow up with his providers to manage his new diagnosis    Goals Addressed             This Visit's Progress    Care coordination activities       Interventions Today    Flowsheet Row Most Recent Value  Chronic Disease   Chronic disease during today's visit Other  [pharyngeal mass]  General Interventions   General Interventions Discussed/Reviewed General Interventions Discussed, [facility], Doctor Visits  [status of patient's medical condition discussed, patient assessed for community resource needs]  Doctor Visits Discussed/Reviewed Doctor Visits Reviewed  [encouraged follow up-confirmed that patient's roommate to provide transportation-patient to obtain a calendar to help organize all upcoming dates of medical appointments]  Mental Health Interventions   Mental Health Discussed/Reviewed Mental Health Discussed, Coping Strategies, Depression  [explored patient's feelings regarding new diagnosis, reflective listening-emotional support provided, confirmed that patient's has a postive network of support, continued encouragement to reach out and accept assistance when needed]              SDOH assessments and interventions completed:  Yes  SDOH Interventions Today    Flowsheet Row Most Recent Value  SDOH Interventions   Food Insecurity Interventions Intervention Not Indicated  Housing Interventions Intervention Not Indicated  Transportation Interventions Intervention Not Indicated        Care Coordination Interventions:  Yes, provided   Follow up plan: Follow up call scheduled for 02/25/23    Encounter Outcome:  Patient Visit Completed

## 2023-02-13 ENCOUNTER — Other Ambulatory Visit: Payer: Self-pay | Admitting: Family Medicine

## 2023-02-13 DIAGNOSIS — F39 Unspecified mood [affective] disorder: Secondary | ICD-10-CM

## 2023-02-13 DIAGNOSIS — I10 Essential (primary) hypertension: Secondary | ICD-10-CM

## 2023-02-13 NOTE — Telephone Encounter (Signed)
Plz schedule CPE and fasting lab (no food/drink- except water and/or blk coffee 5 hrs prior) visits for additional refills.  Then send request back to [facility] for refill .

## 2023-02-16 NOTE — Telephone Encounter (Signed)
Lvm for patient tcb and schedule 

## 2023-02-17 NOTE — Telephone Encounter (Signed)
Spoke with patient and he said that he was just diagnosed with cancer and he will be starting chemo soon,so he's not sure when he will be able to come in for a cpe.

## 2023-02-18 NOTE — Telephone Encounter (Signed)
Name of Medication: Lorazepam Name of Pharmacy: [facility] Last Fill or Written Date and Quantity: 01/14/23, #20 Last Office Visit and Type: 04/09/22, 2 wk f/u Next Office Visit and Type: none Last Controlled Substance Agreement Date: none Last UDS:  none   Lasix last filled: 11/19/22, #30 Klor-con last filled:  11/19/22, #60

## 2023-02-19 NOTE — Telephone Encounter (Signed)
I'm sorry to hear about recent cancer diagnosis. I have refilled below meds.  I do need to see him in office however to continue filling lorazepam controlled substance.   While I am happy to continue seeing him, If he is having a lot of difficulty coming into office due to upcoming cancer treatments or difficulty with transportation, he may need to establish with a PCP closer to where he lives in [place] or [place]. This may also be better for continuity of his medical care. If he'd like our assistance in finding more local PCP to him, to let us know and we can help him.   Will forward to Amy to see if she can touch base with patient regarding this.

## 2023-02-20 ENCOUNTER — Encounter (HOSPITAL_COMMUNITY): Payer: Self-pay

## 2023-02-20 ENCOUNTER — Encounter (HOSPITAL_COMMUNITY)
Admission: RE | Admit: 2023-02-20 | Discharge: 2023-02-20 | Disposition: A | Discharge status: Home / Self Care | Payer: Medicare HMO | Source: Ambulatory Visit | Attending: Physician Assistant | Admitting: Physician Assistant

## 2023-02-20 DIAGNOSIS — C109 Malignant neoplasm of oropharynx, unspecified: Secondary | ICD-10-CM | POA: Insufficient documentation

## 2023-02-23 ENCOUNTER — Ambulatory Visit: Payer: Medicare HMO | Admitting: Hematology and Oncology

## 2023-02-23 ENCOUNTER — Other Ambulatory Visit: Payer: Medicare HMO

## 2023-02-24 DIAGNOSIS — C108 Malignant neoplasm of overlapping sites of oropharynx: Secondary | ICD-10-CM | POA: Diagnosis not present

## 2023-02-24 DIAGNOSIS — J359 Chronic disease of tonsils and adenoids, unspecified: Secondary | ICD-10-CM | POA: Diagnosis not present

## 2023-02-24 DIAGNOSIS — C770 Secondary and unspecified malignant neoplasm of lymph nodes of head, face and neck: Secondary | ICD-10-CM | POA: Diagnosis not present

## 2023-02-25 ENCOUNTER — Ambulatory Visit: Payer: Self-pay | Admitting: *Deleted

## 2023-02-25 NOTE — Patient Outreach (Signed)
Care Coordination   Follow Up Visit Note   02/25/2023 Name: Gregory Fisher MRN: [id number] DOB: [birth year]  Gregory Fisher is a 72 y.o. year old male who sees Eustaquio Boyden, MD for primary care. I spoke with  Gregory Fisher by phone today.  What matters to the patients health and wellness today?  Patient currently working with his medical providers to develop a treatment plan to manage his condition. CSW to continue to provide emotional support related to the management of his new diagnosis.   Goals Addressed             This Visit's Progress    Care coordination activities       Interventions Today    Flowsheet Row Most Recent Value  Chronic Disease   Chronic disease during today's visit --  [pharyngeal mass]  General Interventions   General Interventions Discussed/Reviewed Doctor Visits  Doctor Visits Discussed/Reviewed Doctor Visits Discussed  [9/19 PET Scan-treatment to be determined post PET scan-anticipated need for chemotherapy and radiation]  Mental Health Interventions   Mental Health Discussed/Reviewed Mental Health Reviewed, Coping Strategies  [Encouraged verbalization of feelings related to recent diagnosis, reinforced practice of deep breathing excercises/focusing on the present,utilizing postive supports and active involvement in outside activities]              SDOH assessments and interventions completed:  No     Care Coordination Interventions:  Yes, provided   Follow up plan: Follow up call scheduled for 03/11/23    Encounter Outcome:  Patient Visit Completed
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
## 2023-02-25 NOTE — Patient Instructions (Addendum)
Visit Information  Thank you for taking time to visit with me today. Please don't hesitate to contact me if I can be of assistance to you.   Following are the goals we discussed today:  Please continue to utilize coping strategies discussed today to assist with anxiety management  Please continue to contact your medical provider with questions regarding your medical care    Our next appointment is by telephone on 03/11/23 at 2pm  Please call the care guide team at [phone] if you need to cancel or reschedule your appointment.   If you are experiencing a Mental Health or Behavioral Health Crisis or need someone to talk to, please call the Suicide and Crisis Lifeline: 988   Patient verbalizes understanding of instructions and care plan provided today and agrees to view in MyChart. Active MyChart status and patient understanding of how to access instructions and care plan via MyChart confirmed with patient.     Telephone follow up appointment with care management team member scheduled for: 03/11/23  Verna Czech, LCSW [place]  [facility] Licensed Clinical Social Worker Care Coordinator  Direct Dial: [phone]

## 2023-02-26 ENCOUNTER — Inpatient Hospital Stay: Payer: Medicare HMO | Admitting: Licensed Clinical Social Worker

## 2023-02-26 ENCOUNTER — Inpatient Hospital Stay: Payer: Medicare HMO | Admitting: Dietician

## 2023-02-26 ENCOUNTER — Encounter: Payer: Self-pay | Admitting: Oncology

## 2023-02-26 ENCOUNTER — Other Ambulatory Visit: Payer: Self-pay | Admitting: Oncology

## 2023-02-26 ENCOUNTER — Inpatient Hospital Stay: Payer: Medicare HMO | Attending: Oncology | Admitting: Oncology

## 2023-02-26 DIAGNOSIS — R634 Abnormal weight loss: Secondary | ICD-10-CM | POA: Diagnosis not present

## 2023-02-26 DIAGNOSIS — F329 Major depressive disorder, single episode, unspecified: Secondary | ICD-10-CM

## 2023-02-26 DIAGNOSIS — C109 Malignant neoplasm of oropharynx, unspecified: Secondary | ICD-10-CM | POA: Insufficient documentation

## 2023-02-26 DIAGNOSIS — F32A Depression, unspecified: Secondary | ICD-10-CM | POA: Diagnosis not present

## 2023-02-26 DIAGNOSIS — G893 Neoplasm related pain (acute) (chronic): Secondary | ICD-10-CM | POA: Insufficient documentation

## 2023-02-26 NOTE — Patient Instructions (Addendum)
[facility]  Discharge Instructions  You were seen and examined today by Dr. Anders Simmonds. Dr. Anders Simmonds is a medical oncologist, meaning that he specializes in the treatment of cancer diagnoses. Dr. Anders Simmonds discussed your past medical history, family history of cancers, and the events that led to you being here today.  You were referred to Dr. Anders Simmonds due to a new diagnosis of head and neck cancer, which has spread to the lymph nodes. This makes it a Stage IV cancer, but it can be cured with appropriate treatment.  You will be treated for your cancer with chemotherapy and radiation therapy.  Prior to starting treatment, you will need a few things: PET scan. This is a whole body CT scan which ensures there is no spread of the cancer to other areas and organs within your body. Port-A-Cath. This is placed so we can safely administer chemotherapy. PEG tube. This is the feeding tube to ensure you maintain adequate nutrition during treatment. You will also be referred for a speech therapy evaluation.  Chemotherapy and radiation therapy will start on the same day.  Follow-up as scheduled.  Thank you for choosing [facility] to provide your oncology and hematology care.   To afford each patient quality time with our provider, please arrive at least 15 minutes before your scheduled appointment time. You may need to reschedule your appointment if you arrive late (10 or more minutes). Arriving late affects you and other patients whose appointments are after yours.  Also, if you miss three or more appointments without notifying the office, you may be dismissed from the clinic at the provider's discretion.    Again, thank you for choosing [facility].  Our hope is that these requests will decrease the amount of time that you wait before being seen by our physicians.   If you have a lab appointment with the Cancer Center - please note that after  April 8th, all labs will be drawn in the cancer center.  You do not have to check in or register with the main entrance as you have in the past but will complete your check-in at the cancer center.            _____________________________________________________________  Should you have questions after your visit to [facility], please contact our office at [phone] and follow the prompts.  Our office hours are 8:00 a.m. to 4:30 p.m. Monday - Thursday and 8:00 a.m. to 2:30 p.m. Friday.  Please note that voicemails left after 4:00 p.m. may not be returned until the following business day.  We are closed weekends and all major holidays.  You do have access to a nurse 24-7, just call the main number to the clinic [phone] and do not press any options, hold on the line and a nurse will answer the phone.    For prescription refill requests, have your pharmacy contact our office and allow 72 hours.    Masks are no longer required in the cancer centers. If you would like for your care team to wear a mask while they are taking care of you, please let them know. You may have one support person who is at least 72 years old accompany you for your appointments.

## 2023-02-26 NOTE — Assessment & Plan Note (Addendum)
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  Patient met with radiation oncology earlier this week, planning to get simulated.  Discussed the role of chemotherapy is for radiosensitization.  Discussed chemotherapy options including weekly cisplatin or carboplatin/Taxol.  Based on patient's physical capacity would consider CarboTaxol for him.  Will coordinate with radiation oncology. -Will get a port placed -Discussed the role of PEG tube for nutritional support during chemo RT.  Will place a referral and consult nutritionist -Will consult speech therapy -Will do chemo education closer to start date -Will send NGS testing on the tissue  RTC in 3 weeks to start chemotherapy

## 2023-02-26 NOTE — Progress Notes (Signed)
Nutrition Assessment   Reason for Assessment: New HNC   ASSESSMENT: 72 year old male with newly diagnosed oropharyngeal cancer. Currently pending PET (9/26). Treatment plan underway. Pt is under the care of Dr. Anders Simmonds.   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 He is feeling overwhelmed with diagnosis and treatment plan s/p discussion with MD and nurse navigator. Patient tolerating a regular diet and denies swallowing difficulties at this time. He endorses food insecurity. Reports typically not eating breakfast. Pt and roommate usually go to the food bank for a hot lunch plate during the week. Had chicken, potatoes, greens, and "some kind of dessert" yesterday. Pt receives monthly box of food from the food bank. Roommate normally does the cooking. They have pinto beans for supper often.    Nutrition Focused Physical Exam: deferred    Medications: lipitor, coreg, D3, clonidine, farxiga, lasix 40 mg, klor-con, ativan, metformin, flomax, valsartan   Labs: 8/21 labs reviewed    Anthropometrics:   Height: 5'6.5" Weight: 281 lb UBW: 285-290 BMI: 44.68   Estimated Energy Needs  Kcals: 2550-2800 Protein: 140-155 Fluid: >/=2.5 L   NUTRITION DIAGNOSIS: Inadequate oral intake related to food insecurity and newly diagnosed HNC as evidenced by pt reported food bank assistance, dietary recall insufficient to meet >75% of estimated needs  INTERVENTION:  Educated on importance of adequate calories and protein to maintain strength/weights during treatment Encouraged high protein snacks as able Briefly discussed feeding tube and rationale for prophylactic placement Pt will receive PEG care/bolus feeding education - pt agreeable to requested roommate presence at this visit Bag of food + toiletries from [employer] provided today Samples of  Ensure provided Discussed need for weekly in person f/u with pt during course of treatment PEG planned 9/30 - RN navigator to provide initial PEG care education and dressing change 10/1 Support and encouragement    MONITORING, EVALUATION, GOAL: Patient will tolerate increased calories and protein to minimize wt loss during treatment    Next Visit: Thursday October 3

## 2023-02-26 NOTE — Progress Notes (Signed)
START ON PATHWAY REGIMEN - Head and Neck     A cycle is every 7 days, concurrent with RT:     Paclitaxel      Carboplatin   **Always confirm dose/schedule in your pharmacy ordering system**  Patient Characteristics: Oropharynx, HPV Positive, Preoperative or Nonsurgical Candidate (Clinical Staging), cT0-4, cN1-3 or cT3-4, cN0 Disease Classification: Oropharynx HPV Status: Positive (+) Therapeutic Status: Preoperative or Nonsurgical Candidate (Clinical Staging) AJCC T Category: cT4 AJCC 8 Stage Grouping: III AJCC N Category: cNX AJCC M Category: cM0 Intent of Therapy: Curative Intent, Discussed with Patient

## 2023-02-26 NOTE — Assessment & Plan Note (Signed)
-  Patient stated that he sometimes feel like he should go into the woods and die -Does not have active suicidal intentions but feels depressed and does not want to do a lot -Refused psychiatry referral at this time -Will consult social worker for one-on-one counseling.  Patient is agreeable to that -Emotional support provided during the visit
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
## 2023-02-26 NOTE — Patient Instructions (Addendum)
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 You will be treated weekly in the clinic with a combination of chemotherapy drugs. Those drugs are paclitaxel (Taxol) and carboplatin. Treatment is given in conjunction with radiation therapy. Radiation therapy is given 5 days a week. Chemotherapy treatment is given in our clinic once a week. The intent of treatment is cure. You will see the doctor regularly throughout treatment.  We will obtain blood work from you prior to every treatment and monitor your results to make sure it is safe to give your treatment. The doctor monitors your response to treatment by the way you are feeling, your blood work, and by obtaining scans periodically.  There will be wait times while you are here for treatment.  It will take about 30 minutes to 1 hour for your lab work to result. Then there will be wait times while pharmacy mixes your medications.    Medications you will receive in the clinic prior to your chemotherapy medications:   Aloxi:  ALOXI is used in adults to help prevent the nausea and vomiting that happens with certain chemotherapy drugs.  Aloxi is a long acting medication, and will remain in your system for about 2 days.    Dexamethasone:  This is a steroid given prior to chemotherapy to help prevent allergic reactions; it may also help prevent and control nausea and diarrhea.    Pepcid:  This medication is a histamine blocker that helps prevent and allergic reaction to your chemotherapy.    Benadryl:  This is a histamine blocker (different from the Pepcid) that helps prevent allergic/infusion reactions to your chemotherapy. This medication may cause dizziness/drowsiness.      Paclitaxel (Taxol)  About This Drug Paclitaxel is a drug used to treat cancer. It is given in the vein (IV).  This will take 1 hour to infuse.  This first infusion will take longer to infuse because it is  increased slowly to monitor for reactions.  The nurse will be in the room with you for the first 15 minutes of the first infusion.  Possible Side Effects   Hair loss. Hair loss is often temporary, although with certain medicine, hair loss can sometimes be permanent. Hair loss may happen suddenly or gradually. If you lose hair, you may lose it from your head, face, armpits, pubic area, chest, and/or legs. You may also notice your hair getting thin.   Swelling of your legs, ankles and/or feet (edema)   Flushing   Nausea and throwing up (vomiting)   Loose bowel movements (diarrhea)   Bone marrow depression. This is a decrease in the number of white blood cells, red blood cells, and platelets. This may raise your risk of infection, make you tired and weak (fatigue), and raise your risk of bleeding.   Effects on the nerves are called peripheral neuropathy. You may feel numbness, tingling, or pain in your hands and feet. It may be hard for you to button your clothes, open jars, or walk as usual. The effect on the nerves may get worse with more doses of the drug. These effects get better in some people after the drug is stopped but it does not get better in all people.   Changes in your liver function   Bone, joint and muscle pain   Abnormal EKG   Allergic reaction: Allergic reactions, including anaphylaxis are rare but may happen in some patients. Signs of allergic reaction to  this drug may be swelling of the face, feeling like your tongue or throat are swelling, trouble breathing, rash, itching, fever, chills, feeling dizzy, and/or feeling that your heart is beating in a fast or not normal way. If this happens, do not take another dose of this drug. You should get urgent medical treatment.   Infection   Changes in your kidney function.  Note: Each of the side effects above was reported in 20% or greater of patients treated with paclitaxel. Not all possible side effects are included  above.  Warnings and Precautions   Severe allergic reactions   Severe bone marrow depression  Treating Side Effects   To help with hair loss, wash with a mild shampoo and avoid washing your hair every day.   Avoid rubbing your scalp, instead, pat your hair or scalp dry   Avoid coloring your hair   Limit your use of hair spray, electric curlers, blow dryers, and curling irons.   If you are interested in getting a wig, talk to your nurse. You can also call the [employer] at 800-ACS-2345 to find out information about the "Look Good, Feel Better" program close to where you live. It is a free program where women getting chemotherapy can learn about wigs, turbans and scarves as well as makeup techniques and skin and nail care.   Ask your doctor or nurse about medicines that are available to help stop or lessen diarrhea and/or nausea.   To help with nausea and vomiting, eat small, frequent meals instead of three large meals a day. Choose foods and drinks that are at room temperature. Ask your nurse or doctor about other helpful tips and medicine that is available to help or stop lessen these symptoms.   If you get diarrhea, eat low-fiber foods that are high in protein and calories and avoid foods that can irritate your digestive tracts or lead to cramping. Ask your nurse or doctor about medicine that can lessen or stop your diarrhea.   Mouth care is very important. Your mouth care should consist of routine, gentle cleaning of your teeth or dentures and rinsing your mouth with a mixture of 1/2 teaspoon of salt in 8 ounces of water or  teaspoon of baking soda in 8 ounces of water. This should be done at least after each meal and at bedtime.   If you have mouth sores, avoid mouthwash that has alcohol. Also avoid alcohol and smoking because they can bother your mouth and throat.   Drink plenty of fluids (a minimum of eight glasses per day is recommended).   Take your temperature  as your doctor or nurse tells you, and whenever you feel like you may have a fever.   Talk to your doctor or nurse about precautions you can take to avoid infections and bleeding.   Be careful when cooking, walking, and handling sharp objects and hot liquids.  Food and Drug Interactions   There are no known interactions of paclitaxel with food.   This drug may interact with other medicines. Tell your doctor and pharmacist about all the medicines and dietary supplements (vitamins, minerals, herbs and others) that you are taking at this time.   The safety and use of dietary supplements and alternative diets are often not known. Using these might affect your cancer or interfere with your treatment. Until more is known, you should not use dietary supplements or alternative diets without your cancer doctor's help.  When to Call the Doctor  Call your doctor or nurse if you have any of the following symptoms and/or any new or unusual symptoms:   Fever of 100.4 F (38 C) or above   Chills   Redness, pain, warmth, or swelling at the IV site during the infusion   Signs of allergic reaction: swelling of the face, feeling like your tongue or throat are swelling, trouble breathing, rash, itching, fever, chills, feeling dizzy, and/or feeling that your heart is beating in a fast or not normal way   Feeling that your heart is beating in a fast or not normal way (palpitations)   Weight gain of 5 pounds in one week (fluid retention)   Decreased urine or very dark urine   Signs of liver problems: dark urine, pale bowel movements, bad stomach pain, feeling very tired and weak, unusual  itching, or yellowing of the eyes or skin   Heavy menstrual period that lasts longer than normal   Easy bruising or bleeding   Nausea that stops you from eating or drinking, and/or that is not relieved by prescribed medicines.   Loose bowel movements (diarrhea) more than 4 times a day or diarrhea with weakness or  lightheadedness   Pain in your mouth or throat that makes it hard to eat or drink   Lasting loss of appetite or rapid weight loss of five pounds in a week   Signs of peripheral neuropathy: numbness, tingling, or decreased feeling in fingers or toes; trouble walking or changes in the way you walk; or feeling clumsy when buttoning clothes, opening jars, or other routine activities   Joint and muscle pain that is not relieved by prescribed medicines   Extreme fatigue that interferes with normal activities   While you are getting this drug, please tell your nurse right away if you have any pain, redness, or swelling at the site of the IV infusion.   If you think you are pregnant.  Reproduction Warnings   Pregnancy warning: This drug may have harmful effects on the unborn child, it is recommended that effective methods of birth control should be used during your cancer treatment. Let your doctor know right away if you think you may be pregnant.   Breast feeding warning: Women should not breast feed during treatment because this drug could enter the breastmilk and cause harm to a breast feeding baby.   Carboplatin (Paraplatin, CBDCA)  About This Drug  Carboplatin is used to treat cancer. It is given in the vein (IV).  It will take 30 minutes to infuse.   Possible Side Effects   Bone marrow suppression. This is a decrease in the number of white blood cells, red blood cells, and platelets. This may raise your risk of infection, make you tired and weak (fatigue), and raise your risk of bleeding.   Nausea and vomiting (throwing up)   Weakness   Changes in your liver function   Changes in your kidney function   Electrolyte changes   Pain  Note: Each of the side effects above was reported in 20% or greater of patients treated with carboplatin. Not all possible side effects are included above.   Warnings and Precautions   Severe bone marrow suppression   Allergic reactions,  including anaphylaxis are rare but may happen in some patients. Signs of allergic reaction to this drug may be swelling of the face, feeling like your tongue or throat are swelling, trouble breathing, rash, itching, fever, chills, feeling dizzy, and/or feeling that your heart is beating  in a fast or not normal way. If this happens, do not take another dose of this drug. You should get urgent medical treatment.   Severe nausea and vomiting   Effects on the nerves are called peripheral neuropathy. This risk is increased if you are over the age of 25 or if you have received other medicine with risk of peripheral neuropathy. You may feel numbness, tingling, or pain in your hands and feet. It may be hard for you to button your clothes, open jars, or walk as usual. The effect on the nerves may get worse with more doses of the drug. These effects get better in some people after the drug is stopped but it does not get better in all people.   Blurred vision, loss of vision or other changes in eyesight   Decreased hearing   - Skin and tissue irritation including redness, pain, warmth, or swelling at the IV site if the drug leaks out of the vein and into nearby tissue.   Severe changes in your kidney function, which can cause kidney failure   Severe changes in your liver function, which can cause liver failure  Note: Some of the side effects above are very rare. If you have concerns and/or questions, please discuss them with your medical team.   Important Information   This drug may be present in the saliva, tears, sweat, urine, stool, vomit, semen, and vaginal secretions. Talk to your doctor and/or your nurse about the necessary precautions to take during this time.   Treating Side Effects   Manage tiredness by pacing your activities for the day.   Be sure to include periods of rest between energy-draining activities.   To decrease the risk of infection, wash your hands regularly.   Avoid close  contact with people who have a cold, the flu, or other infections.   Take your temperature as your doctor or nurse tells you, and whenever you feel like you may have a fever.   To help decrease the risk of bleeding, use a soft toothbrush. Check with your nurse before using dental floss.   Be very careful when using knives or tools.   Use an electric shaver instead of a razor.   Drink plenty of fluids (a minimum of eight glasses per day is recommended).   If you throw up or have loose bowel movements, you should drink more fluids so that you do not become dehydrated (lack of water in the body from losing too much fluid).   To help with nausea and vomiting, eat small, frequent meals instead of three large meals a day. Choose foods and drinks that are at room temperature. Ask your nurse or doctor about other helpful tips and medicine that is available to help stop or lessen these symptoms.   If you have numbness and tingling in your hands and feet, be careful when cooking, walking, and handling sharp objects and hot liquids.   Keeping your pain under control is important to your well-being. Please tell your doctor or nurse if you are experiencing pain.   Food and Drug Interactions   There are no known interactions of carboplatin with food.   This drug may interact with other medicines. Tell your doctor and pharmacist about all the prescription and over-the-counter medicines and dietary supplements (vitamins, minerals, herbs and others) that you are taking at this time. Also, check with your doctor or pharmacist before starting any new prescription or over-the-counter medicines, or dietary supplements to make  sure that there are no interactions.   When to Call the Doctor  Call your doctor or nurse if you have any of these symptoms and/or any new or unusual symptoms:   Fever of 100.4 F (38 C) or higher   Chills   Tiredness that interferes with your daily activities   Feeling dizzy  or lightheaded   Easy bleeding or bruising   Nausea that stops you from eating or drinking and/or is not relieved by prescribed medicines   Throwing up   Blurred vision or other changes in eyesight   Decrease in hearing or ringing in the ear   Signs of allergic reaction: swelling of the face, feeling like your tongue or throat are swelling, trouble breathing, rash, itching, fever, chills, feeling dizzy, and/or feeling that your heart is beating in a fast or not normal way. If this happens, call 911 for emergency care.   Signs of possible liver problems: dark urine, pale bowel movements, bad stomach pain, feeling very tired and weak, unusual itching, or yellowing of the eyes or skin   Decreased urine, or very dark urine   Numbness, tingling, or pain in your hands and feet   Pain that does not go away or is not relieved by prescribed medicine   While you are getting this drug, please tell your nurse right away if you have any pain, redness, or swelling at the site of the IV infusion, or if you have any new onset of symptoms, or if you just feel "different" from before when the infusion was started.   Reproduction Warnings   Pregnancy warning: This drug may have harmful effects on the unborn baby. Women of child bearing potential should use effective methods of birth control during your cancer treatment. Let your doctor know right away if you think you may be pregnant.   Breastfeeding warning: It is not known if this drug passes into breast milk. For this reason, women should not breastfeed during treatment because this drug could enter the breast milk and cause harm to a breastfeeding baby.   Fertility warning: Human fertility studies have not been done with this drug. Talk with your doctor or nurse if you plan to have children. Ask for information on sperm or egg banking.   SELF CARE ACTIVITIES WHILE ON CHEMOTHERAPY/IMMUNOTHERAPY:  Hydration Increase your fluid intake and drink  at least 64 ounces (2 liters) of water/decaffeinated beverages per day after treatment. You can still have your cup of coffee or soda but these beverages do not count as part of the 64 ounces that you need to drink daily. Limit alcohol intake.  Medications Continue taking your normal prescription medication as prescribed.  If you start any new herbal or new supplements please let us know first to make sure it is safe.  Mouth Care Have teeth cleaned professionally before starting treatment. Keep dentures and partial plates clean. Use soft toothbrush and do not use mouthwashes that contain alcohol. Biotene is a good mouthwash that is available at most pharmacies or may be ordered by calling [phone]. Use warm salt water gargles (1 teaspoon salt per 1 quart warm water) before and after meals and at bedtime. If you are still having problems with your mouth or sores in your mouth please call the clinic. If you need dental work, please let the doctor know before you go for your appointment so that we can coordinate the best possible time for you in regards to your chemo regimen. You need  to also let your dentist know that you are actively taking chemo. We may need to do labs prior to your dental appointment.  Skin Care Always use sunscreen that has not expired and with SPF (Sun Protection Factor) of 50 or higher. Wear hats to protect your head from the sun. Remember to use sunscreen on your hands, ears, face, & feet.  Use good moisturizing lotions such as udder cream, eucerin, or even Vaseline. Some chemotherapies can cause dry skin, color changes in your skin and nails.    Avoid long, hot showers or baths. Use gentle, fragrance-free soaps and laundry detergent. Use moisturizers, preferably creams or ointments rather than lotions because the thicker consistency is better at preventing skin dehydration. Apply the cream or ointment within 15 minutes of showering. Reapply moisturizer at night, and  moisturize your hands every time after you wash them.   Infection Prevention Please wash your hands for at least 30 seconds using warm soapy water. Handwashing is the #1 way to prevent the spread of germs. Stay away from sick people or people who are getting over a cold. If you develop respiratory systems such as green/yellow mucus production or productive cough or persistent cough let us know and we will see if you need an antibiotic. It is a good idea to keep a pair of gloves on when going into grocery stores/[employer] to decrease your risk of coming into contact with germs on the carts, etc. Carry alcohol hand gel with you at all times and use it frequently if out in public. If your temperature reaches 100.5 or higher please call the clinic and let us know.  If it is after hours or on the weekend please go to the ER if your temperature is over 100.4.  Please have your own personal thermometer at home to use.    Sex and bodily fluids If you are going to have sex, a condom must be used to protect the person that isn't taking immunotherapy. For a few days after treatment, immunotherapy can be excreted through your bodily fluids.  When using the toilet please close the lid and flush the toilet twice.  Do this for a few day after you have had immunotherapy.   Contraception It is not known for sure whether or not immunotherapy drugs can be passed on through semen or secretions from the vagina. Because of this some doctors advise people to use a barrier method if you have sex during treatment. This applies to vaginal, anal or oral sex.  Generally, doctors advise a barrier method only for the time you are actually having the treatment and for about a week after your treatment.  Advice like this can be worrying, but this does not mean that you have to avoid being intimate with your partner. You can still have close contact with your partner and continue to enjoy sex.  Animals If you have cats or birds we  ask that you not change the litter or change the cage.  Please have someone else do this for you while you are on immunotherapy.   Food Safety During and After Cancer Treatment Food safety is important for people both during and after cancer treatment. Cancer and cancer treatments, such as chemotherapy, radiation therapy, and stem cell/bone marrow transplantation, often weaken the immune system. This makes it harder for your body to protect itself from foodborne illness, also called food poisoning. Foodborne illness is caused by eating food that contains harmful bacteria, parasites, or viruses.  Foods  to avoid Some foods have a higher risk of becoming tainted with bacteria. These include: Unwashed fresh fruit and vegetables, especially leafy vegetables that can hide dirt and other contaminants Raw sprouts, such as alfalfa sprouts Raw or undercooked beef, especially ground beef, or other raw or undercooked meat and poultry Fatty, fried, or spicy foods immediately before or after treatment.  These can sit heavy on your stomach and make you feel nauseous. Raw or undercooked shellfish, such as oysters. Sushi and sashimi, which often contain raw fish.  Unpasteurized beverages, such as unpasteurized fruit juices, raw milk, raw yogurt, or cider Undercooked eggs, such as soft boiled, over easy, and poached; raw, unpasteurized eggs; or foods made with raw egg, such as homemade raw cookie dough and homemade mayonnaise  Simple steps for food safety  Shop smart. Do not buy food stored or displayed in an unclean area. Do not buy bruised or damaged fruits or vegetables. Do not buy cans that have cracks, dents, or bulges. Pick up foods that can spoil at the end of your shopping trip and store them in a cooler on the way home.  Prepare and clean up foods carefully. Rinse all fresh fruits and vegetables under running water, and dry them with a clean towel or paper towel. Clean the top of cans before opening  them. After preparing food, wash your hands for 20 seconds with hot water and soap. Pay special attention to areas between fingers and under nails. Clean your utensils and dishes with hot water and soap. Disinfect your kitchen and cutting boards using 1 teaspoon of liquid, unscented bleach mixed into 1 quart of water.    Dispose of old food. Eat canned and packaged food before its expiration date (the "use by" or "best before" date). Consume refrigerated leftovers within 3 to 4 days. After that time, throw out the food. Even if the food does not smell or look spoiled, it still may be unsafe. Some bacteria, such as Listeria, can grow even on foods stored in the refrigerator if they are kept for too long.  Take precautions when eating out. At restaurants, avoid buffets and salad bars where food sits out for a long time and comes in contact with many people. Food can become contaminated when someone with a virus, often a norovirus, or another "bug" handles it. Put any leftover food in a "to-go" container yourself, rather than having the server do it. And, refrigerate leftovers as soon as you get home. Choose restaurants that are clean and that are willing to prepare your food as you order it cooked.    SYMPTOMS TO REPORT AS SOON AS POSSIBLE AFTER TREATMENT:  FEVER GREATER THAN 100.4 F CHILLS WITH OR WITHOUT FEVER NAUSEA AND VOMITING THAT IS NOT CONTROLLED WITH YOUR NAUSEA MEDICATION UNUSUAL SHORTNESS OF BREATH UNUSUAL BRUISING OR BLEEDING TENDERNESS IN MOUTH AND THROAT WITH OR WITHOUT PRESENCE OF ULCERS URINARY PROBLEMS BOWEL PROBLEMS UNUSUAL RASH     Wear comfortable clothing and clothing appropriate for easy access to any Portacath or PICC line. Let us know if there is anything that we can do to make your therapy better!   What to do if you need assistance after hours or on the weekends: CALL [phone].  HOLD on the line, do not hang up.  You will hear multiple messages but at the  end you will be connected with a nurse triage line.  They will contact the doctor if necessary.  Most of the time they will be able  to assist you.  Do not call the hospital operator.    I have been informed and understand all of the instructions given to me and have received a copy. I have been instructed to call the clinic [phone] or my family physician as soon as possible for continued medical care, if indicated. I do not have any more questions at this time but understand that I may call the Cancer Center or the Patient Navigator at [phone] during office hours should I have questions or need assistance in obtaining follow-up care.

## 2023-02-26 NOTE — Assessment & Plan Note (Signed)
-  Patient reports pain while swallowing -Advised to continue take Advil as needed

## 2023-02-26 NOTE — Progress Notes (Addendum)
ALERT: Recent [facility] Treatment decision is outdated. Please await next [facility] decision

## 2023-02-27 ENCOUNTER — Encounter: Payer: Self-pay | Admitting: Oncology

## 2023-02-27 NOTE — Progress Notes (Signed)
[facility] Clinical Social Work  Initial Assessment   Gregory Fisher is a 72 y.o. year old male presenting alone. Clinical Social Work was referred by medical provider for assessment of psychosocial needs.   SDOH (Social Determinants of Health) assessments performed: Yes SDOH Interventions    Flowsheet Row Care Coordination from 02/11/2023 in [facility] Community Care Coordination Clinical Support from 01/27/2023 in [facility] Chronic Care Management from 07/25/2022 in [facility] at [facility] Chronic Care Management from 04/22/2022 in [facility] at [facility] Clinical Support from 07/05/2019 in [facility] Clinical Support from 04/30/2018 in [facility] [place]  SDOH Interventions        Food Insecurity Interventions Intervention Not Indicated Intervention Not Indicated Intervention Not Indicated  [Reports using local food bank. Will update team if additional resources are needed] -- -- --  Housing Interventions Intervention Not Indicated Intervention Not Indicated Intervention Not Indicated -- -- --  Transportation Interventions Intervention Not Indicated Intervention Not Indicated Intervention Not Indicated Intervention Not Indicated -- --  Utilities Interventions -- Intervention Not Indicated Intervention Not Indicated -- -- --  Depression Interventions/Treatment  -- RUE4-5 Score <4 Follow-up Not Indicated -- -- PHQ2-9 Score <4 Follow-up Not Indicated --  [referral to PCP]  Financial Strain Interventions -- Intervention Not Indicated Intervention Not Indicated Other (Comment)  [Farxiga PAP - enrolled] -- --  Physical Activity Interventions -- Intervention Not Indicated Other (Comments)  [Physical Activity limited d/t leg wounds/wraps] -- -- --  Stress Interventions -- Intervention Not Indicated Other (Comment)  [Reports some stress d/t health  condition. Declined current need for resources] -- -- --  Social Connections Interventions -- Intervention Not Indicated Intervention Not Indicated -- -- --  Health Literacy Interventions -- Intervention Not Indicated -- -- -- --       SDOH Screenings   Food Insecurity: No Food Insecurity (02/11/2023)  Housing: Low Risk  (02/11/2023)  Transportation Needs: No Transportation Needs (02/11/2023)  Utilities: Not At Risk (01/27/2023)  Alcohol Screen: Low Risk  (07/05/2019)  Depression (PHQ2-9): Low Risk  (02/11/2023)  Financial Resource Strain: Low Risk  (01/27/2023)  Physical Activity: Inactive (01/27/2023)  Social Connections: Socially Isolated (01/27/2023)  Stress: No Stress Concern Present (01/27/2023)  Tobacco Use: Low Risk  (02/26/2023)  Health Literacy: Adequate Health Literacy (01/27/2023)     Distress Screen completed: No     No data to display            Family/Social Information:  Housing Arrangement: patient lives with a friend he has known for over 50 years.  Pt has been living with his friend Gregory Fisher for approximately 4 years.  They have 11 dogs and 1 puppy.  Pt's friend breeds Nature conservation officer. Family members/support persons in your life? Pt has 2 brothers and 2 sisters living, 2 brothers have passed away.  Pt states he has been communicating w/ his family about his diagnosis, but none are local and they are not able to offer support at this time. Transportation concerns: Pt states his friend Gregory Fisher will be driving him to appointments, should transportation concerns arise they will be addressed at that time.  Employment: Retired Curator for [employer].  Income source: Actor concerns: Yes, current concerns Type of concern: Utilities and Medical bills Food access concerns: yes Religious or spiritual practice: Yes-[employer] [facility] Currently in place:  none  Coping/ Adjustment to diagnosis:  Patient understands treatment plan and what happens  next? yes Concerns about diagnosis and/or treatment: Overwhelmed by information, Afraid of cancer, How I will pay for the services I need, and How will I care for myself Patient reported stressors: Finances, Food, Depression, Anxiety/ nervousness, Adjusting to my illness, and Isolation/ feeling alone Hopes and/or priorities: Pt has had difficulty understanding his diagnosis and committing to move forward with treatment, but now states that he still does not completely understand everything and is very overwhelmed by keeping track of all of the appointments but wants to move forward w/ treatment and is hopeful it will extend his life. Patient enjoys  not addressed Current coping skills/ strengths: Capable of independent living  and Motivation for treatment/growth     SUMMARY: Current SDOH Barriers:  Financial constraints related to fixed income, Limited social support, Limited access to food, and Social Isolation  Clinical Social Work Clinical Goal(s):  Explore community resource options for unmet needs related to:  Corporate treasurer , Food Insecurity , Depression  , and Physical Activity  Interventions: Discussed common feeling and emotions when being diagnosed with cancer, and the importance of support during treatment Informed patient of the support team roles and support services at [facility] Provided CSW contact information and encouraged patient to call with any questions or concerns Referred patient to Marijean Niemann for additional support, provided information on [employer] and signed acknowledgement form, provided w/ a food bag.   Follow Up Plan: CSW will follow-up with patient by phone  Patient verbalizes understanding of plan: Yes    Rachel Moulds, LCSW Clinical Social Worker [facility]

## 2023-02-28 ENCOUNTER — Other Ambulatory Visit: Payer: Self-pay

## 2023-03-02 ENCOUNTER — Other Ambulatory Visit: Payer: Self-pay

## 2023-03-02 ENCOUNTER — Other Ambulatory Visit (HOSPITAL_COMMUNITY): Payer: Self-pay | Admitting: Occupational Therapy

## 2023-03-02 DIAGNOSIS — C069 Malignant neoplasm of mouth, unspecified: Secondary | ICD-10-CM

## 2023-03-02 DIAGNOSIS — C109 Malignant neoplasm of oropharynx, unspecified: Secondary | ICD-10-CM

## 2023-03-02 DIAGNOSIS — C108 Malignant neoplasm of overlapping sites of oropharynx: Secondary | ICD-10-CM | POA: Diagnosis not present

## 2023-03-02 DIAGNOSIS — R1310 Dysphagia, unspecified: Secondary | ICD-10-CM

## 2023-03-02 DIAGNOSIS — R1312 Dysphagia, oropharyngeal phase: Secondary | ICD-10-CM

## 2023-03-05 ENCOUNTER — Encounter (HOSPITAL_COMMUNITY)
Admission: RE | Admit: 2023-03-05 | Discharge: 2023-03-05 | Disposition: A | Discharge status: Home / Self Care | Payer: Medicare HMO | Source: Ambulatory Visit | Attending: Physician Assistant | Admitting: Physician Assistant

## 2023-03-05 ENCOUNTER — Ambulatory Visit (INDEPENDENT_AMBULATORY_CARE_PROVIDER_SITE_OTHER): Payer: Medicare HMO | Admitting: Otolaryngology

## 2023-03-05 ENCOUNTER — Encounter (INDEPENDENT_AMBULATORY_CARE_PROVIDER_SITE_OTHER): Payer: Self-pay | Admitting: Otolaryngology

## 2023-03-05 ENCOUNTER — Telehealth (HOSPITAL_COMMUNITY): Payer: Self-pay

## 2023-03-05 VITALS — BP 160/96 | HR 97 | Ht 60.5 in | Wt 256.0 lb

## 2023-03-05 DIAGNOSIS — R040 Epistaxis: Secondary | ICD-10-CM

## 2023-03-05 DIAGNOSIS — J392 Other diseases of pharynx: Secondary | ICD-10-CM

## 2023-03-05 DIAGNOSIS — R0981 Nasal congestion: Secondary | ICD-10-CM

## 2023-03-05 DIAGNOSIS — R4781 Slurred speech: Secondary | ICD-10-CM

## 2023-03-05 DIAGNOSIS — C109 Malignant neoplasm of oropharynx, unspecified: Secondary | ICD-10-CM

## 2023-03-05 MED ORDER — SALINE SPRAY 0.65 % NA SOLN: 1.0000 | NASAL | 5 refills | Status: DC | PRN

## 2023-03-05 NOTE — Telephone Encounter (Signed)
-----   Message from Simonne Come sent at 03/02/2023  4:57 PM EDT ----- Regarding: RE: Peg/port placement If able, recommend deferring this until PET CT is completed.  Nedra Hai ----- Message ----- From: Sharee Pimple Sent: 02/26/2023  10:52 AM EDT To: Ir Procedure Requests Subject: Peg/port placement                             Procedure: Peg/port placement  Dx: head and neck cancer, radiation therapy administration  Ordering: Dr. Kristine Royal [phone]  Imaging: Pt has a ct abd/pelvis in Intelerad that was done at [facility] in October 2023. He has a NM Pet scheduled 9/26 if the ct is not ok. I will resend if needed.   Please review.   Thanks,  Fara Boros
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
## 2023-03-05 NOTE — Patient Instructions (Addendum)
-   attend all scheduled appointments  - get PET/CT done - we will see you at the end of your treatment in 6-8 weeks  - schedule your appointment to see Neurology (one you cancelled) in the next few weeks  - vaseline and saline spray for the nose to prevent nose bleeds

## 2023-03-06 DIAGNOSIS — J359 Chronic disease of tonsils and adenoids, unspecified: Secondary | ICD-10-CM | POA: Diagnosis not present

## 2023-03-06 DIAGNOSIS — C108 Malignant neoplasm of overlapping sites of oropharynx: Secondary | ICD-10-CM | POA: Diagnosis not present

## 2023-03-06 DIAGNOSIS — C770 Secondary and unspecified malignant neoplasm of lymph nodes of head, face and neck: Secondary | ICD-10-CM | POA: Diagnosis not present

## 2023-03-09 ENCOUNTER — Ambulatory Visit (HOSPITAL_COMMUNITY): Payer: Medicare HMO

## 2023-03-09 ENCOUNTER — Other Ambulatory Visit: Payer: Self-pay

## 2023-03-09 DIAGNOSIS — C109 Malignant neoplasm of oropharynx, unspecified: Secondary | ICD-10-CM

## 2023-03-10 ENCOUNTER — Inpatient Hospital Stay: Payer: Medicare HMO | Attending: Oncology

## 2023-03-10 DIAGNOSIS — C109 Malignant neoplasm of oropharynx, unspecified: Secondary | ICD-10-CM | POA: Insufficient documentation

## 2023-03-10 DIAGNOSIS — F32A Depression, unspecified: Secondary | ICD-10-CM | POA: Insufficient documentation

## 2023-03-10 DIAGNOSIS — J359 Chronic disease of tonsils and adenoids, unspecified: Secondary | ICD-10-CM | POA: Diagnosis not present

## 2023-03-10 DIAGNOSIS — Z5111 Encounter for antineoplastic chemotherapy: Secondary | ICD-10-CM | POA: Insufficient documentation

## 2023-03-10 DIAGNOSIS — C770 Secondary and unspecified malignant neoplasm of lymph nodes of head, face and neck: Secondary | ICD-10-CM | POA: Diagnosis not present

## 2023-03-10 DIAGNOSIS — G893 Neoplasm related pain (acute) (chronic): Secondary | ICD-10-CM | POA: Insufficient documentation

## 2023-03-10 DIAGNOSIS — C108 Malignant neoplasm of overlapping sites of oropharynx: Secondary | ICD-10-CM | POA: Diagnosis not present

## 2023-03-10 MED ORDER — DEXAMETHASONE 4 MG PO TABS
ORAL_TABLET | ORAL | 1 refills | Status: DC
Start: 2023-03-11 — End: 2023-05-27

## 2023-03-10 MED ORDER — ONDANSETRON HCL 8 MG PO TABS: 8.0000 mg | ORAL_TABLET | Freq: Three times a day (TID) | ORAL | 1 refills | Status: DC | PRN

## 2023-03-10 MED ORDER — LIDOCAINE-PRILOCAINE 2.5-2.5 % EX CREA: TOPICAL_CREAM | CUTANEOUS | 3 refills | Status: DC

## 2023-03-10 NOTE — Progress Notes (Signed)

## 2023-03-10 NOTE — Progress Notes (Signed)
I met with the patient today following chemotherapy education. Patient is quite overwhelmed with upcoming appointments and especially how to follow instructions for those appointments. I provided the patient with written instructions for his Thursday, 10/3 appointments, including the NPO status. Patient verbalized understanding and asked for a reminder call tomorrow regarding appointment.

## 2023-03-11 ENCOUNTER — Ambulatory Visit: Payer: Self-pay | Admitting: *Deleted

## 2023-03-11 ENCOUNTER — Ambulatory Visit: Payer: Medicare HMO | Admitting: Neurology

## 2023-03-11 ENCOUNTER — Encounter: Payer: Self-pay | Admitting: Neurology

## 2023-03-11 DIAGNOSIS — C108 Malignant neoplasm of overlapping sites of oropharynx: Secondary | ICD-10-CM | POA: Diagnosis not present

## 2023-03-11 DIAGNOSIS — Z029 Encounter for administrative examinations, unspecified: Secondary | ICD-10-CM

## 2023-03-11 NOTE — Progress Notes (Signed)
Patient aware of tomorrow's appointments and instructions for nothing to eat or drink after 7AM except for plain water.

## 2023-03-11 NOTE — Patient Instructions (Signed)
Visit Information  Thank you for taking time to visit with me today. Please don't hesitate to contact me if I can be of assistance to you.   Following are the goals we discussed today:  Patient to follow up with the Cancer Center regarding anticipated treatment plan Patient to contact his provider with any questions regarding his medical care Patient to contact this social worker with any additional questions or concerns related to his community resource needs   Our next appointment is by telephone on 04/15/23 at 2pm  Please call the care guide team at [phone] if you need to cancel or reschedule your appointment.   If you are experiencing a Mental Health or Behavioral Health Crisis or need someone to talk to, please call 911   Patient verbalizes understanding of instructions and care plan provided today and agrees to view in MyChart. Active MyChart status and patient understanding of how to access instructions and care plan via MyChart confirmed with patient.     Telephone follow up appointment with care management team member scheduled for: 04/15/23   Verna Czech, LCSW [place]  [facility], [facility] Licensed Clinical Social Worker Care Coordinator  Direct Dial: [phone]

## 2023-03-11 NOTE — Patient Outreach (Signed)
Care Coordination   Follow Up Visit Note   03/11/2023 Name: QUINTUS GREEAR MRN: [id number] DOB: [birth year]  Marnee Spring Kollars is a 72 y.o. year old male who sees Eustaquio Boyden, MD for primary care. I spoke with  Myles Lipps by phone today.  What matters to the patients health and wellness today?  Management of depression and anxiety related to his current health condition.    Goals Addressed             This Visit's Progress    Care coordination activities       Interventions Today    Flowsheet Row Most Recent Value  Chronic Disease   Chronic disease during today's visit Other  [pharyngeal mass]  General Interventions   General Interventions Discussed/Reviewed --  [Patient continues to receive follow up through the Cancer Center]  Doctor Visits Discussed/Reviewed Doctor Visits Reviewed  [10/2-Pet Scan]  Mental Health Interventions   Mental Health Discussed/Reviewed Mental Health Reviewed, Coping Strategies, Depression  [confirmed improvement in mood and motiviation to follow through with tx, appreciative of show of positive supports from family, friends and hospital emotional support and pos reinforcement provided for utilizng pos coping strategies to manage health cond]  Nutrition Interventions   Nutrition Discussed/Reviewed Nutrition Discussed, Supplemental nutrition  [patient discussed loss of appetite and weight. Encouraged patient to discuss concerns with medical provider.Per patient, food resources provided by the cancer center social worker]              SDOH assessments and interventions completed:  Yes  SDOH Interventions Today    Flowsheet Row Most Recent Value  SDOH Interventions   Food Insecurity Interventions Other (Comment)  [patient provided with food through [employer] utlizing area food banks as well]  Housing Interventions Intervention Not Indicated  Transportation Interventions Intervention Not Indicated  Utilities  Interventions Intervention Not Indicated        Care Coordination Interventions:  Yes, provided   Follow up plan: Follow up call scheduled for 04/15/23    Encounter Outcome:  Patient Visit Completed

## 2023-03-12 ENCOUNTER — Ambulatory Visit (HOSPITAL_COMMUNITY)
Admission: RE | Admit: 2023-03-12 | Discharge: 2023-03-12 | Disposition: A | Discharge status: Home / Self Care | Payer: Medicare HMO | Source: Ambulatory Visit | Attending: Physician Assistant | Admitting: Physician Assistant

## 2023-03-12 ENCOUNTER — Inpatient Hospital Stay: Payer: Medicare HMO | Admitting: Licensed Clinical Social Worker

## 2023-03-12 ENCOUNTER — Inpatient Hospital Stay: Payer: Medicare HMO | Admitting: Dietician

## 2023-03-12 DIAGNOSIS — C109 Malignant neoplasm of oropharynx, unspecified: Secondary | ICD-10-CM

## 2023-03-12 DIAGNOSIS — C770 Secondary and unspecified malignant neoplasm of lymph nodes of head, face and neck: Secondary | ICD-10-CM | POA: Diagnosis not present

## 2023-03-12 DIAGNOSIS — I700 Atherosclerosis of aorta: Secondary | ICD-10-CM | POA: Diagnosis not present

## 2023-03-12 DIAGNOSIS — K429 Umbilical hernia without obstruction or gangrene: Secondary | ICD-10-CM | POA: Insufficient documentation

## 2023-03-12 DIAGNOSIS — I2510 Atherosclerotic heart disease of native coronary artery without angina pectoris: Secondary | ICD-10-CM | POA: Diagnosis not present

## 2023-03-12 DIAGNOSIS — C760 Malignant neoplasm of head, face and neck: Secondary | ICD-10-CM | POA: Diagnosis not present

## 2023-03-12 MED ORDER — FLUDEOXYGLUCOSE F - 18 (FDG) INJECTION
13.3700 | Freq: Once | INTRAVENOUS | Status: AC | PRN
  Administered 2023-03-12: 13.37 via INTRAVENOUS

## 2023-03-12 NOTE — Progress Notes (Signed)
Nutrition Follow-up:  Patient with oropharyngeal cancer. Planning for concurrent chemoradiation with weekly carboplatin + paclitaxel. Pending PET r/s (10/3) as pt had not remained NPO for 9/26 scan.   PEG r/s - 10/9  Met with pt and friend in clinic for PEG care education. Social worker Ronda Fairly) present today for visit. Patient is overwhelmed with diagnosis, treatment planning, and appointments. He endorses cognitive decline in the last few months. States he thinks he might have dementia. His mother had this. Patient is food insecure, relies on food banks for a hot lunch plate and food assistance. Patient reports decreased appetite. He is asking if he will be able to eat orally after tube is placed.    Medications: zofran, decadron (10/2)  Labs: no new labs  Anthropometrics: Pt 281 lb 1.4 oz on 8/21  Noted 256 lb on 9/26 (suspect this is not accurate, ht listed as 5'05" which is incorrect as well)  Estimated Energy Needs  Kcals: 2500-2800 Protein: 140-155 Fluid: >/= 2.5 L  NUTRITION DIAGNOSIS: Inadequate oral intake continues    INTERVENTION:  Explained rationale for feeding tube placement, answered all questions - pt understanding  Utilized PEG teaching device to provide education on daily care and water flush - pt successfully demonstrated how to flush tube x3 and able to teach RD how to clean around tube - 2 Enfit syringes, box of split guaze, mesh briefs to hold tube in place provided to pt Nurse navigator Kennith Gain) to change bandage and flush tube 10/10 Escorted pt and friend downstairs to waiting area for PET Support and encouragement  Bolus feeding education to be provided  MONITORING, EVALUATION, GOAL: wt trends, intake   NEXT VISIT: Monday October 14

## 2023-03-12 NOTE — Progress Notes (Signed)
[facility] CSW Progress Note  Visual merchandiser met with patient and his friend [name] to check in and reiterate financial resources available.  Pt's friend [name] verbalized agreement to assist pt with obtaining his bank statement to turn in for the [employer] application.  Pt has connected w/ Marijean Niemann and will be receiving additional support from them.  CSW to remain available as appropriate throughout duration of treatment to provide support.      Rachel Moulds, LCSW Clinical Social Worker [facility]

## 2023-03-13 ENCOUNTER — Other Ambulatory Visit: Payer: Self-pay

## 2023-03-13 ENCOUNTER — Encounter (HOSPITAL_COMMUNITY): Payer: Self-pay

## 2023-03-13 DIAGNOSIS — C109 Malignant neoplasm of oropharynx, unspecified: Secondary | ICD-10-CM

## 2023-03-16 ENCOUNTER — Telehealth (HOSPITAL_COMMUNITY): Payer: Self-pay

## 2023-03-16 NOTE — Telephone Encounter (Signed)
-----   Message from Roanna Banning sent at 03/16/2023  1:20 PM EDT ----- Regarding: RE: Peg/Port Placement PROCEDURE / BIOPSY REVIEW Date: 03/16/23  Requested procedure(s): Port + G-tube Reason for request: H&N CA Imaging review: I reviewed all pertinent diagnostic studies, including; PET CT, 03/12/23  Decision: Approved Imaging modality to perform: Fluoroscopy Schedule with: Moderate Sedation Schedule for: Any VIR  Additional comments:  @Schedulers . OK for same day PAC and G-tube placement.   Please contact me with questions, concerns, or if issue pertaining to this request arise.  Roanna Banning, MD [facility] ----- Message ----- From: Sharee Pimple Sent: 03/16/2023  10:38 AM EDT To: Ir Procedure Requests Subject: Peg/Port Placement                             Procedure: Peg/port placement   Dx: head and neck cancer, radiation therapy administration   Ordering: Dr. Kristine Royal [phone]   Imaging:  PET done 03/12/23 in epic.   Please review.   Thanks,  Fara Boros

## 2023-03-17 ENCOUNTER — Other Ambulatory Visit (HOSPITAL_COMMUNITY): Payer: Self-pay | Admitting: Student

## 2023-03-17 DIAGNOSIS — C109 Malignant neoplasm of oropharynx, unspecified: Secondary | ICD-10-CM

## 2023-03-17 NOTE — Progress Notes (Signed)
Called patient to remind patient of appointment for G-tube and Port-A-Cath placement for tomorrow. Reviewed instructions for contrast. Patient was able to repeat instructions back to me and verbalize understanding.
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
## 2023-03-18 ENCOUNTER — Other Ambulatory Visit: Payer: Self-pay

## 2023-03-18 ENCOUNTER — Ambulatory Visit (HOSPITAL_COMMUNITY)
Admission: RE | Admit: 2023-03-18 | Discharge: 2023-03-18 | Disposition: A | Discharge status: Home / Self Care | Payer: Medicare HMO | Source: Ambulatory Visit | Attending: Oncology | Admitting: Oncology

## 2023-03-18 DIAGNOSIS — C760 Malignant neoplasm of head, face and neck: Secondary | ICD-10-CM | POA: Diagnosis not present

## 2023-03-18 DIAGNOSIS — C109 Malignant neoplasm of oropharynx, unspecified: Secondary | ICD-10-CM | POA: Insufficient documentation

## 2023-03-18 DIAGNOSIS — R1310 Dysphagia, unspecified: Secondary | ICD-10-CM | POA: Insufficient documentation

## 2023-03-18 DIAGNOSIS — E785 Hyperlipidemia, unspecified: Secondary | ICD-10-CM | POA: Insufficient documentation

## 2023-03-18 DIAGNOSIS — I10 Essential (primary) hypertension: Secondary | ICD-10-CM | POA: Insufficient documentation

## 2023-03-18 HISTORY — PX: IR IMAGING GUIDED PORT INSERTION: IMG5740

## 2023-03-18 HISTORY — PX: IR GASTROSTOMY TUBE MOD SED: IMG625

## 2023-03-18 LAB — CBC
HCT: 43.1 % (ref 39.0–52.0)
Hemoglobin: 14.7 g/dL (ref 13.0–17.0)
MCH: 29.5 pg (ref 26.0–34.0)
MCHC: 34.1 g/dL (ref 30.0–36.0)
MCV: 86.4 fL (ref 80.0–100.0)
Platelets: 357 10*3/uL (ref 150–400)
RBC: 4.99 MIL/uL (ref 4.22–5.81)
RDW: 13.8 % (ref 11.5–15.5)
WBC: 10 10*3/uL (ref 4.0–10.5)
nRBC: 0 % (ref 0.0–0.2)

## 2023-03-18 LAB — GLUCOSE, CAPILLARY: Glucose-Capillary: 158 mg/dL — ABNORMAL HIGH (ref 70–99)

## 2023-03-18 LAB — PROTIME-INR
INR: 1 (ref 0.8–1.2)
Prothrombin Time: 13.3 s (ref 11.4–15.2)

## 2023-03-18 MED ORDER — MIDAZOLAM HCL 2 MG/2ML IJ SOLN
INTRAMUSCULAR | Status: AC
  Filled 2023-03-18: qty 2

## 2023-03-18 MED ORDER — GLUCAGON HCL RDNA (DIAGNOSTIC) 1 MG IJ SOLR
INTRAMUSCULAR | Status: AC
  Filled 2023-03-18: qty 1

## 2023-03-18 MED ORDER — SODIUM CHLORIDE 0.9 % IV SOLN: INTRAVENOUS | Status: DC

## 2023-03-18 MED ORDER — FENTANYL CITRATE (PF) 100 MCG/2ML IJ SOLN
INTRAMUSCULAR | Status: AC | PRN
Start: 2023-03-18 — End: 2023-03-18
  Administered 2023-03-18 (×3): 25 ug via INTRAVENOUS

## 2023-03-18 MED ORDER — FENTANYL CITRATE (PF) 100 MCG/2ML IJ SOLN
INTRAMUSCULAR | Status: AC
  Filled 2023-03-18: qty 2

## 2023-03-18 MED ORDER — GLUCAGON HCL (RDNA) 1 MG IJ SOLR
INTRAMUSCULAR | Status: AC | PRN
Start: 2023-03-18 — End: 2023-03-18
  Administered 2023-03-18: 1 mg via INTRAVENOUS

## 2023-03-18 MED ORDER — LIDOCAINE HCL 1 % IJ SOLN
INTRAMUSCULAR | Status: AC
  Filled 2023-03-18: qty 20

## 2023-03-18 MED ORDER — MIDAZOLAM HCL 2 MG/2ML IJ SOLN
INTRAMUSCULAR | Status: AC | PRN
Start: 2023-03-18 — End: 2023-03-18
  Administered 2023-03-18 (×3): .5 mg via INTRAVENOUS

## 2023-03-18 MED ORDER — CEFAZOLIN SODIUM-DEXTROSE 2-4 GM/100ML-% IV SOLN
2.0000 g | INTRAVENOUS | Status: AC
  Administered 2023-03-18: 2 g via INTRAVENOUS

## 2023-03-18 MED ORDER — HEPARIN SOD (PORK) LOCK FLUSH 100 UNIT/ML IV SOLN
INTRAVENOUS | Status: AC
  Filled 2023-03-18: qty 5

## 2023-03-18 MED ORDER — LIDOCAINE-EPINEPHRINE 1 %-1:100000 IJ SOLN
INTRAMUSCULAR | Status: AC
  Filled 2023-03-18: qty 1

## 2023-03-18 MED ORDER — SODIUM CHLORIDE 0.9 % IV SOLN
INTRAVENOUS | Status: AC | PRN
Start: 2023-03-18 — End: 2023-03-18
  Administered 2023-03-18 (×2): 250 mL via INTRAVENOUS

## 2023-03-18 MED ORDER — CEFAZOLIN SODIUM-DEXTROSE 2-4 GM/100ML-% IV SOLN
INTRAVENOUS | Status: AC
  Filled 2023-03-18: qty 100

## 2023-03-18 NOTE — Procedures (Signed)
Pre Procedure Dx: Head and Neck cancer; Dysphagia; Poor venous access. Post Procedural Dx: Same  Successful placement of right IJ approach port-a-cath with tip at the superior caval atrial junction. The catheter is ready for immediate use.  Successful fluoroscopic guided insertion of gastrostomy tube.   The gastrostomy tube may be used immediately for medications.   Tube feeds may be initiated in 4 hours.    EBL: Trace Complications: None immediate  Katherina Right, MD Pager #: [phone]

## 2023-03-19 DIAGNOSIS — J359 Chronic disease of tonsils and adenoids, unspecified: Secondary | ICD-10-CM | POA: Diagnosis not present

## 2023-03-19 DIAGNOSIS — C108 Malignant neoplasm of overlapping sites of oropharynx: Secondary | ICD-10-CM | POA: Diagnosis not present

## 2023-03-19 DIAGNOSIS — C770 Secondary and unspecified malignant neoplasm of lymph nodes of head, face and neck: Secondary | ICD-10-CM | POA: Diagnosis not present

## 2023-03-20 ENCOUNTER — Encounter: Payer: Self-pay | Admitting: Oncology

## 2023-03-20 DIAGNOSIS — C770 Secondary and unspecified malignant neoplasm of lymph nodes of head, face and neck: Secondary | ICD-10-CM | POA: Diagnosis not present

## 2023-03-20 DIAGNOSIS — J359 Chronic disease of tonsils and adenoids, unspecified: Secondary | ICD-10-CM | POA: Diagnosis not present

## 2023-03-20 DIAGNOSIS — C108 Malignant neoplasm of overlapping sites of oropharynx: Secondary | ICD-10-CM | POA: Diagnosis not present

## 2023-03-23 ENCOUNTER — Inpatient Hospital Stay: Payer: Medicare HMO | Admitting: Dietician

## 2023-03-23 ENCOUNTER — Inpatient Hospital Stay (HOSPITAL_BASED_OUTPATIENT_CLINIC_OR_DEPARTMENT_OTHER): Payer: Medicare HMO | Admitting: Oncology

## 2023-03-23 ENCOUNTER — Encounter: Payer: Self-pay | Admitting: Oncology

## 2023-03-23 ENCOUNTER — Inpatient Hospital Stay: Payer: Medicare HMO

## 2023-03-23 VITALS — Wt 276.0 lb

## 2023-03-23 VITALS — BP 114/70 | HR 75 | Temp 99.0°F | Resp 20

## 2023-03-23 DIAGNOSIS — F329 Major depressive disorder, single episode, unspecified: Secondary | ICD-10-CM

## 2023-03-23 DIAGNOSIS — C770 Secondary and unspecified malignant neoplasm of lymph nodes of head, face and neck: Secondary | ICD-10-CM | POA: Diagnosis not present

## 2023-03-23 DIAGNOSIS — C108 Malignant neoplasm of overlapping sites of oropharynx: Secondary | ICD-10-CM | POA: Diagnosis not present

## 2023-03-23 DIAGNOSIS — C109 Malignant neoplasm of oropharynx, unspecified: Secondary | ICD-10-CM

## 2023-03-23 DIAGNOSIS — G893 Neoplasm related pain (acute) (chronic): Secondary | ICD-10-CM

## 2023-03-23 DIAGNOSIS — Z5111 Encounter for antineoplastic chemotherapy: Secondary | ICD-10-CM | POA: Diagnosis not present

## 2023-03-23 DIAGNOSIS — F32A Depression, unspecified: Secondary | ICD-10-CM | POA: Diagnosis not present

## 2023-03-23 DIAGNOSIS — J359 Chronic disease of tonsils and adenoids, unspecified: Secondary | ICD-10-CM | POA: Diagnosis not present

## 2023-03-23 LAB — CBC WITH DIFFERENTIAL/PLATELET
Abs Immature Granulocytes: 0.03 10*3/uL (ref 0.00–0.07)
Basophils Absolute: 0.1 10*3/uL (ref 0.0–0.1)
Basophils Relative: 1 %
Eosinophils Absolute: 0.4 10*3/uL (ref 0.0–0.5)
Eosinophils Relative: 4 %
HCT: 40.6 % (ref 39.0–52.0)
Hemoglobin: 13.6 g/dL (ref 13.0–17.0)
Immature Granulocytes: 0 %
Lymphocytes Relative: 9 %
Lymphs Abs: 1 10*3/uL (ref 0.7–4.0)
MCH: 30.2 pg (ref 26.0–34.0)
MCHC: 33.5 g/dL (ref 30.0–36.0)
MCV: 90 fL (ref 80.0–100.0)
Monocytes Absolute: 0.9 10*3/uL (ref 0.1–1.0)
Monocytes Relative: 8 %
Neutro Abs: 8.4 10*3/uL — ABNORMAL HIGH (ref 1.7–7.7)
Neutrophils Relative %: 78 %
Platelets: 277 10*3/uL (ref 150–400)
RBC: 4.51 MIL/uL (ref 4.22–5.81)
RDW: 13.6 % (ref 11.5–15.5)
WBC: 10.8 10*3/uL — ABNORMAL HIGH (ref 4.0–10.5)
nRBC: 0 % (ref 0.0–0.2)

## 2023-03-23 LAB — COMPREHENSIVE METABOLIC PANEL WITH GFR
ALT: 15 U/L (ref 0–44)
AST: 15 U/L (ref 15–41)
Albumin: 3.5 g/dL (ref 3.5–5.0)
Alkaline Phosphatase: 90 U/L (ref 38–126)
Anion gap: 7 (ref 5–15)
BUN: 25 mg/dL — ABNORMAL HIGH (ref 8–23)
CO2: 26 mmol/L (ref 22–32)
Calcium: 8.8 mg/dL — ABNORMAL LOW (ref 8.9–10.3)
Chloride: 102 mmol/L (ref 98–111)
Creatinine, Ser: 0.88 mg/dL (ref 0.61–1.24)
GFR, Estimated: >60 mL/min
Glucose, Bld: 145 mg/dL — ABNORMAL HIGH (ref 70–99)
Potassium: 3.9 mmol/L (ref 3.5–5.1)
Sodium: 135 mmol/L (ref 135–145)
Total Bilirubin: 0.7 mg/dL (ref 0.3–1.2)
Total Protein: 7.3 g/dL (ref 6.5–8.1)

## 2023-03-23 LAB — MAGNESIUM: Magnesium: 2.3 mg/dL (ref 1.7–2.4)

## 2023-03-23 MED ORDER — SODIUM CHLORIDE 0.9 % IV SOLN: Freq: Once | INTRAVENOUS | Status: AC

## 2023-03-23 MED ORDER — SODIUM CHLORIDE 0.9 % IV SOLN
294.4000 mg | Freq: Once | INTRAVENOUS | Status: AC
  Administered 2023-03-23: 290 mg via INTRAVENOUS
  Filled 2023-03-23: qty 29

## 2023-03-23 MED ORDER — SODIUM CHLORIDE 0.9 % IV SOLN
10.0000 mg | Freq: Once | INTRAVENOUS | Status: AC
  Administered 2023-03-23: 10 mg via INTRAVENOUS
  Filled 2023-03-23: qty 10

## 2023-03-23 MED ORDER — SODIUM CHLORIDE 0.9 % IV SOLN
45.0000 mg/m2 | Freq: Once | INTRAVENOUS | Status: AC
  Administered 2023-03-23: 108 mg via INTRAVENOUS
  Filled 2023-03-23: qty 18

## 2023-03-23 MED ORDER — HEPARIN SOD (PORK) LOCK FLUSH 100 UNIT/ML IV SOLN
500.0000 [IU] | Freq: Once | INTRAVENOUS | Status: AC | PRN
  Administered 2023-03-23: 500 [IU]

## 2023-03-23 MED ORDER — PALONOSETRON HCL INJECTION 0.25 MG/5ML
0.2500 mg | Freq: Once | INTRAVENOUS | Status: AC
  Administered 2023-03-23: 0.25 mg via INTRAVENOUS
  Filled 2023-03-23: qty 5

## 2023-03-23 MED ORDER — SODIUM CHLORIDE 0.9% FLUSH
10.0000 mL | Freq: Once | INTRAVENOUS | Status: AC
  Administered 2023-03-23: 10 mL via INTRAVENOUS

## 2023-03-23 MED ORDER — DIPHENHYDRAMINE HCL 50 MG/ML IJ SOLN
50.0000 mg | Freq: Once | INTRAMUSCULAR | Status: AC
  Administered 2023-03-23: 50 mg via INTRAVENOUS
  Filled 2023-03-23: qty 1

## 2023-03-23 MED ORDER — FAMOTIDINE IN NACL 20-0.9 MG/50ML-% IV SOLN
20.0000 mg | Freq: Once | INTRAVENOUS | Status: AC
  Administered 2023-03-23: 20 mg via INTRAVENOUS
  Filled 2023-03-23: qty 50

## 2023-03-23 MED ORDER — SODIUM CHLORIDE 0.9% FLUSH
10.0000 mL | INTRAVENOUS | Status: DC | PRN
  Administered 2023-03-23: 10 mL

## 2023-03-23 NOTE — Progress Notes (Signed)
Patient presents today for C1D1 Taxol, Carboplatin weekly with XRT. Vital signs and labs within parameters for treatment.   Message received from Dr. Anders Simmonds to proceed with treatment.   Treatment given today per MD orders. Tolerated infusion without adverse affects. Vital signs stable. No complaints at this time. Appointment schedule printed and highlighted with upcoming appointments. Patient instructed to go straight to [facility] and they are expecting him. Patient teaching performed pertaining to G-Tube flushing daily. Friend at the bedside verbalized understanding. Discharged from clinic by wheel chair in stable condition. Alert and oriented x 3. F/U with [facility] as scheduled.

## 2023-03-23 NOTE — Assessment & Plan Note (Signed)
-  Patient stated symptoms of depression on last visit -Does not have active suicidal intentions but feels depressed and does not want to do a lot -Patient feeling better on this visit but is overwhelmed with everything going on

## 2023-03-23 NOTE — Assessment & Plan Note (Signed)
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 Baseline labs stable. Good for treatment today.   RTC in 2 weeks

## 2023-03-23 NOTE — Patient Instructions (Signed)
[facility]  Discharge Instructions: Thank you for choosing [facility] to provide your oncology and hematology care.  If you have a lab appointment with the Cancer Center - please note that after April 8th, 2024, all labs will be drawn in the cancer center.  You do not have to check in or register with the main entrance as you have in the past but will complete your check-in in the cancer center.  Wear comfortable clothing and clothing appropriate for easy access to any Portacath or PICC line.   We strive to give you quality time with your provider. You may need to reschedule your appointment if you arrive late (15 or more minutes).  Arriving late affects you and other patients whose appointments are after yours.  Also, if you miss three or more appointments without notifying the office, you may be dismissed from the clinic at the provider's discretion.      For prescription refill requests, have your pharmacy contact our office and allow 72 hours for refills to be completed.    Today you received the following chemotherapy and/or immunotherapy agents Carboplatin and Taxol. Carboplatin Injection What is this medication? CARBOPLATIN (KAR boe pla tin) treats some types of cancer. It works by slowing down the growth of cancer cells. This medicine may be used for other purposes; ask your health care provider or pharmacist if you have questions. COMMON BRAND NAME(S): Paraplatin What should I tell my care team before I take this medication? They need to know if you have any of these conditions: Blood disorders Hearing problems Kidney disease Recent or ongoing radiation therapy An unusual or allergic reaction to carboplatin, cisplatin, other medications, foods, dyes, or preservatives Pregnant or trying to get pregnant Breast-feeding How should I use this medication? This medication is injected into a vein. It is given by your care team in a hospital or clinic  setting. Talk to your care team about the use of this medication in children. Special care may be needed. Overdosage: If you think you have taken too much of this medicine contact a poison control center or emergency room at once. NOTE: This medicine is only for you. Do not share this medicine with others. What if I miss a dose? Keep appointments for follow-up doses. It is important not to miss your dose. Call your care team if you are unable to keep an appointment. What may interact with this medication? Medications for seizures Some antibiotics, such as amikacin, gentamicin, neomycin, streptomycin, tobramycin Vaccines This list may not describe all possible interactions. Give your health care provider a list of all the medicines, herbs, non-prescription drugs, or dietary supplements you use. Also tell them if you smoke, drink alcohol, or use illegal drugs. Some items may interact with your medicine. What should I watch for while using this medication? Your condition will be monitored carefully while you are receiving this medication. You may need blood work while taking this medication. This medication may make you feel generally unwell. This is not uncommon, as chemotherapy can affect healthy cells as well as cancer cells. Report any side effects. Continue your course of treatment even though you feel ill unless your care team tells you to stop. In some cases, you may be given additional medications to help with side effects. Follow all directions for their use. This medication may increase your risk of getting an infection. Call your care team for advice if you get a fever, chills, sore throat, or  other symptoms of a cold or flu. Do not treat yourself. Try to avoid being around people who are sick. Avoid taking medications that contain aspirin, acetaminophen, ibuprofen, naproxen, or ketoprofen unless instructed by your care team. These medications may hide a fever. Be careful brushing or  flossing your teeth or using a toothpick because you may get an infection or bleed more easily. If you have any dental work done, tell your dentist you are receiving this medication. Talk to your care team if you wish to become pregnant or think you might be pregnant. This medication can cause serious birth defects. Talk to your care team about effective forms of contraception. Do not breast-feed while taking this medication. What side effects may I notice from receiving this medication? Side effects that you should report to your care team as soon as possible: Allergic reactions--skin rash, itching, hives, swelling of the face, lips, tongue, or throat Infection--fever, chills, cough, sore throat, wounds that don't heal, pain or trouble when passing urine, general feeling of discomfort or being unwell Low red blood cell level--unusual weakness or fatigue, dizziness, headache, trouble breathing Pain, tingling, or numbness in the hands or feet, muscle weakness, change in vision, confusion or trouble speaking, loss of balance or coordination, trouble walking, seizures Unusual bruising or bleeding Side effects that usually do not require medical attention (report to your care team if they continue or are bothersome): Hair loss Nausea Unusual weakness or fatigue Vomiting This list may not describe all possible side effects. Call your doctor for medical advice about side effects. You may report side effects to FDA at 1-800-FDA-1088. Where should I keep my medication? This medication is given in a hospital or clinic. It will not be stored at home. NOTE: This sheet is a summary. It may not cover all possible information. If you have questions about this medicine, talk to your doctor, pharmacist, or health care provider.  2024 Elsevier/Gold Standard (2021-09-17 00:00:00) Paclitaxel Injection What is this medication? PACLITAXEL (PAK li TAX el) treats some types of cancer. It works by slowing down the  growth of cancer cells. This medicine may be used for other purposes; ask your health care provider or pharmacist if you have questions. COMMON BRAND NAME(S): Onxol, Taxol What should I tell my care team before I take this medication? They need to know if you have any of these conditions: Heart disease Liver disease Low white blood cell levels An unusual or allergic reaction to paclitaxel, other medications, foods, dyes, or preservatives If you or your partner are pregnant or trying to get pregnant Breast-feeding How should I use this medication? This medication is injected into a vein. It is given by your care team in a hospital or clinic setting. Talk to your care team about the use of this medication in children. While it may be given to children for selected conditions, precautions do apply. Overdosage: If you think you have taken too much of this medicine contact a poison control center or emergency room at once. NOTE: This medicine is only for you. Do not share this medicine with others. What if I miss a dose? Keep appointments for follow-up doses. It is important not to miss your dose. Call your care team if you are unable to keep an appointment. What may interact with this medication? Do not take this medication with any of the following: Live virus vaccines Other medications may affect the way this medication works. Talk with your care team about all of the medications  you take. They may suggest changes to your treatment plan to lower the risk of side effects and to make sure your medications work as intended. This list may not describe all possible interactions. Give your health care provider a list of all the medicines, herbs, non-prescription drugs, or dietary supplements you use. Also tell them if you smoke, drink alcohol, or use illegal drugs. Some items may interact with your medicine. What should I watch for while using this medication? Your condition will be monitored  carefully while you are receiving this medication. You may need blood work while taking this medication. This medication may make you feel generally unwell. This is not uncommon as chemotherapy can affect healthy cells as well as cancer cells. Report any side effects. Continue your course of treatment even though you feel ill unless your care team tells you to stop. This medication can cause serious allergic reactions. To reduce the risk, your care team may give you other medications to take before receiving this one. Be sure to follow the directions from your care team. This medication may increase your risk of getting an infection. Call your care team for advice if you get a fever, chills, sore throat, or other symptoms of a cold or flu. Do not treat yourself. Try to avoid being around people who are sick. This medication may increase your risk to bruise or bleed. Call your care team if you notice any unusual bleeding. Be careful brushing or flossing your teeth or using a toothpick because you may get an infection or bleed more easily. If you have any dental work done, tell your dentist you are receiving this medication. Talk to your care team if you may be pregnant. Serious birth defects can occur if you take this medication during pregnancy. Talk to your care team before breastfeeding. Changes to your treatment plan may be needed. What side effects may I notice from receiving this medication? Side effects that you should report to your care team as soon as possible: Allergic reactions--skin rash, itching, hives, swelling of the face, lips, tongue, or throat Heart rhythm changes--fast or irregular heartbeat, dizziness, feeling faint or lightheaded, chest pain, trouble breathing Increase in blood pressure Infection--fever, chills, cough, sore throat, wounds that don't heal, pain or trouble when passing urine, general feeling of discomfort or being unwell Low blood pressure--dizziness, feeling faint  or lightheaded, blurry vision Low red blood cell level--unusual weakness or fatigue, dizziness, headache, trouble breathing Painful swelling, warmth, or redness of the skin, blisters or sores at the infusion site Pain, tingling, or numbness in the hands or feet Slow heartbeat--dizziness, feeling faint or lightheaded, confusion, trouble breathing, unusual weakness or fatigue Unusual bruising or bleeding Side effects that usually do not require medical attention (report to your care team if they continue or are bothersome): Diarrhea Hair loss Joint pain Loss of appetite Muscle pain Nausea Vomiting This list may not describe all possible side effects. Call your doctor for medical advice about side effects. You may report side effects to FDA at 1-800-FDA-1088. Where should I keep my medication? This medication is given in a hospital or clinic. It will not be stored at home. NOTE: This sheet is a summary. It may not cover all possible information. If you have questions about this medicine, talk to your doctor, pharmacist, or health care provider.  2024 Elsevier/Gold Standard (2021-10-15 00:00:00)       To help prevent nausea and vomiting after your treatment, we encourage you to take your nausea  medication as directed.  BELOW ARE SYMPTOMS THAT SHOULD BE REPORTED IMMEDIATELY: *FEVER GREATER THAN 100.4 F (38 C) OR HIGHER *CHILLS OR SWEATING *NAUSEA AND VOMITING THAT IS NOT CONTROLLED WITH YOUR NAUSEA MEDICATION *UNUSUAL SHORTNESS OF BREATH *UNUSUAL BRUISING OR BLEEDING *URINARY PROBLEMS (pain or burning when urinating, or frequent urination) *BOWEL PROBLEMS (unusual diarrhea, constipation, pain near the anus) TENDERNESS IN MOUTH AND THROAT WITH OR WITHOUT PRESENCE OF ULCERS (sore throat, sores in mouth, or a toothache) UNUSUAL RASH, SWELLING OR PAIN  UNUSUAL VAGINAL DISCHARGE OR ITCHING   Items with * indicate a potential emergency and should be followed up as soon as possible or go to  the Emergency Department if any problems should occur.  Please show the CHEMOTHERAPY ALERT CARD or IMMUNOTHERAPY ALERT CARD at check-in to the Emergency Department and triage nurse.  Should you have questions after your visit or need to cancel or reschedule your appointment, please contact [facility] [phone]  and follow the prompts.  Office hours are 8:00 a.m. to 4:30 p.m. Monday - Friday. Please note that voicemails left after 4:00 p.m. may not be returned until the following business day.  We are closed weekends and major holidays. You have access to a nurse at all times for urgent questions. Please call the main number to the clinic [phone] and follow the prompts.  For any non-urgent questions, you may also contact your provider using MyChart. We now offer e-Visits for anyone 61 and older to request care online for non-urgent symptoms. For details visit [email or web address].   Also download the MyChart app! Go to the app store, search "MyChart", open the app, select , and log in with your MyChart username and password.

## 2023-03-23 NOTE — Progress Notes (Signed)
Nutrition Follow-up:  Patient with oropharyngeal cancer. He is receiving concurrent chemoradiation with weekly carboplatin + paclitaxel  (start 10/14). Patient is under the care of Dr. Anders Simmonds.  PEG - 10/9  Met with patient in infusion. Roommate is present. Patient is sleepy at visit s/p IV Benadryl. Patient reports he has not changed dressing s/p PEG placement. Patient has not been flushing tube with water. Says he did not know to do this. Roommate reports patient has been eating well. Patient consumed all of the Ensure samples over the weekend.    Medications: reviewed   Labs: glucose 145, BUN 25  Anthropometrics: Wt 276 lb today decreased from 281 lb 1.4 oz on 8/21  10/9 + 9/26 wts are incorrect  Estimated Energy Needs  Kcals: 2500-2800 Protein: 140-155 Fluid: >/= 2.5 L  NUTRITION DIAGNOSIS: Inadequate oral intake - continues     INTERVENTION:  Pt did not show 10/10 to have dressing changed. Assisted RN in changing bandage. Area around tube cleaned and dry, new split guaze in place Reviewed PEG care and daily water flush - teach back method used Pt tolerated 120 ml water flush  One bag of food from [employer] provided Continue eating orally at this time - will assess readiness to use feeding tube at f/u and provide bolus education as appropriate    MONITORING, EVALUATION, GOAL: wt trends, intake   NEXT VISIT: Monday October 21 during infusion

## 2023-03-23 NOTE — Assessment & Plan Note (Signed)
-  Does not report pain today -Continue Advil as needed -Is able to eat and drink without problems

## 2023-03-23 NOTE — Progress Notes (Signed)
Patient Care Team: Eustaquio Boyden, MD as PCP - General (Family Medicine) Julio Sicks, MD as Consulting Physician (Neurosurgery) Jethro Bolus, MD (Inactive) as Consulting Physician (Urology) Kathyrn Sheriff, [facility] (Inactive) as Pharmacist (Pharmacist) Juanell Fairly, RN as Case Manager [place], Clent Jacks, LCSW as Social Worker Cindie Crumbly, MD as Medical Oncologist (Medical Oncology) Therese Sarah, RN as Oncology Nurse Navigator (Medical Oncology)  Clinic Day:  03/23/2023  Referring physician: Eustaquio Boyden, MD   CHIEF COMPLAINT:  CC: Oropharyngeal squamous cell carcinoma    ASSESSMENT & PLAN:   Assessment & Plan: Patient is a 72 year old male with stage III oropharyngeal squamous cell carcinoma plan to get chemo RT.  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 Baseline labs stable. Good for treatment today.   RTC in 2 weeks  Depression -Patient stated symptoms of depression on last visit -Does not have active suicidal intentions but feels depressed and does not want to do a lot -Patient feeling better on this visit but is overwhelmed with everything going on  Neoplasm related pain -Does not report pain today -Continue Advil as needed -Is able to eat and drink without problems      The patient understands the plans discussed today and is in agreement with them.  He knows to contact our office if he develops concerns prior to his next appointment.  I provided 15 minutes of face-to-face time during this encounter and > 50% was spent counseling as documented under my assessment and plan.    Cindie Crumbly, MD  [facility] [address] [place]  [address] Dept: [phone] Dept Fax: [phone]   ONCOLOGY HISTORY:   Oncology History Overview Note  T4N2Mx Squamous Cell Carcinoma of oro pharynx   Squamous cell carcinoma of oropharynx (HCC)  01/28/2023 Imaging   CT neck:  Pharynx and larynx: Enhancing mass in the right aspect of the pharynx, which measures approximately 3.3 x 3.1 x 5.0 cm. The mass appears centered in the oropharynx near the right palatine tonsil, but extends into the hypopharynx more than into the nasopharynx. The larynx is unremarkable.  Lymph nodes: Enlarged, abnormal lymph nodes bilaterally. Representative lymph nodes include a left level 2A lymph node with central low density, which measures 2.1 x 2.5 x 3.4 cm . A right level 2A lymph node also demonstrates central low density and measures 1.8 x 1.3 x 2.4 cm. A rounded right level 1B lymph node measures up to 1.1 cm (series 3, image 41), with some central low density. Abnormally enhancing right level 3 lymph node measures 1.3 x 1.9 x 2.2 cm.    01/29/2023 Pathology Results   A. OROPHARYNGEAL MASS INVOLVING TONSIL AND RIGHT PHARYNGEAL WALL, RIGHT, BIOPSY:  Invasive squamous cell carcinoma, moderately differentiated.  P16 positive, HPV associated         01/29/2023 Procedure   Fiberoptic laryngoscopy with biopsy    01/29/2023 Tumor Marker   CARIS:  CPS: 10 Positive: p16, FGFR3 TMB low: 5mut/Mb NTRK1/2/3: Negative MSI: Stable   02/26/2023 Initial Diagnosis   Oropharyngeal carcinoma (HCC)   03/12/2023 PET scan   IMPRESSION: 1. The right base of tongue and pharyngeal mass is intensely FDG avid compatible with primary head and neck cancer. 2. There is additional tracer uptake within the left lateral  wall of the oropharynx. This is nonspecific and may be inflammatory or neoplastic in etiology.  3. Bilateral tracer avid cervical lymph nodes are identified compatible with nodal metastasis. 4. There is a 6 mm nodule within the left midlung abutting the oblique  fissure which demonstrates low level FDG uptake. Technically too small to reliably characterize by PET-CT. Attention on follow-up imaging advised. 5. Umbilical hernia contains nonobstructed loops of small bowel. 6. Coronary artery atherosclerotic calcifications.   03/18/2023 Procedure   IR guided port placement IR guided PEG tube placement   03/23/2023 -  Chemotherapy   Patient is on Treatment Plan : HEAD/NECK Carboplatin + Paclitaxel + XRT q7d         Current Treatment:  Carbo/Taxol + RT  INTERVAL HISTORY:  Gregory Fisher is here today for follow up.  Patient arrived to the clinic alone today.  He recently had an PEG tube placed and has no complications from it.  He is overall doing well but is overwhelmed from everything.  He denies fevers or chills. He denies pain. His appetite is good.  He feels okay to start chemo RT today. We discussed the PET scan results.   I have reviewed the past medical history, past surgical history, social history and family history with the patient and they are unchanged from previous note.  ALLERGIES:  has No Known Allergies.  MEDICATIONS:  Current Outpatient Medications  Medication Sig Dispense Refill   Accu-Chek Softclix Lancets lancets Use as instructed to check blood sugar 2 (times) a day 200 each 3   atorvastatin (LIPITOR) 80 MG tablet TAKE 1 TABLET BY MOUTH DAILY 100 tablet 2   Blood Glucose Monitoring Suppl (ACCU-CHEK GUIDE ME) w/Device KIT Use as instructed to check blood sugar 2 (times) a day 1 kit 0   CARBOPLATIN IV Inject into the vein once a week.     carvedilol (COREG) 12.5 MG tablet TAKE 1 TABLET BY MOUTH TWICE  DAILY WITH MEALS 200 tablet 2   Cholecalciferol (VITAMIN D3) 25 MCG (1000 UT) CAPS Take 2 capsules (2,000 Units total) by mouth daily. 30 capsule    cloNIDine (CATAPRES) 0.1 MG tablet TAKE 1 TABLET BY MOUTH TWICE  DAILY 200 tablet 2   dapagliflozin propanediol (FARXIGA) 5 MG TABS tablet Take 1 tablet (5 mg total) by mouth daily before  breakfast. 90 tablet 3   dexamethasone (DECADRON) 4 MG tablet Take 2 tablets daily for 2 days, start the day after chemotherapy. Take with food. 30 tablet 1   furosemide (LASIX) 40 MG tablet TAKE 1 TABLET BY MOUTH EVERY DAY 90 tablet 1   glucose blood (ACCU-CHEK GUIDE) test strip Use as instructed to check blood sugar 2 (times) a day 200 strip 3   KLOR-CON M10 10 MEQ tablet TAKE 1 TABLET BY MOUTH TWICE A DAY 180 tablet 1   lidocaine-prilocaine (EMLA) cream Apply a quarter-sized amount to port a cath site and cover with plastic wrap 1 hour prior to infusion appointments 30 g 3   LORazepam (ATIVAN) 1 MG tablet TAKE 1 TABLET BY MOUTH EVERY DAY AS NEEDED FOR ANXIETY 20 tablet 0   metFORMIN (GLUCOPHAGE) 500 MG tablet TAKE 1 TABLET BY MOUTH TWICE  DAILY WITH MEALS 200 tablet 2   ondansetron (ZOFRAN) 8 MG tablet Take 1 tablet (8 mg total) by mouth every 8 (eight) hours as needed for nausea or vomiting. Start on the third day after chemotherapy. 30 tablet 1   PACLitaxel (TAXOL IV) Inject into the vein once a  week.     sodium chloride (OCEAN) 0.65 % SOLN nasal spray Place 1 spray into both nostrils as needed. 30 mL 5   tamsulosin (FLOMAX) 0.4 MG CAPS capsule TAKE 2 CAPSULES BY MOUTH AT  BEDTIME 200 capsule 2   valsartan (DIOVAN) 320 MG tablet Take 0.5 tablets (160 mg total) by mouth daily.     No current facility-administered medications for this visit.   Facility-Administered Medications Ordered in Other Visits  Medication Dose Route Frequency Provider Last Rate Last Admin   CARBOplatin (PARAPLATIN) 290 mg in sodium chloride 0.9 % 100 mL chemo infusion  290 mg Intravenous Once Cindie Crumbly, MD       heparin lock flush 100 unit/mL  500 Units Intracatheter Once PRN Cindie Crumbly, MD       PACLitaxel (TAXOL) 108 mg in sodium chloride 0.9 % 250 mL chemo infusion (</= 80mg /m2)  45 mg/m2 (Treatment Plan Recorded) Intravenous Once Cindie Crumbly, MD 268 mL/hr at 03/23/23 1102 108 mg at 03/23/23 1102    sodium chloride flush (NS) 0.9 % injection 10 mL  10 mL Intracatheter PRN Cindie Crumbly, MD        REVIEW OF SYSTEMS:   Constitutional: Denies fevers, chills or abnormal weight loss Eyes: Denies blurriness of vision Ears, nose, mouth, throat, and face: Denies mucositis or sore throat Respiratory: Denies cough, dyspnea or wheezes Cardiovascular: Denies palpitation, chest discomfort or lower extremity swelling Gastrointestinal:  Denies nausea, heartburn or change in bowel habits Skin: Denies abnormal skin rashes Lymphatics: Denies new lymphadenopathy or easy bruising Neurological:Denies numbness, tingling or new weaknesses Behavioral/Psych: Mood is stable, no new changes  All other systems were reviewed with the patient and are negative.   VITALS:  Weight 276 lb (125.2 kg).  Wt Readings from Last 3 Encounters:  03/23/23 276 lb (125.2 kg)  03/18/23 256 lb (116.1 kg)  03/05/23 256 lb (116.1 kg)    Body mass index is 43.88 kg/m.  Performance status (ECOG): 2 - Symptomatic, <50% confined to bed  PHYSICAL EXAM:   GENERAL:alert, no distress and comfortable, patient is unkempt  OROPHARYNX: Palpable hard mass in the right side at least 5 cm x 3cms, nontender, no discharge, no ulceration noted.   NECK: supple, thyroid normal size, non-tender, Palpable lymph node 1 cm in the right IIa  location, non tender, immobile. LYMPH:  no palpable lymphadenopathy in tother cervical, axillary or inguinal LUNGS: clear to auscultation and percussion with normal breathing effort, port in place- no erythema or discharge HEART: regular rate & rhythm and no murmurs and no lower extremity edema ABDOMEN:abdomen soft, non-tender and normal bowel sounds, PEG tube in place with dressing Musculoskeletal:no cyanosis of digits and no clubbing  PSYCH: alert & oriented x 3 with fluent speech NEURO: no focal motor/sensory deficits  LABORATORY DATA:  I have reviewed the data as listed    Component Value  Date/Time   NA 135 03/23/2023 0801   NA 142 02/05/2012 0000   K 3.9 03/23/2023 0801   K 4.0 02/05/2012 0000   CL 102 03/23/2023 0801   CO2 26 03/23/2023 0801   GLUCOSE 145 (H) 03/23/2023 0801   BUN 25 (H) 03/23/2023 0801   CREATININE 0.88 03/23/2023 0801   CREATININE 1.0 02/05/2012 0000   CALCIUM 8.8 (L) 03/23/2023 0801   CALCIUM 10.2 02/05/2012 0000   PROT 7.3 03/23/2023 0801   ALBUMIN 3.5 03/23/2023 0801   AST 15 03/23/2023 0801   AST 21 02/05/2012 0000   ALT 15 03/23/2023 0801  ALKPHOS 90 03/23/2023 0801   ALKPHOS 62 02/05/2012 0000   BILITOT 0.7 03/23/2023 0801   BILITOT 0.7 02/05/2012 0000   GFRNONAA >60 03/23/2023 0801   GFRAA >60 11/21/2016 1100    Lab Results  Component Value Date   WBC 10.8 (H) 03/23/2023   NEUTROABS 8.4 (H) 03/23/2023   HGB 13.6 03/23/2023   HCT 40.6 03/23/2023   MCV 90.0 03/23/2023   PLT 277 03/23/2023      Chemistry      Component Value Date/Time   NA 135 03/23/2023 0801   NA 142 02/05/2012 0000   K 3.9 03/23/2023 0801   K 4.0 02/05/2012 0000   CL 102 03/23/2023 0801   CO2 26 03/23/2023 0801   BUN 25 (H) 03/23/2023 0801   CREATININE 0.88 03/23/2023 0801   CREATININE 1.0 02/05/2012 0000      Component Value Date/Time   CALCIUM 8.8 (L) 03/23/2023 0801   CALCIUM 10.2 02/05/2012 0000   ALKPHOS 90 03/23/2023 0801   ALKPHOS 62 02/05/2012 0000   AST 15 03/23/2023 0801   AST 21 02/05/2012 0000   ALT 15 03/23/2023 0801   BILITOT 0.7 03/23/2023 0801   BILITOT 0.7 02/05/2012 0000       RADIOGRAPHIC STUDIES: I have personally reviewed the radiological images as listed and agreed with the findings in the report.  PET scan: IMPRESSION: 1. The right base of tongue and pharyngeal mass is intensely FDG avid compatible with primary head and neck cancer. 2. There is additional tracer uptake within the left lateral wall of the oropharynx. This is nonspecific and may be inflammatory or neoplastic in etiology. Correlation with direct  visualization advised. 3. Bilateral tracer avid cervical lymph nodes are identified compatible with nodal metastasis. 4. There is a 6 mm nodule within the left midlung abutting the oblique fissure which demonstrates low level FDG uptake. Technically too small to reliably characterize by PET-CT. Attention on follow-up imaging advised. 5. Umbilical hernia contains nonobstructed loops of small bowel. 6. Coronary artery atherosclerotic calcifications. 7.  Aortic Atherosclerosis (ICD10-I70.0).

## 2023-03-24 ENCOUNTER — Ambulatory Visit (INDEPENDENT_AMBULATORY_CARE_PROVIDER_SITE_OTHER): Payer: Medicare HMO | Admitting: Otolaryngology

## 2023-03-24 ENCOUNTER — Telehealth: Payer: Self-pay | Admitting: *Deleted

## 2023-03-24 DIAGNOSIS — C770 Secondary and unspecified malignant neoplasm of lymph nodes of head, face and neck: Secondary | ICD-10-CM | POA: Diagnosis not present

## 2023-03-24 DIAGNOSIS — C108 Malignant neoplasm of overlapping sites of oropharynx: Secondary | ICD-10-CM | POA: Diagnosis not present

## 2023-03-24 DIAGNOSIS — J359 Chronic disease of tonsils and adenoids, unspecified: Secondary | ICD-10-CM | POA: Diagnosis not present

## 2023-03-24 NOTE — Telephone Encounter (Signed)
24 hour chemotherapy call placed today. Pt stated he felt good and voiced no new complaints at this time. Pt advised to call the clinic if needed. Patient verbalized understanding.

## 2023-03-24 NOTE — Addendum Note (Signed)
Addended byCindie Crumbly on: 03/24/2023 08:13 AM   Modules accepted: Orders

## 2023-03-25 DIAGNOSIS — C108 Malignant neoplasm of overlapping sites of oropharynx: Secondary | ICD-10-CM | POA: Diagnosis not present

## 2023-03-25 DIAGNOSIS — C770 Secondary and unspecified malignant neoplasm of lymph nodes of head, face and neck: Secondary | ICD-10-CM | POA: Diagnosis not present

## 2023-03-25 DIAGNOSIS — J359 Chronic disease of tonsils and adenoids, unspecified: Secondary | ICD-10-CM | POA: Diagnosis not present

## 2023-03-26 ENCOUNTER — Telehealth: Payer: Self-pay | Admitting: *Deleted

## 2023-03-26 ENCOUNTER — Inpatient Hospital Stay: Payer: Medicare HMO | Admitting: Licensed Clinical Social Worker

## 2023-03-26 DIAGNOSIS — C109 Malignant neoplasm of oropharynx, unspecified: Secondary | ICD-10-CM

## 2023-03-26 DIAGNOSIS — C770 Secondary and unspecified malignant neoplasm of lymph nodes of head, face and neck: Secondary | ICD-10-CM | POA: Diagnosis not present

## 2023-03-26 DIAGNOSIS — J359 Chronic disease of tonsils and adenoids, unspecified: Secondary | ICD-10-CM | POA: Diagnosis not present

## 2023-03-26 DIAGNOSIS — C108 Malignant neoplasm of overlapping sites of oropharynx: Secondary | ICD-10-CM | POA: Diagnosis not present

## 2023-03-26 NOTE — Telephone Encounter (Signed)
Maralyn Sago from [facility] called to advise that patient has refused services at this time.

## 2023-03-27 ENCOUNTER — Encounter: Payer: Self-pay | Admitting: Oncology

## 2023-03-27 DIAGNOSIS — J359 Chronic disease of tonsils and adenoids, unspecified: Secondary | ICD-10-CM | POA: Diagnosis not present

## 2023-03-27 DIAGNOSIS — C108 Malignant neoplasm of overlapping sites of oropharynx: Secondary | ICD-10-CM | POA: Diagnosis not present

## 2023-03-27 DIAGNOSIS — C770 Secondary and unspecified malignant neoplasm of lymph nodes of head, face and neck: Secondary | ICD-10-CM | POA: Diagnosis not present

## 2023-03-30 ENCOUNTER — Inpatient Hospital Stay: Payer: Medicare HMO

## 2023-03-30 ENCOUNTER — Encounter: Payer: Self-pay | Admitting: Oncology

## 2023-03-30 ENCOUNTER — Inpatient Hospital Stay: Payer: Medicare HMO | Admitting: Dietician

## 2023-03-30 ENCOUNTER — Inpatient Hospital Stay (HOSPITAL_BASED_OUTPATIENT_CLINIC_OR_DEPARTMENT_OTHER): Payer: Medicare HMO | Admitting: Oncology

## 2023-03-30 VITALS — BP 112/67 | HR 95 | Temp 97.7°F | Resp 20 | Wt 254.6 lb

## 2023-03-30 DIAGNOSIS — T85848A Pain due to other internal prosthetic devices, implants and grafts, initial encounter: Secondary | ICD-10-CM | POA: Insufficient documentation

## 2023-03-30 DIAGNOSIS — F329 Major depressive disorder, single episode, unspecified: Secondary | ICD-10-CM

## 2023-03-30 DIAGNOSIS — F32A Depression, unspecified: Secondary | ICD-10-CM | POA: Diagnosis not present

## 2023-03-30 DIAGNOSIS — K5900 Constipation, unspecified: Secondary | ICD-10-CM | POA: Insufficient documentation

## 2023-03-30 DIAGNOSIS — G893 Neoplasm related pain (acute) (chronic): Secondary | ICD-10-CM | POA: Diagnosis not present

## 2023-03-30 DIAGNOSIS — Z5111 Encounter for antineoplastic chemotherapy: Secondary | ICD-10-CM | POA: Diagnosis not present

## 2023-03-30 DIAGNOSIS — C109 Malignant neoplasm of oropharynx, unspecified: Secondary | ICD-10-CM

## 2023-03-30 DIAGNOSIS — C108 Malignant neoplasm of overlapping sites of oropharynx: Secondary | ICD-10-CM | POA: Diagnosis not present

## 2023-03-30 DIAGNOSIS — C770 Secondary and unspecified malignant neoplasm of lymph nodes of head, face and neck: Secondary | ICD-10-CM | POA: Diagnosis not present

## 2023-03-30 DIAGNOSIS — J359 Chronic disease of tonsils and adenoids, unspecified: Secondary | ICD-10-CM | POA: Diagnosis not present

## 2023-03-30 LAB — CBC WITH DIFFERENTIAL/PLATELET
Abs Immature Granulocytes: 0.03 10*3/uL (ref 0.00–0.07)
Basophils Absolute: 0 10*3/uL (ref 0.0–0.1)
Basophils Relative: 0 %
Eosinophils Absolute: 0.1 10*3/uL (ref 0.0–0.5)
Eosinophils Relative: 3 %
HCT: 39.6 % (ref 39.0–52.0)
Hemoglobin: 13.2 g/dL (ref 13.0–17.0)
Immature Granulocytes: 1 %
Lymphocytes Relative: 6 %
Lymphs Abs: 0.3 10*3/uL — ABNORMAL LOW (ref 0.7–4.0)
MCH: 30 pg (ref 26.0–34.0)
MCHC: 33.3 g/dL (ref 30.0–36.0)
MCV: 90 fL (ref 80.0–100.0)
Monocytes Absolute: 0.7 10*3/uL (ref 0.1–1.0)
Monocytes Relative: 15 %
Neutro Abs: 3.4 10*3/uL (ref 1.7–7.7)
Neutrophils Relative %: 75 %
Platelets: 252 10*3/uL (ref 150–400)
RBC: 4.4 MIL/uL (ref 4.22–5.81)
RDW: 13.3 % (ref 11.5–15.5)
WBC: 4.5 10*3/uL (ref 4.0–10.5)
nRBC: 0 % (ref 0.0–0.2)

## 2023-03-30 LAB — MAGNESIUM: Magnesium: 2.3 mg/dL (ref 1.7–2.4)

## 2023-03-30 LAB — COMPREHENSIVE METABOLIC PANEL
ALT: 19 U/L (ref 0–44)
AST: 21 U/L (ref 15–41)
Albumin: 3.1 g/dL — ABNORMAL LOW (ref 3.5–5.0)
Alkaline Phosphatase: 68 U/L (ref 38–126)
Anion gap: 8 (ref 5–15)
BUN: 17 mg/dL (ref 8–23)
CO2: 26 mmol/L (ref 22–32)
Calcium: 8.2 mg/dL — ABNORMAL LOW (ref 8.9–10.3)
Chloride: 97 mmol/L — ABNORMAL LOW (ref 98–111)
Creatinine, Ser: 0.84 mg/dL (ref 0.61–1.24)
GFR, Estimated: >60 mL/min (ref >60)
Glucose, Bld: 122 mg/dL — ABNORMAL HIGH (ref 70–99)
Potassium: 3.8 mmol/L (ref 3.5–5.1)
Sodium: 131 mmol/L — ABNORMAL LOW (ref 135–145)
Total Bilirubin: 0.4 mg/dL (ref 0.3–1.2)
Total Protein: 7 g/dL (ref 6.5–8.1)

## 2023-03-30 MED ORDER — CARBOPLATIN CHEMO INJECTION 450 MG/45ML
294.4000 mg | Freq: Once | INTRAVENOUS | Status: AC
  Administered 2023-03-30: 290 mg via INTRAVENOUS
  Filled 2023-03-30: qty 29

## 2023-03-30 MED ORDER — FAMOTIDINE IN NACL 20-0.9 MG/50ML-% IV SOLN
20.0000 mg | Freq: Once | INTRAVENOUS | Status: AC
  Administered 2023-03-30: 20 mg via INTRAVENOUS
  Filled 2023-03-30: qty 50

## 2023-03-30 MED ORDER — SODIUM CHLORIDE 0.9 % IV SOLN
45.0000 mg/m2 | Freq: Once | INTRAVENOUS | Status: AC
  Administered 2023-03-30: 108 mg via INTRAVENOUS
  Filled 2023-03-30: qty 18

## 2023-03-30 MED ORDER — PALONOSETRON HCL INJECTION 0.25 MG/5ML
0.2500 mg | Freq: Once | INTRAVENOUS | Status: AC
  Administered 2023-03-30: 0.25 mg via INTRAVENOUS
  Filled 2023-03-30: qty 5

## 2023-03-30 MED ORDER — SODIUM CHLORIDE 0.9% FLUSH
10.0000 mL | Freq: Once | INTRAVENOUS | Status: AC
  Administered 2023-03-30: 10 mL via INTRAVENOUS

## 2023-03-30 MED ORDER — HEPARIN SOD (PORK) LOCK FLUSH 100 UNIT/ML IV SOLN
500.0000 [IU] | Freq: Once | INTRAVENOUS | Status: AC | PRN
  Administered 2023-03-30: 500 [IU]

## 2023-03-30 MED ORDER — SENNA 8.6 MG PO TABS: 1.0000 | ORAL_TABLET | Freq: Every day | ORAL | 0 refills | Status: DC

## 2023-03-30 MED ORDER — ALTEPLASE 2 MG IJ SOLR
2.0000 mg | Freq: Once | INTRAMUSCULAR | Status: AC | PRN
  Administered 2023-03-30: 2 mg
  Filled 2023-03-30: qty 2

## 2023-03-30 MED ORDER — SODIUM CHLORIDE 0.9 % IV SOLN: Freq: Once | INTRAVENOUS | Status: AC

## 2023-03-30 MED ORDER — SODIUM CHLORIDE 0.9 % IV SOLN: 10.0000 mg | Freq: Once | INTRAVENOUS | Status: DC

## 2023-03-30 MED ORDER — DEXAMETHASONE SODIUM PHOSPHATE 10 MG/ML IJ SOLN
10.0000 mg | Freq: Once | INTRAMUSCULAR | Status: AC
Start: 2023-03-30 — End: 2023-03-30
  Administered 2023-03-30: 10 mg via INTRAVENOUS
  Filled 2023-03-30: qty 1

## 2023-03-30 MED ORDER — DOXYCYCLINE HYCLATE 100 MG PO TABS: 100.0000 mg | ORAL_TABLET | Freq: Two times a day (BID) | ORAL | 0 refills | Status: AC

## 2023-03-30 MED ORDER — DIPHENHYDRAMINE HCL 50 MG/ML IJ SOLN
50.0000 mg | Freq: Once | INTRAMUSCULAR | Status: AC
  Administered 2023-03-30: 50 mg via INTRAVENOUS
  Filled 2023-03-30: qty 1

## 2023-03-30 NOTE — Assessment & Plan Note (Signed)
Patient has some inflammation and pus around the PEG tube site -Will prescribe antibiotics for this.  Reassess on next visit

## 2023-03-30 NOTE — Progress Notes (Signed)
Patient Care Team: Eustaquio Boyden, MD as PCP - General (Family Medicine) Julio Sicks, MD as Consulting Physician (Neurosurgery) Jethro Bolus, MD (Inactive) as Consulting Physician (Urology) Kathyrn Sheriff, [facility] (Inactive) as Pharmacist (Pharmacist) Juanell Fairly, RN as Case Manager [place], Clent Jacks, LCSW as Social Worker Cindie Crumbly, MD as Medical Oncologist (Medical Oncology) Therese Sarah, RN as Oncology Nurse Navigator (Medical Oncology)  Clinic Day:  03/30/2023  Referring physician: Eustaquio Boyden, MD   CHIEF COMPLAINT:  CC: Oropharyngeal squamous cell carcinoma    ASSESSMENT & PLAN:   Assessment & Plan: Fisher REYNEN  is a 72 y.o. 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 Baseline labs stable. Good for treatment today.   RTC in 2 weeks  Depression -Patient stated symptoms of depression on last visit -Does not have active suicidal intentions but feels depressed and does not want to do a lot -Patient feeling better on this visit but is overwhelmed with everything going on  Pain around PEG tube site Patient has some inflammation and pus around the PEG tube site -Will prescribe antibiotics for this.  Reassess on next visit  Constipation Patient reported some constipation even though he had a bowel movement while in the clinic -Will send a prescription for stool softeners    The patient understands the plans discussed today and is in agreement with them.  He knows to contact our office if he develops concerns prior to his next appointment.  I provided 20 minutes of face-to-face time during this encounter and > 50% was spent counseling as  documented under my assessment and plan.    Cindie Crumbly, MD  [facility] [address] [place] [address] Dept: [phone] Dept Fax: [phone]    ONCOLOGY HISTORY:   Oncology History Overview Note  T4N2Mx Squamous Cell Carcinoma of oro pharynx   Squamous cell carcinoma of oropharynx (HCC)  01/28/2023 Imaging   CT neck:  Pharynx and larynx: Enhancing mass in the right aspect of the pharynx, which measures approximately 3.3 x 3.1 x 5.0 cm. The mass appears centered in the oropharynx near the right palatine tonsil, but extends into the hypopharynx more than into the nasopharynx. The larynx is unremarkable.  Lymph nodes: Enlarged, abnormal lymph nodes bilaterally. Representative lymph nodes include a left level 2A lymph node with central low density, which measures 2.1 x 2.5 x 3.4 cm . A right level 2A lymph node also demonstrates central low density and measures 1.8 x 1.3 x 2.4 cm. A rounded right level 1B lymph node measures up to 1.1 cm (series 3, image 41), with some central low density. Abnormally enhancing right level 3 lymph node measures 1.3 x 1.9 x 2.2 cm.    01/29/2023 Pathology Results   A. OROPHARYNGEAL MASS INVOLVING TONSIL AND RIGHT PHARYNGEAL WALL, RIGHT, BIOPSY:  Invasive squamous cell carcinoma, moderately differentiated.  P16 positive, HPV associated         01/29/2023 Procedure   Fiberoptic laryngoscopy with biopsy    01/29/2023 Tumor Marker   CARIS:  CPS: 10 Positive: p16, FGFR3 TMB low: 72mut/Mb NTRK1/2/3: Negative MSI: Stable   02/26/2023 Initial Diagnosis   Oropharyngeal carcinoma (HCC)   03/12/2023 PET scan   IMPRESSION: 1. The  right base of tongue and pharyngeal mass is intensely FDG avid compatible with primary head and neck cancer. 2. There is additional tracer uptake within the left lateral wall of the oropharynx. This is nonspecific and may be inflammatory or neoplastic in  etiology.  3. Bilateral tracer avid cervical lymph nodes are identified compatible with nodal metastasis. 4. There is a 6 mm nodule within the left midlung abutting the oblique fissure which demonstrates low level FDG uptake. Technically too small to reliably characterize by PET-CT. Attention on follow-up imaging advised. 5. Umbilical hernia contains nonobstructed loops of small bowel. 6. Coronary artery atherosclerotic calcifications.   03/18/2023 Procedure   IR guided port placement IR guided PEG tube placement   03/23/2023 -  Chemotherapy   Patient is on Treatment Plan : HEAD/NECK Carboplatin + Paclitaxel + XRT q7d         Current Treatment: Chemo RT with carboplatin plus paclitaxel  INTERVAL HISTORY:  Gregory Fisher is here today for follow up.  Patient reported that he flushed his PEG tube twice today and is still anemic at.  He is still overwhelmed by everything that is happening.  Since the last visit patient was diagnosed of bedbug infestation.  He reports having some difficulty with swallowing now.  He denies fevers or chills. His appetite is good. His weight has been stable.  I have reviewed the past medical history, past surgical history, social history and family history with the patient and they are unchanged from previous note.  ALLERGIES:  has No Known Allergies.  MEDICATIONS:  Current Outpatient Medications  Medication Sig Dispense Refill   Accu-Chek Softclix Lancets lancets Use as instructed to check blood sugar 2 (times) a day 200 each 3   atorvastatin (LIPITOR) 80 MG tablet TAKE 1 TABLET BY MOUTH DAILY 100 tablet 2   Blood Glucose Monitoring Suppl (ACCU-CHEK GUIDE ME) w/Device KIT Use as instructed to check blood sugar 2 (times) a day 1 kit 0   CARBOPLATIN IV Inject into the vein once a week.     carvedilol (COREG) 12.5 MG tablet TAKE 1 TABLET BY MOUTH TWICE  DAILY WITH MEALS 200 tablet 2   Cholecalciferol (VITAMIN D3) 25 MCG (1000 UT) CAPS Take 2 capsules (2,000  Units total) by mouth daily. 30 capsule    cloNIDine (CATAPRES) 0.1 MG tablet TAKE 1 TABLET BY MOUTH TWICE  DAILY 200 tablet 2   dapagliflozin propanediol (FARXIGA) 5 MG TABS tablet Take 1 tablet (5 mg total) by mouth daily before breakfast. 90 tablet 3   dexamethasone (DECADRON) 4 MG tablet Take 2 tablets daily for 2 days, start the day after chemotherapy. Take with food. 30 tablet 1   doxycycline (VIBRA-TABS) 100 MG tablet Take 1 tablet (100 mg total) by mouth 2 (two) times daily for 10 days. 20 tablet 0   furosemide (LASIX) 40 MG tablet TAKE 1 TABLET BY MOUTH EVERY DAY 90 tablet 1   glucose blood (ACCU-CHEK GUIDE) test strip Use as instructed to check blood sugar 2 (times) a day 200 strip 3   KLOR-CON M10 10 MEQ tablet TAKE 1 TABLET BY MOUTH TWICE A DAY 180 tablet 1   lidocaine-prilocaine (EMLA) cream Apply a quarter-sized amount to port a cath site and cover with plastic wrap 1 hour prior to infusion appointments 30 g 3   LORazepam (ATIVAN) 1 MG tablet TAKE 1 TABLET BY MOUTH EVERY DAY AS NEEDED FOR ANXIETY 20 tablet 0   metFORMIN (GLUCOPHAGE) 500 MG tablet TAKE 1 TABLET  BY MOUTH TWICE  DAILY WITH MEALS 200 tablet 2   ondansetron (ZOFRAN) 8 MG tablet Take 1 tablet (8 mg total) by mouth every 8 (eight) hours as needed for nausea or vomiting. Start on the third day after chemotherapy. 30 tablet 1   PACLitaxel (TAXOL IV) Inject into the vein once a week.     senna (SENOKOT) 8.6 MG TABS tablet Take 1 tablet (8.6 mg total) by mouth at bedtime. 120 tablet 0   sodium chloride (OCEAN) 0.65 % SOLN nasal spray Place 1 spray into both nostrils as needed. 30 mL 5   tamsulosin (FLOMAX) 0.4 MG CAPS capsule TAKE 2 CAPSULES BY MOUTH AT  BEDTIME 200 capsule 2   valsartan (DIOVAN) 320 MG tablet Take 0.5 tablets (160 mg total) by mouth daily.     No current facility-administered medications for this visit.   Facility-Administered Medications Ordered in Other Visits  Medication Dose Route Frequency Provider Last  Rate Last Admin   heparin lock flush 100 unit/mL  500 Units Intracatheter Once PRN Cindie Crumbly, MD           REVIEW OF SYSTEMS:   Constitutional: Denies fevers, chills or abnormal weight loss Eyes: Denies blurriness of vision Ears, nose, mouth, throat, and face: Denies mucositis or sore throat Respiratory: Denies cough, dyspnea or wheezes Cardiovascular: Denies palpitation, chest discomfort or lower extremity swelling Gastrointestinal:  Denies nausea, heartburn or change in bowel habits Skin: Denies abnormal skin rashes Lymphatics: Denies new lymphadenopathy or easy bruising Neurological:Denies numbness, tingling or new weaknesses Behavioral/Psych: Mood is stable, no new changes  All other systems were reviewed with the patient and are negative.   VITALS:  There were no vitals taken for this visit.  Wt Readings from Last 3 Encounters:  03/30/23 254 lb 9.6 oz (115.5 kg)  03/23/23 276 lb (125.2 kg)  03/18/23 256 lb (116.1 kg)     Performance status (ECOG):  2 - Symptomatic, <50% confined to bed   PHYSICAL EXAM:   GENERAL:alert, no distress and comfortable, patient is unkempt  OROPHARYNX: Palpable hard mass in the right side at least 5 cm x 3cms, nontender, no discharge, no ulceration noted.   NECK: supple, thyroid normal size, non-tender, Palpable lymph node 1 cm in the right IIa  location, non tender, immobile. LYMPH:  no palpable lymphadenopathy in tother cervical, axillary or inguinal LUNGS: clear to auscultation and percussion with normal breathing effort, port in place- no erythema or discharge HEART: regular rate & rhythm and no murmurs and no lower extremity edema ABDOMEN:abdomen soft, non-tender and normal bowel sounds, PEG tube in place with dressing, inflammation/cellulitis around the PEG tube site Musculoskeletal:no cyanosis of digits and no clubbing  PSYCH: alert & oriented x 3 with fluent speech NEURO: no focal motor/sensory deficits  LABORATORY DATA:  I  have reviewed the data as listed    Component Value Date/Time   NA 131 (L) 03/30/2023 1000   NA 142 02/05/2012 0000   K 3.8 03/30/2023 1000   K 4.0 02/05/2012 0000   CL 97 (L) 03/30/2023 1000   CO2 26 03/30/2023 1000   GLUCOSE 122 (H) 03/30/2023 1000   BUN 17 03/30/2023 1000   CREATININE 0.84 03/30/2023 1000   CREATININE 1.0 02/05/2012 0000   CALCIUM 8.2 (L) 03/30/2023 1000   CALCIUM 10.2 02/05/2012 0000   PROT 7.0 03/30/2023 1000   ALBUMIN 3.1 (L) 03/30/2023 1000   AST 21 03/30/2023 1000   AST 21 02/05/2012 0000   ALT 19 03/30/2023  1000   ALKPHOS 68 03/30/2023 1000   ALKPHOS 62 02/05/2012 0000   BILITOT 0.4 03/30/2023 1000   BILITOT 0.7 02/05/2012 0000   GFRNONAA >60 03/30/2023 1000   GFRAA >60 11/21/2016 1100    Lab Results  Component Value Date   WBC 4.5 03/30/2023   NEUTROABS 3.4 03/30/2023   HGB 13.2 03/30/2023   HCT 39.6 03/30/2023   MCV 90.0 03/30/2023   PLT 252 03/30/2023      Chemistry      Component Value Date/Time   NA 131 (L) 03/30/2023 1000   NA 142 02/05/2012 0000   K 3.8 03/30/2023 1000   K 4.0 02/05/2012 0000   CL 97 (L) 03/30/2023 1000   CO2 26 03/30/2023 1000   BUN 17 03/30/2023 1000   CREATININE 0.84 03/30/2023 1000   CREATININE 1.0 02/05/2012 0000      Component Value Date/Time   CALCIUM 8.2 (L) 03/30/2023 1000   CALCIUM 10.2 02/05/2012 0000   ALKPHOS 68 03/30/2023 1000   ALKPHOS 62 02/05/2012 0000   AST 21 03/30/2023 1000   AST 21 02/05/2012 0000   ALT 19 03/30/2023 1000   BILITOT 0.4 03/30/2023 1000   BILITOT 0.7 02/05/2012 0000       RADIOGRAPHIC STUDIES: I have personally reviewed the radiological images as listed and agreed with the findings in the report. IR Gastrostomy Tube  Result Date: 03/18/2023 INDICATION: History of head neck cancer. In need of gastrostomy tube placement for enteric nutrition supplementation purposes prior to initiating chemoradiation. EXAM: PULL TROUGH GASTROSTOMY TUBE PLACEMENT COMPARISON:   PET-CT-03/12/2023 MEDICATIONS: Glucagon 1 mg IV CONTRAST:  15 mL of Omnipaque 300 administered into the gastric lumen. ANESTHESIA/SEDATION: Moderate (conscious) sedation was employed during this procedure. A total of Versed 0.5 mg and Fentanyl 25 mcg was administered intravenously. Moderate Sedation Time: 10 minutes. The patient's level of consciousness and vital signs were monitored continuously by radiology nursing throughout the procedure under my direct supervision. FLUOROSCOPY TIME:  1 minute, 6 seconds (50 mGy) COMPLICATIONS: None immediate. PROCEDURE: Informed written consent was obtained from the patient following explanation of the procedure, risks, benefits and alternatives. A time out was performed prior to the initiation of the procedure. Ultrasound scanning was performed to demarcate the edge of the left lobe of the liver. Maximal barrier sterile technique utilized including caps, mask, sterile gowns, sterile gloves, large sterile drape, hand hygiene and Betadine prep. The left upper quadrant was sterilely prepped and draped. An oral gastric catheter was inserted into the stomach under fluoroscopy. The existing nasogastric feeding tube was removed. The left costal margin and air opacified transverse colon were identified and avoided. Air was injected into the stomach for insufflation and visualization under fluoroscopy. Under sterile conditions a 17 gauge trocar needle was utilized to access the stomach percutaneously beneath the left subcostal margin after the overlying soft tissues were anesthetized with 1% Lidocaine with epinephrine. Needle position was confirmed within the stomach with aspiration of air and injection of small amount of contrast. A single T tack was deployed for gastropexy. Over an Amplatz guide wire, a 9-French sheath was inserted into the stomach. A snare device was utilized to capture the oral gastric catheter. The snare device was pulled retrograde from the stomach up the  esophagus and out the oropharynx. The 20-French pull-through gastrostomy was connected to the snare device and pulled antegrade through the oropharynx down the esophagus into the stomach and then through the percutaneous tract external to the patient. The gastrostomy was  assembled externally. Contrast injection confirms appropriate positioning within the stomach. Several spot radiographic images were obtained in various obliquities for documentation. Dressings were applied. The patient tolerated procedure well without immediate post procedural complication. FINDINGS: After successful fluoroscopic guided placement, the gastrostomy tube is appropriately positioned with internal disc positioned against the inner ventral wall of the gastric lumen. IMPRESSION: Successful fluoroscopic insertion of a 20-French pull-through gastrostomy tube. The gastrostomy may be used immediately for medication administration and in 24 hrs for the initiation of feeds. Electronically Signed   By: Simonne Come M.D.   On: 03/18/2023 15:02   IR IMAGING GUIDED PORT INSERTION  Result Date: 03/18/2023 INDICATION: History of head neck cancer. In need of durable intravenous access for chemotherapy administration. EXAM: IMPLANTED PORT A CATH PLACEMENT WITH ULTRASOUND AND FLUOROSCOPIC GUIDANCE COMPARISON:  PET-CT-03/11/2023 MEDICATIONS: None ANESTHESIA/SEDATION: Moderate (conscious) sedation was employed during this procedure as administered by the Interventional Radiology RN. A total of Versed 1 mg and Fentanyl 50 mcg was administered intravenously. Moderate Sedation Time: 24 minutes. The patient's level of consciousness and vital signs were monitored continuously by radiology nursing throughout the procedure under my direct supervision. CONTRAST:  None FLUOROSCOPY TIME:  30 seconds (9 mGy) COMPLICATIONS: None immediate. PROCEDURE: The procedure, risks, benefits, and alternatives were explained to the patient. Questions regarding the procedure  were encouraged and answered. The patient understands and consents to the procedure. The right neck and chest were prepped with chlorhexidine in a sterile fashion, and a sterile drape was applied covering the operative field. Maximum barrier sterile technique with sterile gowns and gloves were used for the procedure. A timeout was performed prior to the initiation of the procedure. Local anesthesia was provided with 1% lidocaine with epinephrine. After creating a small venotomy incision, a micropuncture kit was utilized to access the internal jugular vein. Real-time ultrasound guidance was utilized for vascular access including the acquisition of a permanent ultrasound image documenting patency of the accessed vessel. The microwire was utilized to measure appropriate catheter length. A subcutaneous port pocket was then created along the upper chest wall utilizing a combination of sharp and blunt dissection. The pocket was irrigated with sterile saline. A single lumen [employer] power injectable port was chosen for placement. The 8 Fr catheter was tunneled from the port pocket site to the venotomy incision. The port was placed in the pocket. The external catheter was trimmed to appropriate length. At the venotomy, an 8 Fr peel-away sheath was placed over a guidewire under fluoroscopic guidance. The catheter was then placed through the sheath and the sheath was removed. Final catheter positioning was confirmed and documented with a fluoroscopic spot radiograph. The port was accessed with a Huber needle, aspirated and flushed with heparinized saline. The venotomy site was closed with an interrupted 4-0 Vicryl suture. The port pocket incision was closed with interrupted 2-0 Vicryl suture. The skin was opposed with a running subcuticular 4-0 Vicryl suture. Dermabond and Steri-strips were applied to both incisions. Dressings were applied. The patient tolerated the procedure well without immediate post procedural  complication. FINDINGS: After catheter placement, the tip lies within the superior cavoatrial junction. The catheter aspirates and flushes normally and is ready for immediate use. IMPRESSION: Successful placement of a right internal jugular approach power injectable Port-A-Cath. The catheter is ready for immediate use. Electronically Signed   By: Simonne Come M.D.   On: 03/18/2023 15:00   NM PET Image Initial (PI) Skull Base To Thigh (F-18 FDG)  Result Date: 03/16/2023 CLINICAL  DATA:  Initial treatment strategy for head and neck cancer. EXAM: NUCLEAR MEDICINE PET SKULL BASE TO THIGH TECHNIQUE: 13.37 mCi F-18 FDG was injected intravenously. Full-ring PET imaging was performed from the skull base to thigh after the radiotracer. CT data was obtained and used for attenuation correction and anatomic localization. Fasting blood glucose: 114 mg/dl COMPARISON:  CT neck 03/47/4259. CT angio chest and CT AP from 03/24/22 FINDINGS: Mediastinal blood pool activity: SUV max 2.5 Liver activity: SUV max NA . NECK: -Tracer avid right base of tongue and pharyngeal mass is intensely FDG avid with SUV max of 14.34, image 143 of the fused PET-CT images. -Tracer uptake within the left lateral wall of the oropharynx has an SUV max of 5.67, image 45/202. Bilateral tracer avid cervical lymph nodes are identified. -Index left level 2 node measures 1.9 cm with SUV max of 12.17, image 47/202. -Conglomeration of tracer avid lymph nodes within the right level 3 region measuring approximately 2.9 x 2.2 cm has an SUV max of 8.80, image 47/202. -Index lymph node lateral to the right mandibular ramus measures 1.1 cm with SUV max of 7.21. -index left posterior level 3 node measures 0.8 cm with SUV max of 4.68, image 50/202. Incidental CT findings: None. CHEST: No tracer avid mediastinal, hilar, axillary or supraclavicular lymph nodes. Within the left midlung there is a nodule abutting the oblique fissure measuring 6 mm with SUV max of 1.38, image  32/223. No additional lung nodules. Incidental CT findings: Aortic atherosclerosis and coronary artery atherosclerotic calcifications. ABDOMEN/PELVIS: No abnormal tracer uptake identified within the liver, pancreas, spleen, or adrenal glands. No tracer avid abdominopelvic lymph nodes. Incidental CT findings: There is a umbilical hernia which contains nonobstructed loops of small bowel, image 147/2. Aortic atherosclerotic calcifications. SKELETON: No focal hypermetabolic activity to suggest skeletal metastasis. Incidental CT findings: None. IMPRESSION: 1. The right base of tongue and pharyngeal mass is intensely FDG avid compatible with primary head and neck cancer. 2. There is additional tracer uptake within the left lateral wall of the oropharynx. This is nonspecific and may be inflammatory or neoplastic in etiology. Correlation with direct visualization advised. 3. Bilateral tracer avid cervical lymph nodes are identified compatible with nodal metastasis. 4. There is a 6 mm nodule within the left midlung abutting the oblique fissure which demonstrates low level FDG uptake. Technically too small to reliably characterize by PET-CT. Attention on follow-up imaging advised. 5. Umbilical hernia contains nonobstructed loops of small bowel. 6. Coronary artery atherosclerotic calcifications. 7.  Aortic Atherosclerosis (ICD10-I70.0). Electronically Signed   By: Signa Kell M.D.   On: 03/16/2023 09:12

## 2023-03-30 NOTE — Progress Notes (Signed)
[facility] CSW Progress Note  Visual merchandiser  spoke with pt and his room mate by phone regarding bed bugs.    Pt confirmed he believes they have had bed bugs for approximately 6 months and they have gotten worse since the weather has begun to get cold.  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  Gregory Fisher verbalized agreement w/ CSW reaching out to [employer] to assess the home and determine if the bed bugs can be exterminate.  Pt has 9 dogs.  CSW to follow up w/ [employer] and will reconnect with pt once the home is assessed.      Rachel Moulds, LCSW Clinical Social Worker [facility]

## 2023-03-30 NOTE — Progress Notes (Signed)
Nutrition Follow-up:  Patient with oropharyngeal cancer. He is receiving concurrent chemoradiation with weekly carboplatin + paclitaxel  (start 10/14). Patient is under the care of Dr. Anders Simmonds.   PEG - 10/9  Met with patient in infusion. He is attempting to change socks at visit. RD assisted. Observed blackened toenails as well as purple color to skin on shin. 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 MD made aware. Patient reports he has been flushing his tube. He says he did this 2x this morning. Patient endorses sore throat and pain with swallowing. His appetite is decreased. States he has not been eating much at all. Unable to provide recall. He is requesting bag of food today. Patient is agreeable to completing bolus feeding. Patient tolerated one carton Osmolite 1.5 with 60 ml water flush before and 120 ml water flush after. RD inquired about daily PEG care. Patient states he is changing his bandage. Skin is excoriated around tube insertion site. RD removed split guaze. Puss on guaze and small amount of thick milky secretions along bottom of insertion site. Patient denies nausea, vomiting, diarrhea. He endorses constipation and asking for stool softener. Patient states "its been days, maybe weeks since I've had a bowel movement." MD made aware.    Medications: Senna, doxycycline (10/21)  Labs: Na 131, glucose 122, albumin 3.1  Anthropometrics: Wt 254 lb 9.6 oz today decreased (? Wt history)  10/14 - 276 lb 9/26 - 256 lb 8/21 - 281 lb 1.4 oz    Estimated Energy Needs  Kcals: 2500-2800 Protein: 135-155 Fluid: >/= 2.5 L  NUTRITION DIAGNOSIS: Inadequate oral intake continues - initiating bolus feedings to meets nutrition/hydration needs   INTERVENTION:  Reinforced PEG care education, how to clean around bumper, importance of keeping areOne bag of food from [facility] pantry provideda clean and dry - findings discussed with MD (antibiotic  called in to pt pharmacy) Provided bolus education - pt able to complete feeding and water flush without difficulty -provided samples of Osmolite 1.5 + syringes Will contact [employer] for enteral formula and supplies - anticipate long term enteral needs   Isosource 1.5 - 7 cartons (1750 ml/day) split over 4 feedings/day. Flush tube with 60 ml water before and 120 ml water after. Drink by mouth or give via tube additional 237 ml water BID. This provides 2625 kcal, 119 g protein, 1337 ml water from formula (2531 ml total water with flushes) Meets 100% RDI   MONITORING, EVALUATION, GOAL: weight trends, intake, TF   NEXT VISIT: Monday October 28 during infusion

## 2023-03-30 NOTE — Progress Notes (Signed)
No blood return noted from port. Labs drawn peripherally. Alteplase administered at 1030. Blood return noted at 1100.   Pt.'s abdomen distended and taut. Peg tube is present with a purulent drainage leaking around peg tube. MD and dietitian aware- no new orders given at this time. Pt expresses to RN that he needs a stool softener, MD aware. RN made patient aware of the need to pick up all his prescriptions at his pharmacy, pt verbalized understanding. Patient tolerated chemotherapy with no complaints voiced.  Side effects with management reviewed with understanding verbalized.  Port site clean and dry with no bruising or swelling noted at site.  Good blood return noted before and after administration of chemotherapy.  Band aid applied.  Patient left in satisfactory condition with VSS and no s/s of distress noted. Pt escorted out via wheelchair in stable condition.  Dekota Shenk [employer]

## 2023-03-30 NOTE — Assessment & Plan Note (Signed)
-  Patient stated symptoms of depression on last visit -Does not have active suicidal intentions but feels depressed and does not want to do a lot -Patient feeling better on this visit but is overwhelmed with everything going on

## 2023-03-30 NOTE — Patient Instructions (Signed)
[facility]  Discharge Instructions: Thank you for choosing [facility] to provide your oncology and hematology care.  If you have a lab appointment with the Cancer Center - please note that after April 8th, 2024, all labs will be drawn in the cancer center.  You do not have to check in or register with the main entrance as you have in the past but will complete your check-in in the cancer center.  Wear comfortable clothing and clothing appropriate for easy access to any Portacath or PICC line.   We strive to give you quality time with your provider. You may need to reschedule your appointment if you arrive late (15 or more minutes).  Arriving late affects you and other patients whose appointments are after yours.  Also, if you miss three or more appointments without notifying the office, you may be dismissed from the clinic at the provider's discretion.      For prescription refill requests, have your pharmacy contact our office and allow 72 hours for refills to be completed.    Today you received the following chemotherapy and/or immunotherapy agents Taxol/Carboplatin      To help prevent nausea and vomiting after your treatment, we encourage you to take your nausea medication as directed.  BELOW ARE SYMPTOMS THAT SHOULD BE REPORTED IMMEDIATELY: *FEVER GREATER THAN 100.4 F (38 C) OR HIGHER *CHILLS OR SWEATING *NAUSEA AND VOMITING THAT IS NOT CONTROLLED WITH YOUR NAUSEA MEDICATION *UNUSUAL SHORTNESS OF BREATH *UNUSUAL BRUISING OR BLEEDING *URINARY PROBLEMS (pain or burning when urinating, or frequent urination) *BOWEL PROBLEMS (unusual diarrhea, constipation, pain near the anus) TENDERNESS IN MOUTH AND THROAT WITH OR WITHOUT PRESENCE OF ULCERS (sore throat, sores in mouth, or a toothache) UNUSUAL RASH, SWELLING OR PAIN  UNUSUAL VAGINAL DISCHARGE OR ITCHING   Items with * indicate a potential emergency and should be followed up as soon as possible or go  to the Emergency Department if any problems should occur.  Please show the CHEMOTHERAPY ALERT CARD or IMMUNOTHERAPY ALERT CARD at check-in to the Emergency Department and triage nurse.  Should you have questions after your visit or need to cancel or reschedule your appointment, please contact [facility]ENTER AT [facility] [phone]  and follow the prompts.  Office hours are 8:00 a.m. to 4:30 p.m. Monday - Friday. Please note that voicemails left after 4:00 p.m. may not be returned until the following business day.  We are closed weekends and major holidays. You have access to a nurse at all times for urgent questions. Please call the main number to the clinic [phone] and follow the prompts.  For any non-urgent questions, you may also contact your provider using MyChart. We now offer e-Visits for anyone 36 and older to request care online for non-urgent symptoms. For details visit [email or web address].   Also download the MyChart app! Go to the app store, search "MyChart", open the app, select [facility], and log in with your MyChart username and password.

## 2023-03-30 NOTE — Assessment & Plan Note (Signed)
Patient reported some constipation even though he had a bowel movement while in the clinic -Will send a prescription for stool softeners

## 2023-03-30 NOTE — Assessment & Plan Note (Signed)
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 Baseline labs stable. Good for treatment today.   RTC in 2 weeks

## 2023-03-31 DIAGNOSIS — C770 Secondary and unspecified malignant neoplasm of lymph nodes of head, face and neck: Secondary | ICD-10-CM | POA: Diagnosis not present

## 2023-03-31 DIAGNOSIS — C108 Malignant neoplasm of overlapping sites of oropharynx: Secondary | ICD-10-CM | POA: Diagnosis not present

## 2023-03-31 DIAGNOSIS — J359 Chronic disease of tonsils and adenoids, unspecified: Secondary | ICD-10-CM | POA: Diagnosis not present

## 2023-04-01 DIAGNOSIS — C108 Malignant neoplasm of overlapping sites of oropharynx: Secondary | ICD-10-CM | POA: Diagnosis not present

## 2023-04-01 DIAGNOSIS — J359 Chronic disease of tonsils and adenoids, unspecified: Secondary | ICD-10-CM | POA: Diagnosis not present

## 2023-04-01 DIAGNOSIS — C770 Secondary and unspecified malignant neoplasm of lymph nodes of head, face and neck: Secondary | ICD-10-CM | POA: Diagnosis not present

## 2023-04-02 ENCOUNTER — Other Ambulatory Visit: Payer: Self-pay

## 2023-04-02 DIAGNOSIS — J359 Chronic disease of tonsils and adenoids, unspecified: Secondary | ICD-10-CM | POA: Diagnosis not present

## 2023-04-02 DIAGNOSIS — C770 Secondary and unspecified malignant neoplasm of lymph nodes of head, face and neck: Secondary | ICD-10-CM | POA: Diagnosis not present

## 2023-04-02 DIAGNOSIS — C108 Malignant neoplasm of overlapping sites of oropharynx: Secondary | ICD-10-CM | POA: Diagnosis not present

## 2023-04-02 NOTE — Patient Instructions (Signed)
Thank you for allowing the Care Management team to participate in your care.  It was great speaking with you today!

## 2023-04-02 NOTE — Patient Outreach (Signed)
Care Management   Visit Note  04/02/2023 Name: Gregory Fisher MRN: [id number] DOB: 1950/11/21  Subjective: Gregory Fisher is a 72 y.o. year old male who is a primary care patient of Eustaquio Boyden, MD. The Care Management team was consulted for assistance.      Engaged with patient spoke with patient by telephone.   Assessment:  Outpatient Encounter Medications as of 04/02/2023  Medication Sig Note   cloNIDine (CATAPRES) 0.1 MG tablet TAKE 1 TABLET BY MOUTH TWICE  DAILY    dapagliflozin propanediol (FARXIGA) 5 MG TABS tablet Take 1 tablet (5 mg total) by mouth daily before breakfast.    furosemide (LASIX) 40 MG tablet TAKE 1 TABLET BY MOUTH EVERY DAY    KLOR-CON M10 10 MEQ tablet TAKE 1 TABLET BY MOUTH TWICE A DAY    LORazepam (ATIVAN) 1 MG tablet TAKE 1 TABLET BY MOUTH EVERY DAY AS NEEDED FOR ANXIETY    metFORMIN (GLUCOPHAGE) 500 MG tablet TAKE 1 TABLET BY MOUTH TWICE  DAILY WITH MEALS    senna (SENOKOT) 8.6 MG TABS tablet Take 1 tablet (8.6 mg total) by mouth at bedtime.    tamsulosin (FLOMAX) 0.4 MG CAPS capsule TAKE 2 CAPSULES BY MOUTH AT  BEDTIME    valsartan (DIOVAN) 320 MG tablet Take 0.5 tablets (160 mg total) by mouth daily. 04/02/2023: Reports needed refill.   Accu-Chek Softclix Lancets lancets Use as instructed to check blood sugar 2 (times) a day    atorvastatin (LIPITOR) 80 MG tablet TAKE 1 TABLET BY MOUTH DAILY    Blood Glucose Monitoring Suppl (ACCU-CHEK GUIDE ME) w/Device KIT Use as instructed to check blood sugar 2 (times) a day    CARBOPLATIN IV Inject into the vein once a week.    carvedilol (COREG) 12.5 MG tablet TAKE 1 TABLET BY MOUTH TWICE  DAILY WITH MEALS    Cholecalciferol (VITAMIN D3) 25 MCG (1000 UT) CAPS Take 2 capsules (2,000 Units total) by mouth daily. (Patient not taking: Reported on 04/02/2023)    dexamethasone (DECADRON) 4 MG tablet Take 2 tablets daily for 2 days, start the day after chemotherapy. Take with food.    doxycycline (VIBRA-TABS) 100  MG tablet Take 1 tablet (100 mg total) by mouth 2 (two) times daily for 10 days.    glucose blood (ACCU-CHEK GUIDE) test strip Use as instructed to check blood sugar 2 (times) a day    lidocaine-prilocaine (EMLA) cream Apply a quarter-sized amount to port a cath site and cover with plastic wrap 1 hour prior to infusion appointments (Patient not taking: Reported on 04/02/2023)    ondansetron (ZOFRAN) 8 MG tablet Take 1 tablet (8 mg total) by mouth every 8 (eight) hours as needed for nausea or vomiting. Start on the third day after chemotherapy. (Patient not taking: Reported on 04/02/2023)    PACLitaxel (TAXOL IV) Inject into the vein once a week.    sodium chloride (OCEAN) 0.65 % SOLN nasal spray Place 1 spray into both nostrils as needed. (Patient not taking: Reported on 04/02/2023)    No facility-administered encounter medications on file as of 04/02/2023.

## 2023-04-03 DIAGNOSIS — C108 Malignant neoplasm of overlapping sites of oropharynx: Secondary | ICD-10-CM | POA: Diagnosis not present

## 2023-04-03 DIAGNOSIS — J359 Chronic disease of tonsils and adenoids, unspecified: Secondary | ICD-10-CM | POA: Diagnosis not present

## 2023-04-03 DIAGNOSIS — C770 Secondary and unspecified malignant neoplasm of lymph nodes of head, face and neck: Secondary | ICD-10-CM | POA: Diagnosis not present

## 2023-04-06 ENCOUNTER — Emergency Department (HOSPITAL_COMMUNITY): Payer: Medicare HMO

## 2023-04-06 ENCOUNTER — Inpatient Hospital Stay: Payer: Medicare HMO | Admitting: Dietician

## 2023-04-06 ENCOUNTER — Inpatient Hospital Stay: Payer: Medicare HMO | Admitting: Oncology

## 2023-04-06 ENCOUNTER — Inpatient Hospital Stay: Payer: Medicare HMO

## 2023-04-06 ENCOUNTER — Other Ambulatory Visit: Payer: Self-pay

## 2023-04-06 ENCOUNTER — Encounter (HOSPITAL_COMMUNITY): Payer: Self-pay

## 2023-04-06 ENCOUNTER — Emergency Department (HOSPITAL_COMMUNITY)
Admission: EM | Admit: 2023-04-06 | Discharge: 2023-04-06 | Discharge status: Against Medical Advice | Payer: Medicare HMO | Attending: Emergency Medicine | Admitting: Emergency Medicine

## 2023-04-06 VITALS — BP 121/67 | HR 60 | Temp 97.6°F | Resp 19 | Wt 247.2 lb

## 2023-04-06 VITALS — BP 148/80 | HR 63 | Temp 98.3°F | Resp 18

## 2023-04-06 DIAGNOSIS — C770 Secondary and unspecified malignant neoplasm of lymph nodes of head, face and neck: Secondary | ICD-10-CM | POA: Diagnosis not present

## 2023-04-06 DIAGNOSIS — C109 Malignant neoplasm of oropharynx, unspecified: Secondary | ICD-10-CM

## 2023-04-06 DIAGNOSIS — Z85828 Personal history of other malignant neoplasm of skin: Secondary | ICD-10-CM | POA: Diagnosis not present

## 2023-04-06 DIAGNOSIS — C108 Malignant neoplasm of overlapping sites of oropharynx: Secondary | ICD-10-CM | POA: Diagnosis not present

## 2023-04-06 DIAGNOSIS — R059 Cough, unspecified: Secondary | ICD-10-CM | POA: Diagnosis not present

## 2023-04-06 DIAGNOSIS — R0989 Other specified symptoms and signs involving the circulatory and respiratory systems: Secondary | ICD-10-CM | POA: Diagnosis not present

## 2023-04-06 DIAGNOSIS — J029 Acute pharyngitis, unspecified: Secondary | ICD-10-CM | POA: Diagnosis not present

## 2023-04-06 DIAGNOSIS — Z5321 Procedure and treatment not carried out due to patient leaving prior to being seen by health care provider: Secondary | ICD-10-CM | POA: Diagnosis not present

## 2023-04-06 DIAGNOSIS — Z95828 Presence of other vascular implants and grafts: Secondary | ICD-10-CM

## 2023-04-06 DIAGNOSIS — J359 Chronic disease of tonsils and adenoids, unspecified: Secondary | ICD-10-CM | POA: Diagnosis not present

## 2023-04-06 LAB — CBC WITH DIFFERENTIAL/PLATELET
Abs Immature Granulocytes: 0.02 10*3/uL (ref 0.00–0.07)
Basophils Absolute: 0 10*3/uL (ref 0.0–0.1)
Basophils Relative: 0 %
Eosinophils Absolute: 0 10*3/uL (ref 0.0–0.5)
Eosinophils Relative: 0 %
HCT: 36.8 % — ABNORMAL LOW (ref 39.0–52.0)
Hemoglobin: 12.2 g/dL — ABNORMAL LOW (ref 13.0–17.0)
Immature Granulocytes: 1 %
Lymphocytes Relative: 4 %
Lymphs Abs: 0.2 10*3/uL — ABNORMAL LOW (ref 0.7–4.0)
MCH: 29.5 pg (ref 26.0–34.0)
MCHC: 33.2 g/dL (ref 30.0–36.0)
MCV: 89.1 fL (ref 80.0–100.0)
Monocytes Absolute: 0.3 10*3/uL (ref 0.1–1.0)
Monocytes Relative: 6 %
Neutro Abs: 3.9 10*3/uL (ref 1.7–7.7)
Neutrophils Relative %: 89 %
Platelets: 258 10*3/uL (ref 150–400)
RBC: 4.13 MIL/uL — ABNORMAL LOW (ref 4.22–5.81)
RDW: 13 % (ref 11.5–15.5)
WBC: 4.4 10*3/uL (ref 4.0–10.5)
nRBC: 0 % (ref 0.0–0.2)

## 2023-04-06 LAB — COMPREHENSIVE METABOLIC PANEL
ALT: 19 U/L (ref 0–44)
AST: 20 U/L (ref 15–41)
Albumin: 3.2 g/dL — ABNORMAL LOW (ref 3.5–5.0)
Alkaline Phosphatase: 67 U/L (ref 38–126)
Anion gap: 11 (ref 5–15)
BUN: 20 mg/dL (ref 8–23)
CO2: 26 mmol/L (ref 22–32)
Calcium: 8.9 mg/dL (ref 8.9–10.3)
Chloride: 96 mmol/L — ABNORMAL LOW (ref 98–111)
Creatinine, Ser: 0.86 mg/dL (ref 0.61–1.24)
GFR, Estimated: >60 mL/min (ref >60)
Glucose, Bld: 134 mg/dL — ABNORMAL HIGH (ref 70–99)
Potassium: 4 mmol/L (ref 3.5–5.1)
Sodium: 133 mmol/L — ABNORMAL LOW (ref 135–145)
Total Bilirubin: 0.6 mg/dL (ref 0.3–1.2)
Total Protein: 7.1 g/dL (ref 6.5–8.1)

## 2023-04-06 LAB — MAGNESIUM: Magnesium: 2.2 mg/dL (ref 1.7–2.4)

## 2023-04-06 MED ORDER — SODIUM CHLORIDE 0.9 % IV SOLN: Freq: Once | INTRAVENOUS | Status: AC

## 2023-04-06 MED ORDER — HEPARIN SOD (PORK) LOCK FLUSH 100 UNIT/ML IV SOLN
500.0000 [IU] | Freq: Once | INTRAVENOUS | Status: AC | PRN
  Administered 2023-04-06: 500 [IU]

## 2023-04-06 MED ORDER — SUCRALFATE 1 GM/10ML PO SUSP: 1.0000 g | Freq: Three times a day (TID) | ORAL | 0 refills | Status: DC

## 2023-04-06 MED ORDER — SODIUM CHLORIDE 0.9% FLUSH
10.0000 mL | INTRAVENOUS | Status: DC | PRN
  Administered 2023-04-06: 10 mL

## 2023-04-06 MED ORDER — SODIUM CHLORIDE 0.9% FLUSH
10.0000 mL | INTRAVENOUS | Status: DC | PRN
  Administered 2023-04-06: 10 mL via INTRAVENOUS

## 2023-04-06 MED ORDER — SODIUM CHLORIDE 0.9 % IV SOLN
45.0000 mg/m2 | Freq: Once | INTRAVENOUS | Status: AC
  Administered 2023-04-06: 108 mg via INTRAVENOUS
  Filled 2023-04-06: qty 18

## 2023-04-06 MED ORDER — PALONOSETRON HCL INJECTION 0.25 MG/5ML
0.2500 mg | Freq: Once | INTRAVENOUS | Status: AC
  Administered 2023-04-06: 0.25 mg via INTRAVENOUS
  Filled 2023-04-06: qty 5

## 2023-04-06 MED ORDER — FAMOTIDINE IN NACL 20-0.9 MG/50ML-% IV SOLN
20.0000 mg | Freq: Once | INTRAVENOUS | Status: AC
  Administered 2023-04-06: 20 mg via INTRAVENOUS
  Filled 2023-04-06: qty 50

## 2023-04-06 MED ORDER — DEXAMETHASONE SODIUM PHOSPHATE 100 MG/10ML IJ SOLN: 10.0000 mg | Freq: Once | INTRAMUSCULAR | Status: DC

## 2023-04-06 MED ORDER — DIPHENHYDRAMINE HCL 50 MG/ML IJ SOLN
50.0000 mg | Freq: Once | INTRAMUSCULAR | Status: AC
  Administered 2023-04-06: 50 mg via INTRAVENOUS
  Filled 2023-04-06: qty 1

## 2023-04-06 MED ORDER — SODIUM CHLORIDE 0.9 % IV SOLN
294.4000 mg | Freq: Once | INTRAVENOUS | Status: AC
  Administered 2023-04-06: 290 mg via INTRAVENOUS
  Filled 2023-04-06: qty 29

## 2023-04-06 MED ORDER — DEXAMETHASONE SODIUM PHOSPHATE 10 MG/ML IJ SOLN
10.0000 mg | Freq: Once | INTRAMUSCULAR | Status: AC
  Administered 2023-04-06: 10 mg via INTRAVENOUS
  Filled 2023-04-06: qty 1

## 2023-04-06 NOTE — Progress Notes (Signed)
Nutrition Follow-up:  Patient with oropharyngeal cancer. He is receiving concurrent chemoradiation with weekly carboplatin + paclitaxel  (start 10/14). Patient is under the care of Dr. Anders Simmonds.   PEG - 10/9  RD working remotely  [employer] with patient via telephone during his infusion. Patient reports he is not feeling well today. States he has a cold and has sores on roof of mouth. Patient reports he needs something to take for this. Patient unsure if he has been taking antibiotics for infection at tube insertion site. He is asking if medication starts with the letter "D" - he thinks he took one of those this morning. Patient states he has a lot of medicines and doesn't know what they are for. Patient has poor appetite. His throat is sore. Recalls half can of spaghetti and meatballs yesterday. Patient has started using feeding tube. Giving 1- 2 cartons Osmolite over the last couple of days. He is flushing tube with water. He is unable to recall amount of FWF. Patient unable to confirm if he is providing daily PEG care. Patient continues to feel overwhelmed and stressed. Says he is unable to remember everything and has too much to do.   Secure chat with infusion RN Roswell Nickel). PEG care provided bedside. RN reports skin around tube is red with areas of skin breakdown. RN reinforced education. Given congestion, patient sent to ED for CXR and further evaluation. Per notes, patient left prior to medical discharge in order to make radiation appointment.   Medications: reviewed   Labs: Na 133, glucose 134, albumin 3.2  Anthropometrics: Wt 247 lb 3.2 oz today - increased   10/21 - 254 lb 9.6 oz 10/9 - 256 lb  9/26 - 256 lb    Estimated Energy Needs   Kcals: 2500-2800 Protein: 135-155 Fluid: >/= 2.5 L   NUTRITION DIAGNOSIS: Inadequate oral intake continues - initiating bolus feedings to meets nutrition/hydration needs     INTERVENTION:  PEG care education reinforced by nursing, reviewed  instructions for antibiotic and stressed importance of taking as prescribed  Encouraged oral intake of soft moist foods as tolerated One bag of food from [facility] pantry provided Encouraged increasing bolus feedings given increasing throat pain and poor po - sample case of Osmolite provided LCSW following   Goal: Anticipate long term enteral needs Isosource 1.5 - 7 cartons (1750 ml/day) split over 4 feedings/day. Flush tube with 60 ml water before and 120 ml water after. Drink by mouth or give via tube additional 237 ml water BID. This provides 2625 kcal, 119 g protein, 1337 ml water from formula (2531 ml total water with flushes) Meets 100% RDI    MONITORING, EVALUATION, GOAL: wt trends, oral intake, TF   NEXT VISIT: Monday November 2 during infusion

## 2023-04-06 NOTE — Progress Notes (Signed)
Patients port flushed without difficulty.  Good blood return noted with no bruising or swelling noted at site.  Patient remains accessed for treatment.  

## 2023-04-06 NOTE — Progress Notes (Addendum)
Patient presents today for Taxol/Carboplatin infusion per providers order.  Vital signs WNL  Labs pending.  Patient c/o mouth ulcers due to chemotherapy and radiation.  Carafate sent to patients pharmacy.    Labs within parameters for treatment.  Treatment given today per MD orders.  Stable duirng infusion without adverse affects.  Vital signs stable.  No complaints at this time.  Discharge from clinic ambulatory in stable condition.  Per Dr. Anders Simmonds patient to go to the ED and be evaluated for pneumonia.  Patient expresses understanding. Follow up with [facility] as scheduled.

## 2023-04-06 NOTE — ED Triage Notes (Addendum)
C/o productive cough, sore throat, and runny nose x1 week Pt reports ulcers related to chemo.  Hx squamous cell.  Delsym w/o relief.

## 2023-04-06 NOTE — Addendum Note (Signed)
Addended by: Lala Lund on: 04/06/2023 09:47 AM   Modules accepted: Orders

## 2023-04-06 NOTE — Progress Notes (Signed)
Pharmacist Chemotherapy Monitoring - Initial Assessment    Anticipated start date: 10/28/*2024   The following has been reviewed per standard work regarding the patient's treatment regimen: The patient's diagnosis, treatment plan and drug doses, and organ/hematologic function Lab orders and baseline tests specific to treatment regimen  The treatment plan start date, drug sequencing, and pre-medications Prior authorization status  Patient's documented medication list, including drug-drug interaction screen and prescriptions for anti-emetics and supportive care specific to the treatment regimen The drug concentrations, fluid compatibility, administration routes, and timing of the medications to be used The patient's access for treatment and lifetime cumulative dose history, if applicable  The patient's medication allergies and previous infusion related reactions, if applicable   Changes made to treatment plan:  N/A  Follow up needed:  N/A   Stephens Shire, [facility], 04/06/2023  9:06 AM

## 2023-04-06 NOTE — Addendum Note (Signed)
Addended by: Pryor Ochoa E on: 04/06/2023 09:09 AM   Modules accepted: Orders

## 2023-04-06 NOTE — ED Notes (Signed)
Patient reports that he has to go to radiation and can't wait no longer. Advised patient to stay

## 2023-04-06 NOTE — Patient Instructions (Signed)
[facility]  Discharge Instructions: Thank you for choosing [facility] to provide your oncology and hematology care.  If you have a lab appointment with the Cancer Center - please note that after April 8th, 2024, all labs will be drawn in the cancer center.  You do not have to check in or register with the main entrance as you have in the past but will complete your check-in in the cancer center.  Wear comfortable clothing and clothing appropriate for easy access to any Portacath or PICC line.   We strive to give you quality time with your provider. You may need to reschedule your appointment if you arrive late (15 or more minutes).  Arriving late affects you and other patients whose appointments are after yours.  Also, if you miss three or more appointments without notifying the office, you may be dismissed from the clinic at the provider's discretion.      For prescription refill requests, have your pharmacy contact our office and allow 72 hours for refills to be completed.    Today you received the following chemotherapy and/or immunotherapy agents Taxol/Carboplatin      To help prevent nausea and vomiting after your treatment, we encourage you to take your nausea medication as directed.  BELOW ARE SYMPTOMS THAT SHOULD BE REPORTED IMMEDIATELY: *FEVER GREATER THAN 100.4 F (38 C) OR HIGHER *CHILLS OR SWEATING *NAUSEA AND VOMITING THAT IS NOT CONTROLLED WITH YOUR NAUSEA MEDICATION *UNUSUAL SHORTNESS OF BREATH *UNUSUAL BRUISING OR BLEEDING *URINARY PROBLEMS (pain or burning when urinating, or frequent urination) *BOWEL PROBLEMS (unusual diarrhea, constipation, pain near the anus) TENDERNESS IN MOUTH AND THROAT WITH OR WITHOUT PRESENCE OF ULCERS (sore throat, sores in mouth, or a toothache) UNUSUAL RASH, SWELLING OR PAIN  UNUSUAL VAGINAL DISCHARGE OR ITCHING   Items with * indicate a potential emergency and should be followed up as soon as possible or go  to the Emergency Department if any problems should occur.  Please show the CHEMOTHERAPY ALERT CARD or IMMUNOTHERAPY ALERT CARD at check-in to the Emergency Department and triage nurse.  Should you have questions after your visit or need to cancel or reschedule your appointment, please contact [facility]ENTER AT [facility] [phone]  and follow the prompts.  Office hours are 8:00 a.m. to 4:30 p.m. Monday - Friday. Please note that voicemails left after 4:00 p.m. may not be returned until the following business day.  We are closed weekends and major holidays. You have access to a nurse at all times for urgent questions. Please call the main number to the clinic [phone] and follow the prompts.  For any non-urgent questions, you may also contact your provider using MyChart. We now offer e-Visits for anyone 36 and older to request care online for non-urgent symptoms. For details visit [email or web address].   Also download the MyChart app! Go to the app store, search "MyChart", open the app, select [facility], and log in with your MyChart username and password.

## 2023-04-07 ENCOUNTER — Telehealth: Payer: Self-pay | Admitting: Family Medicine

## 2023-04-07 DIAGNOSIS — I10 Essential (primary) hypertension: Secondary | ICD-10-CM

## 2023-04-07 DIAGNOSIS — N400 Enlarged prostate without lower urinary tract symptoms: Secondary | ICD-10-CM

## 2023-04-07 DIAGNOSIS — E1169 Type 2 diabetes mellitus with other specified complication: Secondary | ICD-10-CM

## 2023-04-07 DIAGNOSIS — C770 Secondary and unspecified malignant neoplasm of lymph nodes of head, face and neck: Secondary | ICD-10-CM | POA: Diagnosis not present

## 2023-04-07 DIAGNOSIS — C108 Malignant neoplasm of overlapping sites of oropharynx: Secondary | ICD-10-CM | POA: Diagnosis not present

## 2023-04-07 DIAGNOSIS — J359 Chronic disease of tonsils and adenoids, unspecified: Secondary | ICD-10-CM | POA: Diagnosis not present

## 2023-04-07 MED ORDER — CLONIDINE HCL 0.1 MG PO TABS: 0.1000 mg | ORAL_TABLET | Freq: Two times a day (BID) | ORAL | 0 refills | Status: DC

## 2023-04-07 MED ORDER — ATORVASTATIN CALCIUM 80 MG PO TABS: 80.0000 mg | ORAL_TABLET | Freq: Every day | ORAL | 0 refills | Status: DC

## 2023-04-07 MED ORDER — FUROSEMIDE 40 MG PO TABS: 40.0000 mg | ORAL_TABLET | Freq: Every day | ORAL | 0 refills | Status: DC

## 2023-04-07 MED ORDER — CARVEDILOL 12.5 MG PO TABS: 12.5000 mg | ORAL_TABLET | Freq: Two times a day (BID) | ORAL | 0 refills | Status: DC

## 2023-04-07 MED ORDER — METFORMIN HCL 500 MG PO TABS: 500.0000 mg | ORAL_TABLET | Freq: Two times a day (BID) | ORAL | 0 refills | Status: DC

## 2023-04-07 MED ORDER — TAMSULOSIN HCL 0.4 MG PO CAPS: 0.8000 mg | ORAL_CAPSULE | Freq: Every day | ORAL | 0 refills | Status: DC

## 2023-04-07 MED ORDER — POTASSIUM CHLORIDE CRYS ER 10 MEQ PO TBCR: 10.0000 meq | EXTENDED_RELEASE_TABLET | Freq: Two times a day (BID) | ORAL | 0 refills | Status: DC

## 2023-04-07 MED ORDER — VALSARTAN 320 MG PO TABS: 160.0000 mg | ORAL_TABLET | Freq: Every day | ORAL | 0 refills | Status: DC

## 2023-04-07 NOTE — Telephone Encounter (Signed)
Spoke to pt, scheduled cpe for 07/03/23

## 2023-04-07 NOTE — Telephone Encounter (Signed)
Patient called in and stated that he is needing all his medications to go over to [facility] [place] [place] - [address]  now since he switched to [facility]. He stated that he is currently needing valsartan (DIOVAN) 320 MG tablet to be refilled. Thank you!

## 2023-04-07 NOTE — Telephone Encounter (Signed)
Patient called in again wanting to know if  valsartan (DIOVAN) 320 MG tablet could be sent in to [id number] [place],  - [address] in the meantime. He would like his whole medication list to be sent to [facility].

## 2023-04-07 NOTE — Telephone Encounter (Signed)
E-scribed refills.  Plz schedule CPE and fasting labs (no food/drink- except water and/or blk coffee 5 hrs prior) visits for additional refills.

## 2023-04-08 DIAGNOSIS — C108 Malignant neoplasm of overlapping sites of oropharynx: Secondary | ICD-10-CM | POA: Diagnosis not present

## 2023-04-08 DIAGNOSIS — C770 Secondary and unspecified malignant neoplasm of lymph nodes of head, face and neck: Secondary | ICD-10-CM | POA: Diagnosis not present

## 2023-04-08 DIAGNOSIS — J359 Chronic disease of tonsils and adenoids, unspecified: Secondary | ICD-10-CM | POA: Diagnosis not present

## 2023-04-09 DIAGNOSIS — J359 Chronic disease of tonsils and adenoids, unspecified: Secondary | ICD-10-CM | POA: Diagnosis not present

## 2023-04-09 DIAGNOSIS — C770 Secondary and unspecified malignant neoplasm of lymph nodes of head, face and neck: Secondary | ICD-10-CM | POA: Diagnosis not present

## 2023-04-09 DIAGNOSIS — C108 Malignant neoplasm of overlapping sites of oropharynx: Secondary | ICD-10-CM | POA: Diagnosis not present

## 2023-04-10 DIAGNOSIS — C108 Malignant neoplasm of overlapping sites of oropharynx: Secondary | ICD-10-CM | POA: Diagnosis not present

## 2023-04-10 DIAGNOSIS — C770 Secondary and unspecified malignant neoplasm of lymph nodes of head, face and neck: Secondary | ICD-10-CM | POA: Diagnosis not present

## 2023-04-10 DIAGNOSIS — L539 Erythematous condition, unspecified: Secondary | ICD-10-CM | POA: Diagnosis not present

## 2023-04-10 DIAGNOSIS — Z931 Gastrostomy status: Secondary | ICD-10-CM | POA: Diagnosis not present

## 2023-04-10 DIAGNOSIS — Z510 Encounter for antineoplastic radiation therapy: Secondary | ICD-10-CM | POA: Diagnosis not present

## 2023-04-10 DIAGNOSIS — Z8616 Personal history of COVID-19: Secondary | ICD-10-CM | POA: Diagnosis not present

## 2023-04-10 DIAGNOSIS — K1230 Oral mucositis (ulcerative), unspecified: Secondary | ICD-10-CM | POA: Diagnosis not present

## 2023-04-10 DIAGNOSIS — Z79899 Other long term (current) drug therapy: Secondary | ICD-10-CM | POA: Diagnosis not present

## 2023-04-13 ENCOUNTER — Inpatient Hospital Stay: Payer: Medicare HMO

## 2023-04-13 ENCOUNTER — Emergency Department (HOSPITAL_COMMUNITY): Payer: Medicare HMO

## 2023-04-13 ENCOUNTER — Inpatient Hospital Stay: Payer: Medicare HMO | Attending: Oncology | Admitting: Oncology

## 2023-04-13 ENCOUNTER — Other Ambulatory Visit: Payer: Self-pay

## 2023-04-13 ENCOUNTER — Inpatient Hospital Stay: Payer: Medicare HMO | Admitting: Dietician

## 2023-04-13 ENCOUNTER — Emergency Department (HOSPITAL_COMMUNITY)
Admission: EM | Admit: 2023-04-13 | Discharge: 2023-04-13 | Disposition: A | Discharge status: Home / Self Care | Payer: Medicare HMO | Attending: Emergency Medicine | Admitting: Emergency Medicine

## 2023-04-13 ENCOUNTER — Encounter: Payer: Self-pay | Admitting: Oncology

## 2023-04-13 VITALS — BP 106/58 | HR 65 | Temp 99.0°F | Resp 20 | Wt 239.6 lb

## 2023-04-13 DIAGNOSIS — T85848A Pain due to other internal prosthetic devices, implants and grafts, initial encounter: Secondary | ICD-10-CM | POA: Diagnosis not present

## 2023-04-13 DIAGNOSIS — J189 Pneumonia, unspecified organism: Secondary | ICD-10-CM | POA: Insufficient documentation

## 2023-04-13 DIAGNOSIS — R1310 Dysphagia, unspecified: Secondary | ICD-10-CM | POA: Insufficient documentation

## 2023-04-13 DIAGNOSIS — U099 Post covid-19 condition, unspecified: Secondary | ICD-10-CM | POA: Insufficient documentation

## 2023-04-13 DIAGNOSIS — Z5919 Other inadequate housing: Secondary | ICD-10-CM | POA: Insufficient documentation

## 2023-04-13 DIAGNOSIS — C109 Malignant neoplasm of oropharynx, unspecified: Secondary | ICD-10-CM | POA: Diagnosis not present

## 2023-04-13 DIAGNOSIS — Z5111 Encounter for antineoplastic chemotherapy: Secondary | ICD-10-CM | POA: Insufficient documentation

## 2023-04-13 DIAGNOSIS — Z85818 Personal history of malignant neoplasm of other sites of lip, oral cavity, and pharynx: Secondary | ICD-10-CM | POA: Diagnosis not present

## 2023-04-13 DIAGNOSIS — F424 Excoriation (skin-picking) disorder: Secondary | ICD-10-CM | POA: Insufficient documentation

## 2023-04-13 DIAGNOSIS — R051 Acute cough: Secondary | ICD-10-CM

## 2023-04-13 DIAGNOSIS — R059 Cough, unspecified: Secondary | ICD-10-CM | POA: Diagnosis not present

## 2023-04-13 DIAGNOSIS — Z981 Arthrodesis status: Secondary | ICD-10-CM | POA: Diagnosis not present

## 2023-04-13 DIAGNOSIS — G893 Neoplasm related pain (acute) (chronic): Secondary | ICD-10-CM | POA: Insufficient documentation

## 2023-04-13 DIAGNOSIS — Z95828 Presence of other vascular implants and grafts: Secondary | ICD-10-CM

## 2023-04-13 DIAGNOSIS — J310 Chronic rhinitis: Secondary | ICD-10-CM | POA: Insufficient documentation

## 2023-04-13 DIAGNOSIS — F32A Depression, unspecified: Secondary | ICD-10-CM | POA: Insufficient documentation

## 2023-04-13 DIAGNOSIS — T148XXA Other injury of unspecified body region, initial encounter: Secondary | ICD-10-CM

## 2023-04-13 DIAGNOSIS — U071 COVID-19: Secondary | ICD-10-CM | POA: Insufficient documentation

## 2023-04-13 LAB — RESP PANEL BY RT-PCR (RSV, FLU A&B, COVID)  RVPGX2
Influenza A by PCR: NEGATIVE
Influenza B by PCR: NEGATIVE
Resp Syncytial Virus by PCR: NEGATIVE
SARS Coronavirus 2 by RT PCR: POSITIVE — AB

## 2023-04-13 LAB — COMPREHENSIVE METABOLIC PANEL
ALT: 28 U/L (ref 0–44)
ALT: 27 U/L (ref 0–44)
AST: 19 U/L (ref 15–41)
AST: 17 U/L (ref 15–41)
Albumin: 3.1 g/dL — ABNORMAL LOW (ref 3.5–5.0)
Albumin: 3.1 g/dL — ABNORMAL LOW (ref 3.5–5.0)
Alkaline Phosphatase: 68 U/L (ref 38–126)
Alkaline Phosphatase: 68 U/L (ref 38–126)
Anion gap: 7 (ref 5–15)
Anion gap: 7 (ref 5–15)
BUN: 30 mg/dL — ABNORMAL HIGH (ref 8–23)
BUN: 30 mg/dL — ABNORMAL HIGH (ref 8–23)
CO2: 25 mmol/L (ref 22–32)
CO2: 25 mmol/L (ref 22–32)
Calcium: 9.2 mg/dL (ref 8.9–10.3)
Calcium: 9.1 mg/dL (ref 8.9–10.3)
Chloride: 101 mmol/L (ref 98–111)
Chloride: 101 mmol/L (ref 98–111)
Creatinine, Ser: 0.96 mg/dL (ref 0.61–1.24)
Creatinine, Ser: 0.86 mg/dL (ref 0.61–1.24)
GFR, Estimated: >60 mL/min (ref >60)
GFR, Estimated: >60 mL/min (ref >60)
Glucose, Bld: 107 mg/dL — ABNORMAL HIGH (ref 70–99)
Glucose, Bld: 107 mg/dL — ABNORMAL HIGH (ref 70–99)
Potassium: 3.5 mmol/L (ref 3.5–5.1)
Potassium: 3.5 mmol/L (ref 3.5–5.1)
Sodium: 133 mmol/L — ABNORMAL LOW (ref 135–145)
Sodium: 133 mmol/L — ABNORMAL LOW (ref 135–145)
Total Bilirubin: 0.8 mg/dL (ref <1.2)
Total Bilirubin: 0.8 mg/dL (ref <1.2)
Total Protein: 6.6 g/dL (ref 6.5–8.1)
Total Protein: 6.5 g/dL (ref 6.5–8.1)

## 2023-04-13 LAB — CBC WITH DIFFERENTIAL/PLATELET
Abs Immature Granulocytes: 0.02 10*3/uL (ref 0.00–0.07)
Basophils Absolute: 0 10*3/uL (ref 0.0–0.1)
Basophils Relative: 1 %
Eosinophils Absolute: 0 10*3/uL (ref 0.0–0.5)
Eosinophils Relative: 1 %
HCT: 36.3 % — ABNORMAL LOW (ref 39.0–52.0)
Hemoglobin: 12.4 g/dL — ABNORMAL LOW (ref 13.0–17.0)
Immature Granulocytes: 1 %
Lymphocytes Relative: 8 %
Lymphs Abs: 0.3 10*3/uL — ABNORMAL LOW (ref 0.7–4.0)
MCH: 30.5 pg (ref 26.0–34.0)
MCHC: 34.2 g/dL (ref 30.0–36.0)
MCV: 89.2 fL (ref 80.0–100.0)
Monocytes Absolute: 0.5 10*3/uL (ref 0.1–1.0)
Monocytes Relative: 13 %
Neutro Abs: 3.1 10*3/uL (ref 1.7–7.7)
Neutrophils Relative %: 76 %
Platelets: 289 10*3/uL (ref 150–400)
RBC: 4.07 MIL/uL — ABNORMAL LOW (ref 4.22–5.81)
RDW: 13.5 % (ref 11.5–15.5)
WBC: 4 10*3/uL (ref 4.0–10.5)
nRBC: 0 % (ref 0.0–0.2)

## 2023-04-13 LAB — CBC
HCT: 35.9 % — ABNORMAL LOW (ref 39.0–52.0)
Hemoglobin: 12.2 g/dL — ABNORMAL LOW (ref 13.0–17.0)
MCH: 30.3 pg (ref 26.0–34.0)
MCHC: 34 g/dL (ref 30.0–36.0)
MCV: 89.1 fL (ref 80.0–100.0)
Platelets: 294 10*3/uL (ref 150–400)
RBC: 4.03 MIL/uL — ABNORMAL LOW (ref 4.22–5.81)
RDW: 13.4 % (ref 11.5–15.5)
WBC: 4.9 10*3/uL (ref 4.0–10.5)
nRBC: 0 % (ref 0.0–0.2)

## 2023-04-13 LAB — MAGNESIUM: Magnesium: 2 mg/dL (ref 1.7–2.4)

## 2023-04-13 MED ORDER — HEPARIN SOD (PORK) LOCK FLUSH 100 UNIT/ML IV SOLN
500.0000 [IU] | Freq: Once | INTRAVENOUS | Status: AC
  Administered 2023-04-13: 500 [IU]
  Filled 2023-04-13: qty 5

## 2023-04-13 MED ORDER — SODIUM CHLORIDE 0.9% FLUSH
10.0000 mL | Freq: Once | INTRAVENOUS | Status: AC
  Administered 2023-04-13: 10 mL via INTRAVENOUS

## 2023-04-13 MED ORDER — GUAIFENESIN-DM 100-10 MG/5ML PO SYRP
10.0000 mL | ORAL_SOLUTION | Freq: Once | ORAL | Status: AC
  Administered 2023-04-13: 10 mL via ORAL
  Filled 2023-04-13: qty 10

## 2023-04-13 NOTE — Discharge Instructions (Signed)
It was our pleasure to provide your ER care today - we hope that you feel better. Your covid test is positive. Your xray shows no pneumonia.   Drink plenty of fluids/stay well hydrated.  You may try myquil, mucinex, robitussin or similar over the counter cold and flu medication for symptom relief.  Stay active, take full and deep breaths.   Follow up with primary care doctor in two weeks if symptoms fail to improve/resolve.  Make sure to keep your skin around your g-tube site very clean and dry. You may change drain gauze/sponge frequently to keep area very dry.   Return to ER if worse, new symptoms, increased trouble breathing, or other emergency concern.

## 2023-04-13 NOTE — Progress Notes (Signed)
Patient Care Team: Eustaquio Boyden, MD as PCP - General (Family Medicine) Julio Sicks, MD as Consulting Physician (Neurosurgery) Jethro Bolus, MD (Inactive) as Consulting Physician (Urology) Kathyrn Sheriff, [facility] (Inactive) as Pharmacist (Pharmacist) [place], [place], [place] as Social Worker Cindie Crumbly, MD as Medical Oncologist (Medical Oncology) Therese Sarah, RN as Oncology Nurse Navigator (Medical Oncology) Juanell Fairly, RN as Case Manager  Clinic Day:  04/13/2023  Referring physician: Eustaquio Boyden, MD   CHIEF COMPLAINT:  CC: Oropharyngeal squamous cell carcinoma    ASSESSMENT & PLAN:   Assessment & Plan: Gregory Fisher  is a 72 y.o. 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 Baseline labs stable. The PEG tube site appears infected. - Will hold treatment today and send to ER for evaluation of PEG tube site and possible pneumonia  RTC in 2 weeks  Pain around PEG tube site Patient has some inflammation and pus around the PEG tube site.  Prescribed oral antibiotics previously, patient is not aware of his taking it or not.  Site looks worse than previously -Will send to ER for further evaluation and possible IV antibiotics if needed -Patient is having difficulty using his PEG tube, states that he is unaware of what to inject through his PEG tube.  He stated that he is drinking the PEG tube feeds.  Will recommend nutrition to guide him again  Neoplasm related pain Does not report pain today but states that he is unable to swallow as well as previously -Continue Advil as needed   Pneumonia Patient has increased productive cough.  He also has rhinitis.   He does not know if he had fever or not but he always feels chills.  Examination of lungs showed crackles bilaterally. -Will send to the ER for evaluation and further treatment   The patient understands the plans discussed today and is in agreement with them.  He knows to contact our office if he develops concerns prior to his next appointment.  I provided 20 minutes of face-to-face time during this encounter and > 50% was spent counseling as documented under my assessment and plan.    Cindie Crumbly, MD  [facility] [address] [place] [address] Dept: [phone] Dept Fax: [phone]    ONCOLOGY HISTORY:   Oncology History Overview Note  T4N2Mx Squamous Cell Carcinoma of oro pharynx   Squamous cell carcinoma of oropharynx (HCC)  01/28/2023 Imaging   CT neck:  Pharynx and larynx: Enhancing mass in the right aspect of the pharynx, which measures approximately 3.3 x 3.1 x 5.0 cm. The mass appears centered in the oropharynx near the right palatine tonsil, but extends into the hypopharynx more than into the nasopharynx. The larynx is unremarkable.  Lymph nodes: Enlarged, abnormal lymph nodes bilaterally. Representative lymph nodes include a left level 2A lymph node with central low density, which measures 2.1 x 2.5 x 3.4 cm . A right level 2A lymph node also demonstrates central low density and measures 1.8 x 1.3 x 2.4 cm. A rounded right level 1B lymph node measures up to 1.1 cm (series 3, image 41), with some central low density. Abnormally enhancing right level 3 lymph node measures 1.3 x 1.9 x 2.2 cm.  01/29/2023 Pathology Results   A. OROPHARYNGEAL MASS INVOLVING TONSIL AND RIGHT PHARYNGEAL WALL, RIGHT, BIOPSY:  Invasive squamous cell carcinoma, moderately differentiated.  P16 positive, HPV associated         01/29/2023 Procedure   Fiberoptic laryngoscopy with biopsy    01/29/2023 Tumor Marker   CARIS:  CPS:  10 Positive: p16, FGFR3 TMB low: 55mut/Mb NTRK1/2/3: Negative MSI: Stable   02/26/2023 Initial Diagnosis   Oropharyngeal carcinoma (HCC)   03/12/2023 PET scan   IMPRESSION: 1. The right base of tongue and pharyngeal mass is intensely FDG avid compatible with primary head and neck cancer. 2. There is additional tracer uptake within the left lateral wall of the oropharynx. This is nonspecific and may be inflammatory or neoplastic in etiology.  3. Bilateral tracer avid cervical lymph nodes are identified compatible with nodal metastasis. 4. There is a 6 mm nodule within the left midlung abutting the oblique fissure which demonstrates low level FDG uptake. Technically too small to reliably characterize by PET-CT. Attention on follow-up imaging advised. 5. Umbilical hernia contains nonobstructed loops of small bowel. 6. Coronary artery atherosclerotic calcifications.   03/18/2023 Procedure   IR guided port placement IR guided PEG tube placement   03/23/2023 -  Chemotherapy   Patient is on Treatment Plan : HEAD/NECK Carboplatin + Paclitaxel + XRT q7d     03/23/2023 -  Radiation Therapy   Started on radiation       Current Treatment: Chemo RT with carboplatin plus paclitaxel  INTERVAL HISTORY:  Gregory Fisher is here today for follow up.  Patient reported that he does not know how to use his PEG tube even after multiple demonstrations and patient successfully showing how to do it.  He stated that he does not know what to inject through his PEG tube and has been drinking his PEG tube feeds.  He has some difficulty swallowing and pain but is under control with pain medication.  He also reports cough and cold with bringing out a lot of phlegm for the past 2 weeks.  Patient was sent to the ER for pneumonia evaluation last visit but left AMA.  We rediscussed evaluation in the ER for possible pneumonia and PEG tube site infection, patient stated that he will talk to his body and will go.  Patient  continues to lose some weight.  Reemphasized on PEG tube feeding, general cleanliness.  Patient seem to be overwhelmed by everything that is going on and some poor understanding of how to use the PEG tube.   I have reviewed the past medical history, past surgical history, social history and family history with the patient and they are unchanged from previous note.  ALLERGIES:  has No Known Allergies.  MEDICATIONS:  No current facility-administered medications for this visit.   Current Outpatient Medications  Medication Sig Dispense Refill   Accu-Chek Softclix Lancets lancets Use as instructed to check blood sugar 2 (times) a day 200 each 3   atorvastatin (LIPITOR) 80 MG tablet Take 1 tablet (80 mg total) by mouth daily. 100 tablet 0   Blood Glucose Monitoring Suppl (ACCU-CHEK GUIDE ME) w/Device KIT Use as instructed to check blood sugar 2 (times) a day 1 kit 0   CARBOPLATIN IV Inject into the vein once a week.     carvedilol (COREG) 12.5 MG tablet Take 1 tablet (12.5 mg total) by mouth 2 (two) times daily with a meal. 200 tablet 0   cloNIDine (CATAPRES) 0.1 MG tablet Take 1 tablet (0.1  mg total) by mouth 2 (two) times daily. 200 tablet 0   dapagliflozin propanediol (FARXIGA) 5 MG TABS tablet Take 1 tablet (5 mg total) by mouth daily before breakfast. 90 tablet 3   dexamethasone (DECADRON) 4 MG tablet Take 2 tablets daily for 2 days, start the day after chemotherapy. Take with food. 30 tablet 1   furosemide (LASIX) 40 MG tablet Take 1 tablet (40 mg total) by mouth daily. 90 tablet 0   glucose blood (ACCU-CHEK GUIDE) test strip Use as instructed to check blood sugar 2 (times) a day 200 strip 3   LORazepam (ATIVAN) 1 MG tablet TAKE 1 TABLET BY MOUTH EVERY DAY AS NEEDED FOR ANXIETY 20 tablet 0   metFORMIN (GLUCOPHAGE) 500 MG tablet Take 1 tablet (500 mg total) by mouth 2 (two) times daily with a meal. 200 tablet 0   PACLitaxel (TAXOL IV) Inject into the vein once a week.     potassium chloride  (KLOR-CON M10) 10 MEQ tablet Take 1 tablet (10 mEq total) by mouth 2 (two) times daily. 180 tablet 0   tamsulosin (FLOMAX) 0.4 MG CAPS capsule Take 2 capsules (0.8 mg total) by mouth at bedtime. 200 capsule 0   valsartan (DIOVAN) 320 MG tablet Take 0.5 tablets (160 mg total) by mouth daily. 45 tablet 0   Cholecalciferol (VITAMIN D3) 25 MCG (1000 UT) CAPS Take 2 capsules (2,000 Units total) by mouth daily. (Patient not taking: Reported on 04/13/2023) 30 capsule    lidocaine-prilocaine (EMLA) cream Apply a quarter-sized amount to port a cath site and cover with plastic wrap 1 hour prior to infusion appointments (Patient not taking: Reported on 04/13/2023) 30 g 3   ondansetron (ZOFRAN) 8 MG tablet Take 1 tablet (8 mg total) by mouth every 8 (eight) hours as needed for nausea or vomiting. Start on the third day after chemotherapy. (Patient not taking: Reported on 04/13/2023) 30 tablet 1   senna (SENOKOT) 8.6 MG TABS tablet Take 1 tablet (8.6 mg total) by mouth at bedtime. (Patient not taking: Reported on 04/13/2023) 120 tablet 0   sodium chloride (OCEAN) 0.65 % SOLN nasal spray Place 1 spray into both nostrils as needed. (Patient not taking: Reported on 04/13/2023) 30 mL 5   sucralfate (CARAFATE) 1 GM/10ML suspension Take 10 mLs (1 g total) by mouth 4 (four) times daily -  with meals and at bedtime. (Patient not taking: Reported on 04/13/2023) 420 mL 0   Facility-Administered Medications Ordered in Other Visits  Medication Dose Route Frequency Provider Last Rate Last Admin   guaiFENesin-dextromethorphan (ROBITUSSIN DM) 100-10 MG/5ML syrup 10 mL  10 mL Oral Once Cathren Laine, MD         REVIEW OF SYSTEMS:   Constitutional: Denies fevers, chills or abnormal weight loss Eyes: Denies blurriness of vision Ears, nose, mouth, throat, and face: Denies mucositis or sore throat Respiratory: Denies cough, dyspnea or wheezes Cardiovascular: Denies palpitation, chest discomfort or lower extremity  swelling Gastrointestinal:  Denies nausea, heartburn or change in bowel habits Skin: Denies abnormal skin rashes Lymphatics: Denies new lymphadenopathy or easy bruising Neurological:Denies numbness, tingling or new weaknesses Behavioral/Psych: Mood is stable, no new changes  All other systems were reviewed with the patient and are negative.   VITALS:  There were no vitals taken for this visit.  Wt Readings from Last 3 Encounters:  04/13/23 235 lb (106.6 kg)  04/13/23 239 lb 9.6 oz (108.7 kg)  04/06/23 246 lb (111.6 kg)     Performance status (ECOG):  2 - Symptomatic, <50% confined to bed   PHYSICAL EXAM:   GENERAL:alert, no distress and comfortable, patient is unkempt  OROPHARYNX: Palpable hard mass in the right side at least 5 cm x 3cms, nontender, no discharge, no ulceration noted.   NECK: supple, thyroid normal size, non-tender, Palpable lymph node 1 cm in the right IIa  location, non tender, immobile. LYMPH:  no palpable lymphadenopathy in tother cervical, axillary or inguinal LUNGS: clear to auscultation and percussion with normal breathing effort, port in place- no erythema or discharge HEART: regular rate & rhythm and no murmurs and no lower extremity edema ABDOMEN:abdomen soft, non-tender and normal bowel sounds, PEG tube in place with dressing, inflammation/cellulitis around the PEG tube site Musculoskeletal:no cyanosis of digits and no clubbing  PSYCH: alert & oriented x 3 with fluent speech NEURO: no focal motor/sensory deficits  LABORATORY DATA:  I have reviewed the data as listed    Component Value Date/Time   NA 133 (L) 04/13/2023 0930   NA 142 02/05/2012 0000   K 3.5 04/13/2023 0930   K 4.0 02/05/2012 0000   CL 101 04/13/2023 0930   CO2 25 04/13/2023 0930   GLUCOSE 107 (H) 04/13/2023 0930   BUN 30 (H) 04/13/2023 0930   CREATININE 0.86 04/13/2023 0930   CREATININE 1.0 02/05/2012 0000   CALCIUM 9.1 04/13/2023 0930   CALCIUM 10.2 02/05/2012 0000   PROT 6.5  04/13/2023 0930   ALBUMIN 3.1 (L) 04/13/2023 0930   AST 17 04/13/2023 0930   AST 21 02/05/2012 0000   ALT 27 04/13/2023 0930   ALKPHOS 68 04/13/2023 0930   ALKPHOS 62 02/05/2012 0000   BILITOT 0.8 04/13/2023 0930   BILITOT 0.7 02/05/2012 0000   GFRNONAA >60 04/13/2023 0930   GFRAA >60 11/21/2016 1100    Lab Results  Component Value Date   WBC 4.9 04/13/2023   NEUTROABS 3.1 04/13/2023   HGB 12.2 (L) 04/13/2023   HCT 35.9 (L) 04/13/2023   MCV 89.1 04/13/2023   PLT 294 04/13/2023      Chemistry      Component Value Date/Time   NA 133 (L) 04/13/2023 0930   NA 142 02/05/2012 0000   K 3.5 04/13/2023 0930   K 4.0 02/05/2012 0000   CL 101 04/13/2023 0930   CO2 25 04/13/2023 0930   BUN 30 (H) 04/13/2023 0930   CREATININE 0.86 04/13/2023 0930   CREATININE 1.0 02/05/2012 0000      Component Value Date/Time   CALCIUM 9.1 04/13/2023 0930   CALCIUM 10.2 02/05/2012 0000   ALKPHOS 68 04/13/2023 0930   ALKPHOS 62 02/05/2012 0000   AST 17 04/13/2023 0930   AST 21 02/05/2012 0000   ALT 27 04/13/2023 0930   BILITOT 0.8 04/13/2023 0930   BILITOT 0.7 02/05/2012 0000

## 2023-04-13 NOTE — Progress Notes (Signed)
Dr. Anders Simmonds wishes for pt to be evaluated in ED for possible pneumonia and cellulitis surrounding feeding tube w/possible requirement of IV abx. Report called to T. Katherine Roan, ED Consulting civil engineer. Pt transported via wheelchair to ED 5 accompanied by nurse tech.

## 2023-04-13 NOTE — ED Notes (Signed)
Pt requesting some of the samples he received for his g-tube feedings; called cancer center who directed me to the RD and she has some samples of osmolite for patient to take home; pt informed

## 2023-04-13 NOTE — Progress Notes (Signed)
Nutrition Follow-up:  Patient with oropharyngeal cancer. He is receiving concurrent chemoradiation with weekly carboplatin + paclitaxel  (start 10/14). Patient is under the care of Dr. Anders Simmonds.   PEG - 10/9  Met with patient in room prior to start of treatment. Per discussion with Dr. Anders Simmonds, holding treatment today and recommending ED evaluation of PEG tube. Patient reports not feeling well today, says he has a cold. His throat is sore, endorses odynophagia. Recalls eating a few bites of a cheeseburger yesterday. Unable to eat more secondary to pain. Patient says he is drinking Osmolite orally vs putting in tube. This tastes okay to him. Patient reports providing daily PEG care. He is changing guaze daily. States he forgot to this morning. Noted guaze soiled/stained and wet. He is agreeable to let RD remove this. Area around tube red and inflamed. Dark colored discharge at insertion site. Patient reports he continues taking antibiotics. If taking as prescribed, pt would have completed doxycyline on 10/31.    Medications: reviewed   Labs: BUN 30, albumin 3.1  Anthropometrics: Wt 239 lb 9.6 oz today decreased 3% in one week - severe  10/28 - 247 lb 3.2 oz  10/21 - 254 lb 9.6 oz 10/9 - 256 lb  9/26 - 256 lb   Estimated Energy Needs   Kcals: 2500-2800 Protein: 135-155 Fluid: >/= 2.5 L  NUTRITION DIAGNOSIS: Inadequate oral intake continues - pt is supplementing with oral intake of enteral formula (per preference)   INTERVENTION:  Reinforced importance of daily PEG care Pt taken by wheelchair to ED for evaluation of Gtube - noted Covid positive  Provided samples of Osmolite 1.5 per discussion with ED charge nurse - smaples taken to ED (left with security) Ongoing bolus feeding education needed secondary to cognitive decline   MONITORING, EVALUATION, GOAL: wt trends, intake, TF   NEXT VISIT: Monday November 11 during infusion

## 2023-04-13 NOTE — Assessment & Plan Note (Signed)
Patient has increased productive cough.  He also has rhinitis.  He does not know if he had fever or not but he always feels chills.  Examination of lungs showed crackles bilaterally. -Will send to the ER for evaluation and further treatment

## 2023-04-13 NOTE — ED Notes (Signed)
Pt assisted with repositioning. PEG site cleaned and drain sponge applied.

## 2023-04-13 NOTE — Assessment & Plan Note (Signed)
Does not report pain today but states that he is unable to swallow as well as previously -Continue Advil as needed

## 2023-04-13 NOTE — ED Notes (Signed)
Pt states his ride is not here yet but on the way. Discharge instructions reviewed with pt.

## 2023-04-13 NOTE — Assessment & Plan Note (Signed)
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 Baseline labs stable. The PEG tube site appears infected. - Will hold treatment today and send to ER for evaluation of PEG tube site and possible pneumonia  RTC in 2 weeks

## 2023-04-13 NOTE — Assessment & Plan Note (Addendum)
Patient has some inflammation and pus around the PEG tube site.  Prescribed oral antibiotics previously, patient is not aware of his taking it or not.  Site looks worse than previously -Will send to ER for further evaluation and possible IV antibiotics if needed -Patient is having difficulty using his PEG tube, states that he is unaware of what to inject through his PEG tube.  He stated that he is drinking the PEG tube feeds.  Will recommend nutrition to guide him again

## 2023-04-13 NOTE — ED Triage Notes (Signed)
Pt sent from Cancer Center due to possible infection at PEG tube site. Pt also complains of cough.

## 2023-04-13 NOTE — Progress Notes (Signed)
No treatment today per Dr. Anders Simmonds, patient sent to ED via wheelchair for evaluation of PEG tube. Patient left in satisfactory condition.

## 2023-04-13 NOTE — ED Provider Notes (Signed)
[place] EMERGENCY DEPARTMENT AT [facility] Provider Note   CSN: [id number] Arrival date & time: 04/13/23  4098     History  Chief Complaint  Patient presents with   PEG tube-possible infection     PEG tube site redness and inflammation    Gregory Fisher is a 72 y.o. male.  Pt with hx head/neck cancer, s/p radiation/chemo tx, c/o prod cough in past few days, w occasional small amount yellowish phlegm. No new or worsening sore throat. No sinus drainage or pain. No fever or chills. No specific known ill contacts. Non smoker. Denies chest pain or discomfort. No sob. No abd pain or nv. No extremity pain or swelling. Was sent from cancer center - they also noted mild redness around peg tube site.   The history is provided by the patient and medical records.       Home Medications Prior to Admission medications   Medication Sig Start Date End Date Taking? Authorizing Provider  Accu-Chek Softclix Lancets lancets Use as instructed to check blood sugar 2 (times) a day 11/22/21   Eustaquio Boyden, MD  atorvastatin (LIPITOR) 80 MG tablet Take 1 tablet (80 mg total) by mouth daily. 04/07/23   Eustaquio Boyden, MD  Blood Glucose Monitoring Suppl (ACCU-CHEK GUIDE ME) w/Device KIT Use as instructed to check blood sugar 2 (times) a day 11/22/21   Eustaquio Boyden, MD  CARBOPLATIN IV Inject into the vein once a week. 03/23/23   [provider]  carvedilol (COREG) 12.5 MG tablet Take 1 tablet (12.5 mg total) by mouth 2 (two) times daily with a meal. 04/07/23   Eustaquio Boyden, MD  Cholecalciferol (VITAMIN D3) 25 MCG (1000 UT) CAPS Take 2 capsules (2,000 Units total) by mouth daily. Patient not taking: Reported on 04/13/2023 05/05/18   Eustaquio Boyden, MD  cloNIDine (CATAPRES) 0.1 MG tablet Take 1 tablet (0.1 mg total) by mouth 2 (two) times daily. 04/07/23   Eustaquio Boyden, MD  dapagliflozin propanediol (FARXIGA) 5 MG TABS tablet Take 1 tablet (5 mg total) by mouth  daily before breakfast. 05/09/22   Eustaquio Boyden, MD  dexamethasone (DECADRON) 4 MG tablet Take 2 tablets daily for 2 days, start the day after chemotherapy. Take with food. 03/11/23   Cindie Crumbly, MD  furosemide (LASIX) 40 MG tablet Take 1 tablet (40 mg total) by mouth daily. 04/07/23   Eustaquio Boyden, MD  glucose blood (ACCU-CHEK GUIDE) test strip Use as instructed to check blood sugar 2 (times) a day 11/22/21   Eustaquio Boyden, MD  lidocaine-prilocaine (EMLA) cream Apply a quarter-sized amount to port a cath site and cover with plastic wrap 1 hour prior to infusion appointments Patient not taking: Reported on 04/13/2023 03/10/23   Doreatha Massed, MD  LORazepam (ATIVAN) 1 MG tablet TAKE 1 TABLET BY MOUTH EVERY DAY AS NEEDED FOR ANXIETY 02/19/23   Eustaquio Boyden, MD  metFORMIN (GLUCOPHAGE) 500 MG tablet Take 1 tablet (500 mg total) by mouth 2 (two) times daily with a meal. 04/07/23   Eustaquio Boyden, MD  ondansetron (ZOFRAN) 8 MG tablet Take 1 tablet (8 mg total) by mouth every 8 (eight) hours as needed for nausea or vomiting. Start on the third day after chemotherapy. Patient not taking: Reported on 04/13/2023 03/11/23   Cindie Crumbly, MD  PACLitaxel (TAXOL IV) Inject into the vein once a week. 03/23/23   [provider]  potassium chloride (KLOR-CON M10) 10 MEQ tablet Take 1 tablet (10 mEq total) by mouth 2 (two) times  daily. 04/07/23   Eustaquio Boyden, MD  senna (SENOKOT) 8.6 MG TABS tablet Take 1 tablet (8.6 mg total) by mouth at bedtime. Patient not taking: Reported on 04/13/2023 03/30/23   Cindie Crumbly, MD  sodium chloride (OCEAN) 0.65 % SOLN nasal spray Place 1 spray into both nostrils as needed. Patient not taking: Reported on 04/13/2023 03/05/23   Ashok Croon, MD  sucralfate (CARAFATE) 1 GM/10ML suspension Take 10 mLs (1 g total) by mouth 4 (four) times daily -  with meals and at bedtime. Patient not taking: Reported on 04/13/2023 04/06/23   Doreatha Massed, MD  tamsulosin (FLOMAX) 0.4 MG CAPS capsule Take 2 capsules (0.8 mg total) by mouth at bedtime. 04/07/23   Eustaquio Boyden, MD  valsartan (DIOVAN) 320 MG tablet Take 0.5 tablets (160 mg total) by mouth daily. 04/07/23   Eustaquio Boyden, MD      Allergies    Patient has no known allergies.    Review of Systems   Review of Systems  Constitutional:  Negative for chills, diaphoresis and fever.  HENT:  Negative for sore throat.   Eyes:  Negative for redness.  Respiratory:  Positive for cough. Negative for shortness of breath.   Cardiovascular:  Negative for chest pain.  Gastrointestinal:  Negative for abdominal pain and vomiting.  Genitourinary:  Negative for flank pain.  Musculoskeletal:  Negative for back pain and neck pain.  Skin:  Negative for rash.  Neurological:  Negative for headaches.  Hematological:  Does not bruise/bleed easily.  Psychiatric/Behavioral:  Negative for confusion.     Physical Exam Updated Vital Signs BP (!) 103/45   Pulse 72   Temp 97.9 F (36.6 C) (Oral)   Ht 1.676 m (5\' 6" )   Wt 106.6 kg   SpO2 91%   BMI 37.93 kg/m  Physical Exam Vitals and nursing note reviewed.  Constitutional:      Appearance: Normal appearance. He is well-developed.  HENT:     Head: Atraumatic.     Nose: Nose normal.     Mouth/Throat:     Mouth: Mucous membranes are moist.     Pharynx: Oropharynx is clear.  Eyes:     General: No scleral icterus.    Conjunctiva/sclera: Conjunctivae normal.  Neck:     Trachea: No tracheal deviation.  Cardiovascular:     Rate and Rhythm: Normal rate and regular rhythm.     Pulses: Normal pulses.     Heart sounds: Normal heart sounds. No murmur heard.    No friction rub. No gallop.  Pulmonary:     Effort: Pulmonary effort is normal. No accessory muscle usage or respiratory distress.     Breath sounds: Normal breath sounds.  Abdominal:     General: Bowel sounds are normal. There is no distension.     Palpations: Abdomen is  soft.     Tenderness: There is no abdominal tenderness.     Comments: G tube site with mild skin excoriation under phalange. No cellulitis.   Musculoskeletal:        General: No swelling or tenderness.     Cervical back: Normal range of motion and neck supple. No rigidity.     Right lower leg: No edema.     Left lower leg: No edema.  Skin:    General: Skin is warm and dry.     Findings: No rash.  Neurological:     Mental Status: He is alert.     Comments: Alert, speech clear.   Psychiatric:  Mood and Affect: Mood normal.     ED Results / Procedures / Treatments   Labs (all labs ordered are listed, but only abnormal results are displayed) Results for orders placed or performed during the hospital encounter of 04/13/23  Resp panel by RT-PCR (RSV, Flu A&B, Covid) Anterior Nasal Swab   Specimen: Anterior Nasal Swab  Result Value Ref Range   SARS Coronavirus 2 by RT PCR POSITIVE (A) NEGATIVE   Influenza A by PCR NEGATIVE NEGATIVE   Influenza B by PCR NEGATIVE NEGATIVE   Resp Syncytial Virus by PCR NEGATIVE NEGATIVE  CBC  Result Value Ref Range   WBC 4.9 4.0 - 10.5 K/uL   RBC 4.03 (L) 4.22 - 5.81 MIL/uL   Hemoglobin 12.2 (L) 13.0 - 17.0 g/dL   HCT 16.1 (L) 09.6 - 04.5 %   MCV 89.1 80.0 - 100.0 fL   MCH 30.3 26.0 - 34.0 pg   MCHC 34.0 30.0 - 36.0 g/dL   RDW 40.9 81.1 - 91.4 %   Platelets 294 150 - 400 K/uL   nRBC 0.0 0.0 - 0.2 %  Comprehensive metabolic panel  Result Value Ref Range   Sodium 133 (L) 135 - 145 mmol/L   Potassium 3.5 3.5 - 5.1 mmol/L   Chloride 101 98 - 111 mmol/L   CO2 25 22 - 32 mmol/L   Glucose, Bld 107 (H) 70 - 99 mg/dL   BUN 30 (H) 8 - 23 mg/dL   Creatinine, Ser 7.82 0.61 - 1.24 mg/dL   Calcium 9.1 8.9 - 95.6 mg/dL   Total Protein 6.5 6.5 - 8.1 g/dL   Albumin 3.1 (L) 3.5 - 5.0 g/dL   AST 17 15 - 41 U/L   ALT 27 0 - 44 U/L   Alkaline Phosphatase 68 38 - 126 U/L   Total Bilirubin 0.8 <1.2 mg/dL   GFR, Estimated >21 >30 mL/min   Anion gap 7 5  - 15   DG Chest 2 View  Result Date: 04/13/2023 CLINICAL DATA:  Possible infection at PEG site, cough EXAM: CHEST - 2 VIEW COMPARISON:  Chest radiograph 04/06/2023 FINDINGS: The right chest wall port is stable in position terminating in the region of the cavoatrial junction. The cardiomediastinal silhouette is stable. There is no focal consolidation or pulmonary edema. There is no pleural effusion or pneumothorax There is no acute osseous abnormality. Cervical spine fusion hardware is noted. IMPRESSION: Stable chest with no radiographic evidence of acute cardiopulmonary process. Electronically Signed   By: Lesia Hausen M.D.   On: 04/13/2023 11:48   DG Chest 2 View  Result Date: 04/06/2023 CLINICAL DATA:  Cough. EXAM: CHEST - 2 VIEW COMPARISON:  10/12/2014. FINDINGS: Bilateral lung fields are clear. Bilateral costophrenic angles are clear. Normal cardio-mediastinal silhouette. No acute osseous abnormalities. C7-T1 spinal fixation hardware seen. The soft tissues are within normal limits. Right-sided CT Port-A-Cath is seen with its tip overlying the cavoatrial junction region. IMPRESSION: *No active cardiopulmonary disease. Electronically Signed   By: Jules Schick M.D.   On: 04/06/2023 14:25   IR Gastrostomy Tube  Result Date: 03/18/2023 INDICATION: History of head neck cancer. In need of gastrostomy tube placement for enteric nutrition supplementation purposes prior to initiating chemoradiation. EXAM: PULL TROUGH GASTROSTOMY TUBE PLACEMENT COMPARISON:  PET-CT-03/12/2023 MEDICATIONS: Glucagon 1 mg IV CONTRAST:  15 mL of Omnipaque 300 administered into the gastric lumen. ANESTHESIA/SEDATION: Moderate (conscious) sedation was employed during this procedure. A total of Versed 0.5 mg and Fentanyl 25 mcg was administered intravenously.  Moderate Sedation Time: 10 minutes. The patient's level of consciousness and vital signs were monitored continuously by radiology nursing throughout the procedure under my  direct supervision. FLUOROSCOPY TIME:  1 minute, 6 seconds (50 mGy) COMPLICATIONS: None immediate. PROCEDURE: Informed written consent was obtained from the patient following explanation of the procedure, risks, benefits and alternatives. A time out was performed prior to the initiation of the procedure. Ultrasound scanning was performed to demarcate the edge of the left lobe of the liver. Maximal barrier sterile technique utilized including caps, mask, sterile gowns, sterile gloves, large sterile drape, hand hygiene and Betadine prep. The left upper quadrant was sterilely prepped and draped. An oral gastric catheter was inserted into the stomach under fluoroscopy. The existing nasogastric feeding tube was removed. The left costal margin and air opacified transverse colon were identified and avoided. Air was injected into the stomach for insufflation and visualization under fluoroscopy. Under sterile conditions a 17 gauge trocar needle was utilized to access the stomach percutaneously beneath the left subcostal margin after the overlying soft tissues were anesthetized with 1% Lidocaine with epinephrine. Needle position was confirmed within the stomach with aspiration of air and injection of small amount of contrast. A single T tack was deployed for gastropexy. Over an Amplatz guide wire, a 9-French sheath was inserted into the stomach. A snare device was utilized to capture the oral gastric catheter. The snare device was pulled retrograde from the stomach up the esophagus and out the oropharynx. The 20-French pull-through gastrostomy was connected to the snare device and pulled antegrade through the oropharynx down the esophagus into the stomach and then through the percutaneous tract external to the patient. The gastrostomy was assembled externally. Contrast injection confirms appropriate positioning within the stomach. Several spot radiographic images were obtained in various obliquities for documentation.  Dressings were applied. The patient tolerated procedure well without immediate post procedural complication. FINDINGS: After successful fluoroscopic guided placement, the gastrostomy tube is appropriately positioned with internal disc positioned against the inner ventral wall of the gastric lumen. IMPRESSION: Successful fluoroscopic insertion of a 20-French pull-through gastrostomy tube. The gastrostomy may be used immediately for medication administration and in 24 hrs for the initiation of feeds. Electronically Signed   By: Simonne Come M.D.   On: 03/18/2023 15:02   IR IMAGING GUIDED PORT INSERTION  Result Date: 03/18/2023 INDICATION: History of head neck cancer. In need of durable intravenous access for chemotherapy administration. EXAM: IMPLANTED PORT A CATH PLACEMENT WITH ULTRASOUND AND FLUOROSCOPIC GUIDANCE COMPARISON:  PET-CT-03/11/2023 MEDICATIONS: None ANESTHESIA/SEDATION: Moderate (conscious) sedation was employed during this procedure as administered by the Interventional Radiology RN. A total of Versed 1 mg and Fentanyl 50 mcg was administered intravenously. Moderate Sedation Time: 24 minutes. The patient's level of consciousness and vital signs were monitored continuously by radiology nursing throughout the procedure under my direct supervision. CONTRAST:  None FLUOROSCOPY TIME:  30 seconds (9 mGy) COMPLICATIONS: None immediate. PROCEDURE: The procedure, risks, benefits, and alternatives were explained to the patient. Questions regarding the procedure were encouraged and answered. The patient understands and consents to the procedure. The right neck and chest were prepped with chlorhexidine in a sterile fashion, and a sterile drape was applied covering the operative field. Maximum barrier sterile technique with sterile gowns and gloves were used for the procedure. A timeout was performed prior to the initiation of the procedure. Local anesthesia was provided with 1% lidocaine with epinephrine. After  creating a small venotomy incision, a micropuncture kit was utilized to access  the internal jugular vein. Real-time ultrasound guidance was utilized for vascular access including the acquisition of a permanent ultrasound image documenting patency of the accessed vessel. The microwire was utilized to measure appropriate catheter length. A subcutaneous port pocket was then created along the upper chest wall utilizing a combination of sharp and blunt dissection. The pocket was irrigated with sterile saline. A single lumen [employer] power injectable port was chosen for placement. The 8 Fr catheter was tunneled from the port pocket site to the venotomy incision. The port was placed in the pocket. The external catheter was trimmed to appropriate length. At the venotomy, an 8 Fr peel-away sheath was placed over a guidewire under fluoroscopic guidance. The catheter was then placed through the sheath and the sheath was removed. Final catheter positioning was confirmed and documented with a fluoroscopic spot radiograph. The port was accessed with a Huber needle, aspirated and flushed with heparinized saline. The venotomy site was closed with an interrupted 4-0 Vicryl suture. The port pocket incision was closed with interrupted 2-0 Vicryl suture. The skin was opposed with a running subcuticular 4-0 Vicryl suture. Dermabond and Steri-strips were applied to both incisions. Dressings were applied. The patient tolerated the procedure well without immediate post procedural complication. FINDINGS: After catheter placement, the tip lies within the superior cavoatrial junction. The catheter aspirates and flushes normally and is ready for immediate use. IMPRESSION: Successful placement of a right internal jugular approach power injectable Port-A-Cath. The catheter is ready for immediate use. Electronically Signed   By: Simonne Come M.D.   On: 03/18/2023 15:00    EKG None  Radiology DG Chest 2 View  Result Date:  04/13/2023 CLINICAL DATA:  Possible infection at PEG site, cough EXAM: CHEST - 2 VIEW COMPARISON:  Chest radiograph 04/06/2023 FINDINGS: The right chest wall port is stable in position terminating in the region of the cavoatrial junction. The cardiomediastinal silhouette is stable. There is no focal consolidation or pulmonary edema. There is no pleural effusion or pneumothorax There is no acute osseous abnormality. Cervical spine fusion hardware is noted. IMPRESSION: Stable chest with no radiographic evidence of acute cardiopulmonary process. Electronically Signed   By: Lesia Hausen M.D.   On: 04/13/2023 11:48    Procedures Procedures    Medications Ordered in ED Medications - No data to display  ED Course/ Medical Decision Making/ A&P                                 Medical Decision Making Problems Addressed: Acute cough: acute illness or injury with systemic symptoms COVID-19 virus infection: acute illness or injury with systemic symptoms that poses a threat to life or bodily functions Skin excoriation: acute illness or injury  Amount and/or Complexity of Data Reviewed External Data Reviewed: notes. Labs: ordered. Decision-making details documented in ED Course. Radiology: ordered and independent interpretation performed. Decision-making details documented in ED Course.  Risk OTC drugs. Decision regarding hospitalization.   Iv ns. Continuous pulse ox and cardiac monitoring. Labs ordered/sent. Imaging ordered.   Differential diagnosis includes pna, covid, etc. Dispo decision including potential need for admission considered - will get labs and imaging and reassess.   Reviewed nursing notes and prior charts for additional history. External reports reviewed.   G tube site w mild skin excoriation ?from friction and moisture - area cleaned and dried, drain sponge.   Cardiac monitor: sinus rhythm, rate 66.  Labs reviewed/interpreted by me -  wbc normal. Hct 36.   Xrays  reviewed/interpreted by me - no pna.   Pt is breathing comfortably. O2 sats currently 96%.  Pt appears stable for d/c.   Rec close pcp f/u.  Return precautions provided.          Final Clinical Impression(s) / ED Diagnoses Final diagnoses:  COVID-19 virus infection  Acute cough  Skin excoriation    Rx / DC Orders ED Discharge Orders     None         Cathren Laine, MD 04/13/23 1216

## 2023-04-14 DIAGNOSIS — C108 Malignant neoplasm of overlapping sites of oropharynx: Secondary | ICD-10-CM | POA: Diagnosis not present

## 2023-04-14 DIAGNOSIS — Z510 Encounter for antineoplastic radiation therapy: Secondary | ICD-10-CM | POA: Diagnosis not present

## 2023-04-14 DIAGNOSIS — C770 Secondary and unspecified malignant neoplasm of lymph nodes of head, face and neck: Secondary | ICD-10-CM | POA: Diagnosis not present

## 2023-04-14 DIAGNOSIS — Z8616 Personal history of COVID-19: Secondary | ICD-10-CM | POA: Diagnosis not present

## 2023-04-14 DIAGNOSIS — L539 Erythematous condition, unspecified: Secondary | ICD-10-CM | POA: Diagnosis not present

## 2023-04-14 DIAGNOSIS — Z79899 Other long term (current) drug therapy: Secondary | ICD-10-CM | POA: Diagnosis not present

## 2023-04-14 DIAGNOSIS — Z931 Gastrostomy status: Secondary | ICD-10-CM | POA: Diagnosis not present

## 2023-04-14 DIAGNOSIS — K1230 Oral mucositis (ulcerative), unspecified: Secondary | ICD-10-CM | POA: Diagnosis not present

## 2023-04-15 ENCOUNTER — Ambulatory Visit: Payer: Self-pay | Admitting: *Deleted

## 2023-04-15 DIAGNOSIS — C770 Secondary and unspecified malignant neoplasm of lymph nodes of head, face and neck: Secondary | ICD-10-CM | POA: Diagnosis not present

## 2023-04-15 DIAGNOSIS — C108 Malignant neoplasm of overlapping sites of oropharynx: Secondary | ICD-10-CM | POA: Diagnosis not present

## 2023-04-15 DIAGNOSIS — Z8616 Personal history of COVID-19: Secondary | ICD-10-CM | POA: Diagnosis not present

## 2023-04-15 DIAGNOSIS — K1230 Oral mucositis (ulcerative), unspecified: Secondary | ICD-10-CM | POA: Diagnosis not present

## 2023-04-15 DIAGNOSIS — Z931 Gastrostomy status: Secondary | ICD-10-CM | POA: Diagnosis not present

## 2023-04-15 DIAGNOSIS — Z79899 Other long term (current) drug therapy: Secondary | ICD-10-CM | POA: Diagnosis not present

## 2023-04-15 DIAGNOSIS — Z510 Encounter for antineoplastic radiation therapy: Secondary | ICD-10-CM | POA: Diagnosis not present

## 2023-04-15 DIAGNOSIS — L539 Erythematous condition, unspecified: Secondary | ICD-10-CM | POA: Diagnosis not present

## 2023-04-15 NOTE — Patient Instructions (Signed)
Visit Information  Thank you for taking time to visit with me today. Please don't hesitate to contact me if I can be of assistance to you.   Following are the goals we discussed today:  Please contact your provider with any questions regarding your medical care Please call this social worker with any additional community resource needs  If you are experiencing a Mental Health or Behavioral Health Crisis or need someone to talk to, please call 911   Patient verbalizes understanding of instructions and care plan provided today and agrees to view in MyChart. Active MyChart status and patient understanding of how to access instructions and care plan via MyChart confirmed with patient.     No further follow up required: patient to contact this Child psychotherapist with any additional community resource needs  [employer], [employer] [place]  [facility], [facility] Licensed Clinical Social Geologist, engineering Dial: [phone]

## 2023-04-15 NOTE — Patient Outreach (Signed)
  Care Coordination   Follow Up Visit Note   04/15/2023 Name: Gregory Fisher MRN: [id number] DOB: [birth year]  Gregory Fisher is a 72 y.o. year old male who sees Eustaquio Boyden, MD for primary care. I spoke with  Myles Lipps by phone today.  What matters to the patients health and wellness today?   Patient confirms having no additional community resource needs at this time.   Goals Addressed             This Visit's Progress    Care coordination activities       Interventions Today    Flowsheet Row Most Recent Value  Chronic Disease   Chronic disease during today's visit Other  [Cancer, curently covid positive]  General Interventions   General Interventions Discussed/Reviewed [facility]  [needs assessment completed-pt confirms chemotherpay 1x per wek and Radiation 5 days a week. resides with best friend who provides support and transportation to medical appointments]  Mental Health Interventions   Mental Health Discussed/Reviewed Mental Health Discussed, Coping Strategies, Depression  [emotional support continues to provided related to his medical condition-encouraged verbalization of feelings, discussed coping strategies utilized -encouraged acceptance of support fron family and friends.]  Nutrition Interventions   Nutrition Discussed/Reviewed Nutrition Reviewed  [Pt discussed difficulty swallowing, now has a PEG received 1 case of osmolite from Cancer Center]              SDOH assessments and interventions completed:  No     Care Coordination Interventions:  Yes, provided   Follow up plan: No further intervention required.   Encounter Outcome:  Patient Visit Completed

## 2023-04-16 ENCOUNTER — Inpatient Hospital Stay: Payer: Medicare HMO | Admitting: Licensed Clinical Social Worker

## 2023-04-16 ENCOUNTER — Other Ambulatory Visit: Payer: Medicare HMO

## 2023-04-16 DIAGNOSIS — Z931 Gastrostomy status: Secondary | ICD-10-CM | POA: Diagnosis not present

## 2023-04-16 DIAGNOSIS — Z510 Encounter for antineoplastic radiation therapy: Secondary | ICD-10-CM | POA: Diagnosis not present

## 2023-04-16 DIAGNOSIS — Z8616 Personal history of COVID-19: Secondary | ICD-10-CM | POA: Diagnosis not present

## 2023-04-16 DIAGNOSIS — C109 Malignant neoplasm of oropharynx, unspecified: Secondary | ICD-10-CM

## 2023-04-16 DIAGNOSIS — C108 Malignant neoplasm of overlapping sites of oropharynx: Secondary | ICD-10-CM | POA: Diagnosis not present

## 2023-04-16 DIAGNOSIS — L539 Erythematous condition, unspecified: Secondary | ICD-10-CM | POA: Diagnosis not present

## 2023-04-16 DIAGNOSIS — K1230 Oral mucositis (ulcerative), unspecified: Secondary | ICD-10-CM | POA: Diagnosis not present

## 2023-04-16 DIAGNOSIS — Z79899 Other long term (current) drug therapy: Secondary | ICD-10-CM | POA: Diagnosis not present

## 2023-04-16 DIAGNOSIS — C770 Secondary and unspecified malignant neoplasm of lymph nodes of head, face and neck: Secondary | ICD-10-CM | POA: Diagnosis not present

## 2023-04-16 NOTE — Progress Notes (Signed)
[facility] CSW Progress Note  Clinical Child psychotherapist contacted patient by phone to inquire about proof of income.  Pt states he provided Marijean Niemann with a copy.  CSW reached out to Marijean Niemann who forwarded a copy of pt's proof of income which has been given to the financial resource specialist.  Pt informed he will now be able to pick up gas cards or bring in home bills for payment.  CSW sent a message to inquire when a [employer] inspection can be set up to address bed bugs, awaiting response.  CSW to remain available as appropriate.     Rachel Moulds, LCSW Clinical Social Worker [facility]

## 2023-04-17 DIAGNOSIS — K1230 Oral mucositis (ulcerative), unspecified: Secondary | ICD-10-CM | POA: Diagnosis not present

## 2023-04-17 DIAGNOSIS — Z510 Encounter for antineoplastic radiation therapy: Secondary | ICD-10-CM | POA: Diagnosis not present

## 2023-04-17 DIAGNOSIS — C770 Secondary and unspecified malignant neoplasm of lymph nodes of head, face and neck: Secondary | ICD-10-CM | POA: Diagnosis not present

## 2023-04-17 DIAGNOSIS — Z8616 Personal history of COVID-19: Secondary | ICD-10-CM | POA: Diagnosis not present

## 2023-04-17 DIAGNOSIS — C108 Malignant neoplasm of overlapping sites of oropharynx: Secondary | ICD-10-CM | POA: Diagnosis not present

## 2023-04-17 DIAGNOSIS — Z79899 Other long term (current) drug therapy: Secondary | ICD-10-CM | POA: Diagnosis not present

## 2023-04-17 DIAGNOSIS — L539 Erythematous condition, unspecified: Secondary | ICD-10-CM | POA: Diagnosis not present

## 2023-04-17 DIAGNOSIS — Z931 Gastrostomy status: Secondary | ICD-10-CM | POA: Diagnosis not present

## 2023-04-20 ENCOUNTER — Encounter: Payer: Self-pay | Admitting: Oncology

## 2023-04-20 ENCOUNTER — Inpatient Hospital Stay: Payer: Medicare HMO | Admitting: Dietician

## 2023-04-20 ENCOUNTER — Inpatient Hospital Stay: Payer: Medicare HMO

## 2023-04-20 ENCOUNTER — Inpatient Hospital Stay (HOSPITAL_BASED_OUTPATIENT_CLINIC_OR_DEPARTMENT_OTHER): Payer: Medicare HMO | Admitting: Oncology

## 2023-04-20 VITALS — BP 115/62 | HR 65 | Temp 97.9°F | Resp 18

## 2023-04-20 DIAGNOSIS — T85848A Pain due to other internal prosthetic devices, implants and grafts, initial encounter: Secondary | ICD-10-CM | POA: Diagnosis not present

## 2023-04-20 DIAGNOSIS — C109 Malignant neoplasm of oropharynx, unspecified: Secondary | ICD-10-CM

## 2023-04-20 DIAGNOSIS — Z8616 Personal history of COVID-19: Secondary | ICD-10-CM | POA: Diagnosis not present

## 2023-04-20 DIAGNOSIS — Z5919 Other inadequate housing: Secondary | ICD-10-CM | POA: Diagnosis not present

## 2023-04-20 DIAGNOSIS — U099 Post covid-19 condition, unspecified: Secondary | ICD-10-CM | POA: Diagnosis not present

## 2023-04-20 DIAGNOSIS — G893 Neoplasm related pain (acute) (chronic): Secondary | ICD-10-CM | POA: Diagnosis not present

## 2023-04-20 DIAGNOSIS — F32A Depression, unspecified: Secondary | ICD-10-CM | POA: Diagnosis not present

## 2023-04-20 DIAGNOSIS — J310 Chronic rhinitis: Secondary | ICD-10-CM | POA: Diagnosis not present

## 2023-04-20 DIAGNOSIS — Z510 Encounter for antineoplastic radiation therapy: Secondary | ICD-10-CM | POA: Diagnosis not present

## 2023-04-20 DIAGNOSIS — C770 Secondary and unspecified malignant neoplasm of lymph nodes of head, face and neck: Secondary | ICD-10-CM | POA: Diagnosis not present

## 2023-04-20 DIAGNOSIS — T85848D Pain due to other internal prosthetic devices, implants and grafts, subsequent encounter: Secondary | ICD-10-CM

## 2023-04-20 DIAGNOSIS — L539 Erythematous condition, unspecified: Secondary | ICD-10-CM | POA: Diagnosis not present

## 2023-04-20 DIAGNOSIS — Z79899 Other long term (current) drug therapy: Secondary | ICD-10-CM | POA: Diagnosis not present

## 2023-04-20 DIAGNOSIS — Z931 Gastrostomy status: Secondary | ICD-10-CM | POA: Diagnosis not present

## 2023-04-20 DIAGNOSIS — R1310 Dysphagia, unspecified: Secondary | ICD-10-CM | POA: Diagnosis not present

## 2023-04-20 DIAGNOSIS — C108 Malignant neoplasm of overlapping sites of oropharynx: Secondary | ICD-10-CM | POA: Diagnosis not present

## 2023-04-20 DIAGNOSIS — K1230 Oral mucositis (ulcerative), unspecified: Secondary | ICD-10-CM | POA: Diagnosis not present

## 2023-04-20 DIAGNOSIS — R059 Cough, unspecified: Secondary | ICD-10-CM | POA: Diagnosis not present

## 2023-04-20 DIAGNOSIS — Z5111 Encounter for antineoplastic chemotherapy: Secondary | ICD-10-CM | POA: Diagnosis not present

## 2023-04-20 LAB — CBC WITH DIFFERENTIAL/PLATELET
Abs Immature Granulocytes: 0.01 10*3/uL (ref 0.00–0.07)
Basophils Absolute: 0 10*3/uL (ref 0.0–0.1)
Basophils Relative: 0 %
Eosinophils Absolute: 0 10*3/uL (ref 0.0–0.5)
Eosinophils Relative: 1 %
HCT: 37.2 % — ABNORMAL LOW (ref 39.0–52.0)
Hemoglobin: 12.5 g/dL — ABNORMAL LOW (ref 13.0–17.0)
Immature Granulocytes: 0 %
Lymphocytes Relative: 7 %
Lymphs Abs: 0.4 10*3/uL — ABNORMAL LOW (ref 0.7–4.0)
MCH: 30.2 pg (ref 26.0–34.0)
MCHC: 33.6 g/dL (ref 30.0–36.0)
MCV: 89.9 fL (ref 80.0–100.0)
Monocytes Absolute: 0.5 10*3/uL (ref 0.1–1.0)
Monocytes Relative: 10 %
Neutro Abs: 4.2 10*3/uL (ref 1.7–7.7)
Neutrophils Relative %: 82 %
Platelets: 164 10*3/uL (ref 150–400)
RBC: 4.14 MIL/uL — ABNORMAL LOW (ref 4.22–5.81)
RDW: 14.1 % (ref 11.5–15.5)
WBC: 5.1 10*3/uL (ref 4.0–10.5)
nRBC: 0 % (ref 0.0–0.2)

## 2023-04-20 LAB — COMPREHENSIVE METABOLIC PANEL
ALT: 20 U/L (ref 0–44)
AST: 16 U/L (ref 15–41)
Albumin: 3.1 g/dL — ABNORMAL LOW (ref 3.5–5.0)
Alkaline Phosphatase: 81 U/L (ref 38–126)
Anion gap: 7 (ref 5–15)
BUN: 14 mg/dL (ref 8–23)
CO2: 28 mmol/L (ref 22–32)
Calcium: 9 mg/dL (ref 8.9–10.3)
Chloride: 99 mmol/L (ref 98–111)
Creatinine, Ser: 0.82 mg/dL (ref 0.61–1.24)
GFR, Estimated: >60 mL/min (ref >60)
Glucose, Bld: 132 mg/dL — ABNORMAL HIGH (ref 70–99)
Potassium: 3.7 mmol/L (ref 3.5–5.1)
Sodium: 134 mmol/L — ABNORMAL LOW (ref 135–145)
Total Bilirubin: 0.7 mg/dL (ref <1.2)
Total Protein: 6.5 g/dL (ref 6.5–8.1)

## 2023-04-20 LAB — MAGNESIUM: Magnesium: 2.1 mg/dL (ref 1.7–2.4)

## 2023-04-20 MED ORDER — PALONOSETRON HCL INJECTION 0.25 MG/5ML
0.2500 mg | Freq: Once | INTRAVENOUS | Status: AC
  Administered 2023-04-20: 0.25 mg via INTRAVENOUS
  Filled 2023-04-20: qty 5

## 2023-04-20 MED ORDER — SODIUM CHLORIDE 0.9 % IV SOLN
270.0000 mg | Freq: Once | INTRAVENOUS | Status: AC
  Administered 2023-04-20: 270 mg via INTRAVENOUS
  Filled 2023-04-20: qty 27

## 2023-04-20 MED ORDER — BENZONATATE 100 MG PO CAPS
100.0000 mg | ORAL_CAPSULE | Freq: Three times a day (TID) | ORAL | 0 refills | Status: DC | PRN
Start: 2023-04-20 — End: 2023-04-27

## 2023-04-20 MED ORDER — DEXAMETHASONE SODIUM PHOSPHATE 10 MG/ML IJ SOLN
10.0000 mg | Freq: Once | INTRAMUSCULAR | Status: AC
  Administered 2023-04-20: 10 mg via INTRAVENOUS
  Filled 2023-04-20: qty 1

## 2023-04-20 MED ORDER — FAMOTIDINE IN NACL 20-0.9 MG/50ML-% IV SOLN
20.0000 mg | Freq: Once | INTRAVENOUS | Status: AC
  Administered 2023-04-20: 20 mg via INTRAVENOUS
  Filled 2023-04-20: qty 50

## 2023-04-20 MED ORDER — PACLITAXEL CHEMO INJECTION 300 MG/50ML
45.0000 mg/m2 | Freq: Once | INTRAVENOUS | Status: AC
  Administered 2023-04-20: 108 mg via INTRAVENOUS
  Filled 2023-04-20: qty 18

## 2023-04-20 MED ORDER — HEPARIN SOD (PORK) LOCK FLUSH 100 UNIT/ML IV SOLN
500.0000 [IU] | Freq: Once | INTRAVENOUS | Status: AC | PRN
  Administered 2023-04-20: 500 [IU]

## 2023-04-20 MED ORDER — DEXAMETHASONE SODIUM PHOSPHATE 100 MG/10ML IJ SOLN: 10.0000 mg | Freq: Once | INTRAMUSCULAR | Status: DC

## 2023-04-20 MED ORDER — SODIUM CHLORIDE 0.9 % IV SOLN
Freq: Once | INTRAVENOUS | Status: AC
Start: 2023-04-20 — End: 2023-04-20

## 2023-04-20 MED ORDER — SODIUM CHLORIDE 0.9% FLUSH
10.0000 mL | INTRAVENOUS | Status: DC | PRN
Start: 2023-04-20 — End: 2023-04-20
  Administered 2023-04-20: 10 mL

## 2023-04-20 MED ORDER — DIPHENHYDRAMINE HCL 50 MG/ML IJ SOLN
50.0000 mg | Freq: Once | INTRAMUSCULAR | Status: AC
  Administered 2023-04-20: 50 mg via INTRAVENOUS
  Filled 2023-04-20: qty 1

## 2023-04-20 NOTE — Patient Instructions (Signed)

## 2023-04-20 NOTE — Progress Notes (Signed)
Labs reviewed with oncology visit today.  Ok to treat verbal order Dr.  Anders Simmonds. Pharmacy aware.   Patient tolerated chemotherapy with no complaints voiced.  Side effects with management reviewed with understanding verbalized.  Port site clean and dry with no bruising or swelling noted at site.  Good blood return noted before and after administration of chemotherapy.  Band aid applied.  Patient left in satisfactory condition with VSS and no s/s of distress noted.

## 2023-04-20 NOTE — Assessment & Plan Note (Signed)
Persistent cough following recent COVID-19 infection. -Prescribe Tessalon perles for symptomatic relief.

## 2023-04-20 NOTE — Patient Instructions (Signed)
VISIT SUMMARY:  During today's visit, we discussed your persistent cough and difficulty swallowing, which have been affecting your sleep and overall well-being. 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 Additionally, we addressed your recent COVID-19 infection and its impact on your health.  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 For your persistent cough, we have prescribed Tessalon perles to help relieve your symptoms.  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 You are currently undergoing radiation therapy, which has caused difficulty swallowing. We will continue your current treatment plan and have referred you to a nutritionist to help manage your PEG tube feeding. Please continue to consume soft foods orally as much as you can tolerate.  -CHEMOTHERAPY: Chemotherapy is a treatment that uses drugs to kill cancer cells. You missed one session due to your recent COVID-19 infection, but we will resume your chemotherapy today.  -GENERAL HEALTH MAINTENANCE: We will continue to monitor your PEG tube site to ensure it remains healthy and free from infection. We will also check in with you after you complete your radiation therapy. Additional masks have been provided as requested.  INSTRUCTIONS:  Please follow up with the nutritionist for guidance on PEG tube feeding. Continue with your radiation therapy and resume chemotherapy as planned. Monitor your PEG tube site for any signs of infection and contact us if you notice any redness or inflammation. We will check in with you after you complete your radiation therapy.

## 2023-04-20 NOTE — Assessment & Plan Note (Signed)
Does not report pain today and able to eat soft food -Continue Advil as needed

## 2023-04-20 NOTE — Progress Notes (Signed)
Patient Care Team: Eustaquio Boyden, MD as PCP - General (Family Medicine) Julio Sicks, MD as Consulting Physician (Neurosurgery) Jethro Bolus, MD (Inactive) as Consulting Physician (Urology) Kathyrn Sheriff, [facility] (Inactive) as Pharmacist (Pharmacist) [place], [place], [place] as Social Worker Cindie Crumbly, MD as Medical Oncologist (Medical Oncology) Therese Sarah, RN as Oncology Nurse Navigator (Medical Oncology) Juanell Fairly, RN as Case Manager  Clinic Day:  04/20/2023  Referring physician: Eustaquio Boyden, MD   CHIEF COMPLAINT:  CC: Oropharyngeal squamous cell carcinoma    ASSESSMENT & PLAN:   Assessment & Plan: Gregory Fisher  is a 72 y.o. 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 Undergoing radiation therapy with associated dysphagia. Patient is able to consume soft foods orally and is using a PEG tube for additional nutrition. -PET scan showed no evidence of metastatic disease -S/P PEG tube and port placement -Patient tolerating chemo RT C5D1 today. Baseline labs stable.  Held cycle 4 for pneumonia and PEG tube site infection. -Continue current treatment plan. -Refer to nutritionist for further guidance on PEG tube feeding frequency and volume. -Continue to encourage oral intake as tolerated.  RTC in 2 weeks   Post-COVID-19 syndrome Persistent cough following recent COVID-19 infection. -Prescribe Tessalon perles for symptomatic relief.   Pain around PEG tube site Significantly improved.  No erythema around the PEG tube site. -Continue to monitor PEG tube site   Neoplasm related pain Does not report pain today and able to eat soft food -Continue Advil as needed  The patient understands the plans discussed today and is in agreement with them.  He knows to contact our  office if he develops concerns prior to his next appointment.  I provided 20 minutes of face-to-face time during this encounter and > 50% was spent counseling as documented under my assessment and plan.    Cindie Crumbly, MD  [facility] [address] [place] [address] Dept: [phone] Dept Fax: [phone]    ONCOLOGY HISTORY:   Oncology History Overview Note  T4N2Mx Squamous Cell Carcinoma of oro pharynx   Squamous cell carcinoma of oropharynx (HCC)  01/28/2023 Imaging   CT neck:  Pharynx and larynx: Enhancing mass in the right aspect of the pharynx, which measures approximately 3.3 x 3.1 x 5.0 cm. The mass appears centered in the oropharynx near the right palatine tonsil, but extends into the hypopharynx more than into the nasopharynx. The larynx is unremarkable.  Lymph nodes: Enlarged, abnormal lymph nodes bilaterally. Representative lymph nodes include a left level 2A lymph node with central low density, which measures 2.1 x 2.5 x 3.4 cm . A right level 2A lymph node also demonstrates central low density and measures 1.8 x 1.3 x 2.4 cm. A rounded right level 1B lymph node measures up to 1.1 cm (series 3, image 41), with some central low density. Abnormally enhancing right level 3 lymph node measures 1.3 x 1.9 x 2.2 cm.    01/29/2023 Pathology Results   A. OROPHARYNGEAL MASS INVOLVING TONSIL AND RIGHT PHARYNGEAL WALL, RIGHT, BIOPSY:  Invasive squamous cell carcinoma, moderately differentiated.  P16 positive, HPV associated         01/29/2023 Procedure   Fiberoptic laryngoscopy with biopsy    01/29/2023 Tumor Marker   CARIS:  CPS: 10 Positive: p16, FGFR3 TMB low: 39mut/Mb NTRK1/2/3: Negative MSI: Stable  02/26/2023 Initial Diagnosis   Oropharyngeal carcinoma (HCC)   03/12/2023 PET scan   IMPRESSION: 1. The right base of tongue and pharyngeal mass is intensely FDG avid compatible with primary head and neck  cancer. 2. There is additional tracer uptake within the left lateral wall of the oropharynx. This is nonspecific and may be inflammatory or neoplastic in etiology.  3. Bilateral tracer avid cervical lymph nodes are identified compatible with nodal metastasis. 4. There is a 6 mm nodule within the left midlung abutting the oblique fissure which demonstrates low level FDG uptake. Technically too small to reliably characterize by PET-CT. Attention on follow-up imaging advised. 5. Umbilical hernia contains nonobstructed loops of small bowel. 6. Coronary artery atherosclerotic calcifications.   03/18/2023 Procedure   IR guided port placement IR guided PEG tube placement   03/23/2023 -  Chemotherapy   Patient is on Treatment Plan : HEAD/NECK Carboplatin + Paclitaxel + XRT q7d     03/23/2023 -  Radiation Therapy   Started on radiation       Current Treatment: Chemo RT with carboplatin plus paclitaxel  INTERVAL HISTORY:  Gregory Fisher is here today for follow up.  On the last visit patient went to the ER for evaluation of pneumonia and PEG tube site infection.  He was found to have COVID-19 infection with residual cough currently. He describes the cough as severe and productive of phlegm, which has been disrupting his sleep. He also expresses feeling overwhelmed by his current health situation, including the need to manage his PEG tube for feeding due to difficulty swallowing.He has been trying to consume soft foods orally, but finds it challenging due to inflammation from radiation therapy.  He did not miss any radiation sessions.  I have reviewed the past medical history, past surgical history, social history and family history with the patient and they are unchanged from previous note.  ALLERGIES:  has No Known Allergies.  MEDICATIONS:  Current Outpatient Medications  Medication Sig Dispense Refill   Accu-Chek Softclix Lancets lancets Use as instructed to check blood sugar 2 (times) a day  200 each 3   atorvastatin (LIPITOR) 80 MG tablet Take 1 tablet (80 mg total) by mouth daily. 100 tablet 0   benzonatate (TESSALON) 100 MG capsule Take 1 capsule (100 mg total) by mouth 3 (three) times daily as needed for cough. 20 capsule 0   Blood Glucose Monitoring Suppl (ACCU-CHEK GUIDE ME) w/Device KIT Use as instructed to check blood sugar 2 (times) a day 1 kit 0   CARBOPLATIN IV Inject into the vein once a week.     carvedilol (COREG) 12.5 MG tablet Take 1 tablet (12.5 mg total) by mouth 2 (two) times daily with a meal. 200 tablet 0   Cholecalciferol (VITAMIN D3) 25 MCG (1000 UT) CAPS Take 2 capsules (2,000 Units total) by mouth daily. 30 capsule    cloNIDine (CATAPRES) 0.1 MG tablet Take 1 tablet (0.1 mg total) by mouth 2 (two) times daily. 200 tablet 0   dapagliflozin propanediol (FARXIGA) 5 MG TABS tablet Take 1 tablet (5 mg total) by mouth daily before breakfast. 90 tablet 3   dexamethasone (DECADRON) 4 MG tablet Take 2 tablets daily for 2 days, start the day after chemotherapy. Take with food. 30 tablet 1   furosemide (LASIX) 40 MG tablet Take 1 tablet (40 mg total) by mouth daily. 90 tablet 0   glucose blood (ACCU-CHEK GUIDE) test strip Use as instructed to check blood sugar 2 (times) a  day 200 strip 3   lidocaine-prilocaine (EMLA) cream Apply a quarter-sized amount to port a cath site and cover with plastic wrap 1 hour prior to infusion appointments 30 g 3   LORazepam (ATIVAN) 1 MG tablet TAKE 1 TABLET BY MOUTH EVERY DAY AS NEEDED FOR ANXIETY 20 tablet 0   metFORMIN (GLUCOPHAGE) 500 MG tablet Take 1 tablet (500 mg total) by mouth 2 (two) times daily with a meal. 200 tablet 0   ondansetron (ZOFRAN) 8 MG tablet Take 1 tablet (8 mg total) by mouth every 8 (eight) hours as needed for nausea or vomiting. Start on the third day after chemotherapy. 30 tablet 1   PACLitaxel (TAXOL IV) Inject into the vein once a week.     potassium chloride (KLOR-CON M10) 10 MEQ tablet Take 1 tablet (10 mEq  total) by mouth 2 (two) times daily. 180 tablet 0   senna (SENOKOT) 8.6 MG TABS tablet Take 1 tablet (8.6 mg total) by mouth at bedtime. 120 tablet 0   sodium chloride (OCEAN) 0.65 % SOLN nasal spray Place 1 spray into both nostrils as needed. 30 mL 5   sucralfate (CARAFATE) 1 GM/10ML suspension Take 10 mLs (1 g total) by mouth 4 (four) times daily -  with meals and at bedtime. 420 mL 0   tamsulosin (FLOMAX) 0.4 MG CAPS capsule Take 2 capsules (0.8 mg total) by mouth at bedtime. 200 capsule 0   valsartan (DIOVAN) 320 MG tablet Take 0.5 tablets (160 mg total) by mouth daily. 45 tablet 0   No current facility-administered medications for this visit.   Facility-Administered Medications Ordered in Other Visits  Medication Dose Route Frequency Provider Last Rate Last Admin   sodium chloride flush (NS) 0.9 % injection 10 mL  10 mL Intracatheter PRN Cindie Crumbly, MD   10 mL at 04/20/23 1221     REVIEW OF SYSTEMS:   Constitutional: Denies fevers, chills or abnormal weight loss Eyes: Denies blurriness of vision Ears, nose, mouth, throat, and face: Denies mucositis or sore throat Respiratory: Denies cough, dyspnea or wheezes Cardiovascular: Denies palpitation, chest discomfort or lower extremity swelling Gastrointestinal:  Denies nausea, heartburn or change in bowel habits Skin: Denies abnormal skin rashes Lymphatics: Denies new lymphadenopathy or easy bruising Neurological:Denies numbness, tingling or new weaknesses Behavioral/Psych: Mood is stable, no new changes  All other systems were reviewed with the patient and are negative.   VITALS:  There were no vitals taken for this visit.  Wt Readings from Last 3 Encounters:  04/20/23 235 lb 3.2 oz (106.7 kg)  04/13/23 235 lb (106.6 kg)  04/13/23 239 lb 9.6 oz (108.7 kg)     Performance status (ECOG):  2 - Symptomatic, <50% confined to bed   PHYSICAL EXAM:   GENERAL:alert, no distress and comfortable, patient is unkempt  OROPHARYNX:  Palpable hard mass in the right side at least 5 cm x 3cms, nontender, no discharge, no ulceration noted.   NECK: supple, thyroid normal size, non-tender, Palpable lymph node 1 cm in the right IIa  location, non tender, immobile. LYMPH:  no palpable lymphadenopathy in tother cervical, axillary or inguinal LUNGS: clear to auscultation and percussion with normal breathing effort, port in place- no erythema or discharge HEART: regular rate & rhythm and no murmurs and no lower extremity edema ABDOMEN:abdomen soft, non-tender and normal bowel sounds, PEG tube in place with dressing, no erythema around PEG tube site, hernia present Musculoskeletal:no cyanosis of digits and no clubbing  PSYCH: alert & oriented x  3 with fluent speech NEURO: no focal motor/sensory deficits  LABORATORY DATA:  I have reviewed the data as listed     Component Value Date/Time   NA 134 (L) 04/20/2023 0812   NA 142 02/05/2012 0000   K 3.7 04/20/2023 0812   K 4.0 02/05/2012 0000   CL 99 04/20/2023 0812   CO2 28 04/20/2023 0812   GLUCOSE 132 (H) 04/20/2023 0812   BUN 14 04/20/2023 0812   CREATININE 0.82 04/20/2023 0812   CREATININE 1.0 02/05/2012 0000   CALCIUM 9.0 04/20/2023 0812   CALCIUM 10.2 02/05/2012 0000   PROT 6.5 04/20/2023 0812   ALBUMIN 3.1 (L) 04/20/2023 0812   AST 16 04/20/2023 0812   AST 21 02/05/2012 0000   ALT 20 04/20/2023 0812   ALKPHOS 81 04/20/2023 0812   ALKPHOS 62 02/05/2012 0000   BILITOT 0.7 04/20/2023 0812   BILITOT 0.7 02/05/2012 0000   GFRNONAA >60 04/20/2023 0812   GFRAA >60 11/21/2016 1100    Lab Results  Component Value Date   WBC 5.1 04/20/2023   NEUTROABS 4.2 04/20/2023   HGB 12.5 (L) 04/20/2023   HCT 37.2 (L) 04/20/2023   MCV 89.9 04/20/2023   PLT 164 04/20/2023      Chemistry      Component Value Date/Time   NA 134 (L) 04/20/2023 0812   NA 142 02/05/2012 0000   K 3.7 04/20/2023 0812   K 4.0 02/05/2012 0000   CL 99 04/20/2023 0812   CO2 28 04/20/2023 0812    BUN 14 04/20/2023 0812   CREATININE 0.82 04/20/2023 0812   CREATININE 1.0 02/05/2012 0000      Component Value Date/Time   CALCIUM 9.0 04/20/2023 0812   CALCIUM 10.2 02/05/2012 0000   ALKPHOS 81 04/20/2023 0812   ALKPHOS 62 02/05/2012 0000   AST 16 04/20/2023 0812   AST 21 02/05/2012 0000   ALT 20 04/20/2023 0812   BILITOT 0.7 04/20/2023 0812   BILITOT 0.7 02/05/2012 0000

## 2023-04-20 NOTE — Patient Outreach (Signed)
Care Management   Visit Note   Name: Gregory Fisher MRN: [id number] DOB: [birth year]  Subjective: Gregory Fisher is a 72 y.o. year old male who is a primary care patient of Eustaquio Boyden, MD. The Care Management team was consulted for assistance.      Engaged with patient via telephone.  Assessment:  Outpatient Encounter Medications as of 04/16/2023  Medication Sig   Accu-Chek Softclix Lancets lancets Use as instructed to check blood sugar 2 (times) a day   atorvastatin (LIPITOR) 80 MG tablet Take 1 tablet (80 mg total) by mouth daily.   Blood Glucose Monitoring Suppl (ACCU-CHEK GUIDE ME) w/Device KIT Use as instructed to check blood sugar 2 (times) a day   CARBOPLATIN IV Inject into the vein once a week.   carvedilol (COREG) 12.5 MG tablet Take 1 tablet (12.5 mg total) by mouth 2 (two) times daily with a meal.   Cholecalciferol (VITAMIN D3) 25 MCG (1000 UT) CAPS Take 2 capsules (2,000 Units total) by mouth daily. (Patient not taking: Reported on 04/13/2023)   cloNIDine (CATAPRES) 0.1 MG tablet Take 1 tablet (0.1 mg total) by mouth 2 (two) times daily.   dapagliflozin propanediol (FARXIGA) 5 MG TABS tablet Take 1 tablet (5 mg total) by mouth daily before breakfast.   dexamethasone (DECADRON) 4 MG tablet Take 2 tablets daily for 2 days, start the day after chemotherapy. Take with food.   furosemide (LASIX) 40 MG tablet Take 1 tablet (40 mg total) by mouth daily.   glucose blood (ACCU-CHEK GUIDE) test strip Use as instructed to check blood sugar 2 (times) a day   lidocaine-prilocaine (EMLA) cream Apply a quarter-sized amount to port a cath site and cover with plastic wrap 1 hour prior to infusion appointments (Patient not taking: Reported on 04/13/2023)   LORazepam (ATIVAN) 1 MG tablet TAKE 1 TABLET BY MOUTH EVERY DAY AS NEEDED FOR ANXIETY   metFORMIN (GLUCOPHAGE) 500 MG tablet Take 1 tablet (500 mg total) by mouth 2 (two) times daily with a meal.   ondansetron (ZOFRAN) 8 MG tablet Take  1 tablet (8 mg total) by mouth every 8 (eight) hours as needed for nausea or vomiting. Start on the third day after chemotherapy. (Patient not taking: Reported on 04/13/2023)   PACLitaxel (TAXOL IV) Inject into the vein once a week.   potassium chloride (KLOR-CON M10) 10 MEQ tablet Take 1 tablet (10 mEq total) by mouth 2 (two) times daily.   senna (SENOKOT) 8.6 MG TABS tablet Take 1 tablet (8.6 mg total) by mouth at bedtime. (Patient not taking: Reported on 04/13/2023)   sodium chloride (OCEAN) 0.65 % SOLN nasal spray Place 1 spray into both nostrils as needed. (Patient not taking: Reported on 04/13/2023)   sucralfate (CARAFATE) 1 GM/10ML suspension Take 10 mLs (1 g total) by mouth 4 (four) times daily -  with meals and at bedtime. (Patient not taking: Reported on 04/13/2023)   tamsulosin (FLOMAX) 0.4 MG CAPS capsule Take 2 capsules (0.8 mg total) by mouth at bedtime.   valsartan (DIOVAN) 320 MG tablet Take 0.5 tablets (160 mg total) by mouth daily.   No facility-administered encounter medications on file as of 04/16/2023.      Goals Addressed             This Visit's Progress    Care Management       Current Barriers:  Care Management support and education needs related to CHF, HTN, DM and Squamous Cell Carcinoma of Oropharynx  Planned Interventions: CHF and Hypertension Reviewed current treatment plan related to Hypertension, self-management, and adherence to plan as established by provider.  Reviewed medications and indications for use. Reports taking medications as prescribed. Denies current concerns related to medication management. Provided information regarding established blood pressure parameters along with indications for notifying a provider. Advised to monitor BP and record readings.  Reviewed recommended weight parameters. Encouraged to notify provider for weight gain greater than 3 lbs overnight or weight gain greater than 5 lbs within a week. Reports weights has been stable.  Encouraged to weigh at least once a week if unable to weigh daily and write down readings.  Reviewed s/sx of fluid overload and indications for notifying a provider. Reports edema to lower extremities. Denies abdominal edema or tightness. Denies chest discomfort or palpitations. Denies shortness of breath at rest.  Reviewed nutritional intake. Reports receiving Meals on Wheels. Advised to closely monitor sodium consumption and avoid highly processed foods when possible. Discussed activity level. Continues to experience fatigue. Currently completing Radiation treatments r/t Squamous Cell Carcinoma of Oropharynx. Reviewed safety and fall prevention measures. Reports a friend is available to assist as needed. Reviewed worsening s/sx related to CHF exacerbation and indications for seeking immediate medical attention.  BP Readings from Last 3 Encounters:  03/30/23 112/67  03/30/23 114/66  03/23/23 114/70     Lab Results  Component Value Date   CHOL 146 08/19/2021   HDL 42.10 08/19/2021   LDLCALC 80 08/19/2021   LDLDIRECT 69.8 05/09/2014   TRIG 120.0 08/19/2021   CHOLHDL 3 08/19/2021    Wt Readings from Last 3 Encounters:  03/30/23 254 lb 9.6 oz (115.5 kg)  03/23/23 276 lb (125.2 kg)  03/18/23 256 lb (116.1 kg)     Patient Goals/Self-Care Take all medications as prescribed Completed provider appointments as scheduled Monitor weight and record readings Notify provider for weight gain outside of established parameters Monitor blood pressure and record readings Follow recommended safety and fall prevention measures Adhere to recommended cardiac prudent/heart healthy diet. Update Care team if additional nutritional assistance is required

## 2023-04-20 NOTE — Progress Notes (Signed)
Nutrition Follow-up:  Patient with oropharyngeal cancer. He is receiving concurrent chemoradiation with weekly carboplatin + paclitaxel  (start 10/14). Patient is under the care of Dr. Anders Simmonds.   PEG - 10/9  Met with patient in infusion. Patient reports mouth is dry and asking for water. RD provided patient with bottle of water and cup with ice. Patient with short responses today. Says he does not feel up for talking. He states he ate a gallon of ice cream in the last 2 days. He is drinking Ensure as well as the Osmolite. Patient unable to provide quantity. Patient states he has used tube a couple of times. He "tries" to flush with water everyday. Patient reports it is clogged today. He is agreeable to let RD attempt water flush. He denies nausea, vomiting, diarrhea, constipation.    Medications: reviewed   Labs: Na 134, glucose 132, albumin 3.1  Anthropometrics: Wts trending down. Pt 235 lb 3.2 oz today decreased from 239 lb 9.6 oz in one week    Estimated Energy Needs  Kcals: 2500-2800 Protein: 135-155 Fluid: >2.5 L  NUTRITION DIAGNOSIS: Inadequate oral intake -addressing with TF   INTERVENTION:  Ongoing PEG care and bolus feeding education. Reinforced importance of daily water flushes RD flushed tube with 60 ml water without difficulty Reinforced importance of adequate intake of calorie/protein energy to preserve loss of LBM Encouraged oral intake of soft moist smooth foods as tolerated Reviewed nutrition prescription - pt not receptive to education efforts today (staring at television during conversation)    MONITORING, EVALUATION, GOAL: wt trends, intake, TF   NEXT VISIT: Monday November 18 during infusion

## 2023-04-20 NOTE — Assessment & Plan Note (Signed)
Significantly improved.  No erythema around the PEG tube site. -Continue to monitor PEG tube site

## 2023-04-20 NOTE — Assessment & Plan Note (Signed)
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 Undergoing radiation therapy with associated dysphagia. Patient is able to consume soft foods orally and is using a PEG tube for additional nutrition. -PET scan showed no evidence of metastatic disease -S/P PEG tube and port placement -Patient tolerating chemo RT C5D1 today. Baseline labs stable.  Held cycle 4 for pneumonia and PEG tube site infection. -Continue current treatment plan. -Refer to nutritionist for further guidance on PEG tube feeding frequency and volume. -Continue to encourage oral intake as tolerated.  RTC in 2 weeks

## 2023-04-22 ENCOUNTER — Other Ambulatory Visit (HOSPITAL_COMMUNITY): Payer: Medicare HMO

## 2023-04-22 ENCOUNTER — Ambulatory Visit (HOSPITAL_COMMUNITY): Payer: Medicare HMO | Attending: Speech Pathology | Admitting: Speech Pathology

## 2023-04-22 DIAGNOSIS — K1230 Oral mucositis (ulcerative), unspecified: Secondary | ICD-10-CM | POA: Diagnosis not present

## 2023-04-22 DIAGNOSIS — Z79899 Other long term (current) drug therapy: Secondary | ICD-10-CM | POA: Diagnosis not present

## 2023-04-22 DIAGNOSIS — C770 Secondary and unspecified malignant neoplasm of lymph nodes of head, face and neck: Secondary | ICD-10-CM | POA: Diagnosis not present

## 2023-04-22 DIAGNOSIS — Z931 Gastrostomy status: Secondary | ICD-10-CM | POA: Diagnosis not present

## 2023-04-22 DIAGNOSIS — R6889 Other general symptoms and signs: Secondary | ICD-10-CM | POA: Diagnosis not present

## 2023-04-22 DIAGNOSIS — C108 Malignant neoplasm of overlapping sites of oropharynx: Secondary | ICD-10-CM | POA: Diagnosis not present

## 2023-04-22 DIAGNOSIS — L539 Erythematous condition, unspecified: Secondary | ICD-10-CM | POA: Diagnosis not present

## 2023-04-22 DIAGNOSIS — Z510 Encounter for antineoplastic radiation therapy: Secondary | ICD-10-CM | POA: Diagnosis not present

## 2023-04-22 DIAGNOSIS — Z8616 Personal history of COVID-19: Secondary | ICD-10-CM | POA: Diagnosis not present

## 2023-04-23 DIAGNOSIS — Z510 Encounter for antineoplastic radiation therapy: Secondary | ICD-10-CM | POA: Diagnosis not present

## 2023-04-23 DIAGNOSIS — Z931 Gastrostomy status: Secondary | ICD-10-CM | POA: Diagnosis not present

## 2023-04-23 DIAGNOSIS — Z8616 Personal history of COVID-19: Secondary | ICD-10-CM | POA: Diagnosis not present

## 2023-04-23 DIAGNOSIS — C108 Malignant neoplasm of overlapping sites of oropharynx: Secondary | ICD-10-CM | POA: Diagnosis not present

## 2023-04-23 DIAGNOSIS — C770 Secondary and unspecified malignant neoplasm of lymph nodes of head, face and neck: Secondary | ICD-10-CM | POA: Diagnosis not present

## 2023-04-23 DIAGNOSIS — Z79899 Other long term (current) drug therapy: Secondary | ICD-10-CM | POA: Diagnosis not present

## 2023-04-23 DIAGNOSIS — K1230 Oral mucositis (ulcerative), unspecified: Secondary | ICD-10-CM | POA: Diagnosis not present

## 2023-04-23 DIAGNOSIS — R6889 Other general symptoms and signs: Secondary | ICD-10-CM | POA: Diagnosis not present

## 2023-04-23 DIAGNOSIS — L539 Erythematous condition, unspecified: Secondary | ICD-10-CM | POA: Diagnosis not present

## 2023-04-24 DIAGNOSIS — Z79899 Other long term (current) drug therapy: Secondary | ICD-10-CM | POA: Diagnosis not present

## 2023-04-24 DIAGNOSIS — Z8616 Personal history of COVID-19: Secondary | ICD-10-CM | POA: Diagnosis not present

## 2023-04-24 DIAGNOSIS — C108 Malignant neoplasm of overlapping sites of oropharynx: Secondary | ICD-10-CM | POA: Diagnosis not present

## 2023-04-24 DIAGNOSIS — Z931 Gastrostomy status: Secondary | ICD-10-CM | POA: Diagnosis not present

## 2023-04-24 DIAGNOSIS — L539 Erythematous condition, unspecified: Secondary | ICD-10-CM | POA: Diagnosis not present

## 2023-04-24 DIAGNOSIS — Z510 Encounter for antineoplastic radiation therapy: Secondary | ICD-10-CM | POA: Diagnosis not present

## 2023-04-24 DIAGNOSIS — C770 Secondary and unspecified malignant neoplasm of lymph nodes of head, face and neck: Secondary | ICD-10-CM | POA: Diagnosis not present

## 2023-04-24 DIAGNOSIS — R6889 Other general symptoms and signs: Secondary | ICD-10-CM | POA: Diagnosis not present

## 2023-04-24 DIAGNOSIS — K1230 Oral mucositis (ulcerative), unspecified: Secondary | ICD-10-CM | POA: Diagnosis not present

## 2023-04-27 ENCOUNTER — Inpatient Hospital Stay (HOSPITAL_BASED_OUTPATIENT_CLINIC_OR_DEPARTMENT_OTHER): Payer: Medicare HMO | Admitting: Oncology

## 2023-04-27 ENCOUNTER — Inpatient Hospital Stay: Payer: Medicare HMO

## 2023-04-27 ENCOUNTER — Inpatient Hospital Stay: Payer: Medicare HMO | Admitting: Dietician

## 2023-04-27 ENCOUNTER — Other Ambulatory Visit: Payer: Self-pay | Admitting: *Deleted

## 2023-04-27 VITALS — BP 93/53 | HR 70 | Temp 97.3°F | Resp 18

## 2023-04-27 DIAGNOSIS — U099 Post covid-19 condition, unspecified: Secondary | ICD-10-CM | POA: Diagnosis not present

## 2023-04-27 DIAGNOSIS — C109 Malignant neoplasm of oropharynx, unspecified: Secondary | ICD-10-CM

## 2023-04-27 DIAGNOSIS — Z609 Problem related to social environment, unspecified: Secondary | ICD-10-CM | POA: Diagnosis not present

## 2023-04-27 DIAGNOSIS — K1230 Oral mucositis (ulcerative), unspecified: Secondary | ICD-10-CM | POA: Diagnosis not present

## 2023-04-27 DIAGNOSIS — Z79899 Other long term (current) drug therapy: Secondary | ICD-10-CM | POA: Diagnosis not present

## 2023-04-27 DIAGNOSIS — G893 Neoplasm related pain (acute) (chronic): Secondary | ICD-10-CM | POA: Diagnosis not present

## 2023-04-27 DIAGNOSIS — R059 Cough, unspecified: Secondary | ICD-10-CM | POA: Diagnosis not present

## 2023-04-27 DIAGNOSIS — Z931 Gastrostomy status: Secondary | ICD-10-CM | POA: Diagnosis not present

## 2023-04-27 DIAGNOSIS — T85848D Pain due to other internal prosthetic devices, implants and grafts, subsequent encounter: Secondary | ICD-10-CM | POA: Diagnosis not present

## 2023-04-27 DIAGNOSIS — T85848A Pain due to other internal prosthetic devices, implants and grafts, initial encounter: Secondary | ICD-10-CM | POA: Diagnosis not present

## 2023-04-27 DIAGNOSIS — L539 Erythematous condition, unspecified: Secondary | ICD-10-CM | POA: Diagnosis not present

## 2023-04-27 DIAGNOSIS — F329 Major depressive disorder, single episode, unspecified: Secondary | ICD-10-CM

## 2023-04-27 DIAGNOSIS — Z5111 Encounter for antineoplastic chemotherapy: Secondary | ICD-10-CM | POA: Diagnosis not present

## 2023-04-27 DIAGNOSIS — Z8616 Personal history of COVID-19: Secondary | ICD-10-CM | POA: Diagnosis not present

## 2023-04-27 DIAGNOSIS — Z510 Encounter for antineoplastic radiation therapy: Secondary | ICD-10-CM | POA: Diagnosis not present

## 2023-04-27 DIAGNOSIS — J310 Chronic rhinitis: Secondary | ICD-10-CM | POA: Diagnosis not present

## 2023-04-27 DIAGNOSIS — R6889 Other general symptoms and signs: Secondary | ICD-10-CM | POA: Diagnosis not present

## 2023-04-27 DIAGNOSIS — C770 Secondary and unspecified malignant neoplasm of lymph nodes of head, face and neck: Secondary | ICD-10-CM | POA: Diagnosis not present

## 2023-04-27 DIAGNOSIS — C108 Malignant neoplasm of overlapping sites of oropharynx: Secondary | ICD-10-CM | POA: Diagnosis not present

## 2023-04-27 DIAGNOSIS — Z5919 Other inadequate housing: Secondary | ICD-10-CM | POA: Diagnosis not present

## 2023-04-27 DIAGNOSIS — E876 Hypokalemia: Secondary | ICD-10-CM

## 2023-04-27 DIAGNOSIS — F32A Depression, unspecified: Secondary | ICD-10-CM | POA: Diagnosis not present

## 2023-04-27 LAB — COMPREHENSIVE METABOLIC PANEL
ALT: 23 U/L (ref 0–44)
AST: 18 U/L (ref 15–41)
Albumin: 3 g/dL — ABNORMAL LOW (ref 3.5–5.0)
Alkaline Phosphatase: 77 U/L (ref 38–126)
Anion gap: 6 (ref 5–15)
BUN: 16 mg/dL (ref 8–23)
CO2: 29 mmol/L (ref 22–32)
Calcium: 9.1 mg/dL (ref 8.9–10.3)
Chloride: 98 mmol/L (ref 98–111)
Creatinine, Ser: 0.81 mg/dL (ref 0.61–1.24)
GFR, Estimated: >60 mL/min (ref >60)
Glucose, Bld: 165 mg/dL — ABNORMAL HIGH (ref 70–99)
Potassium: 3.4 mmol/L — ABNORMAL LOW (ref 3.5–5.1)
Sodium: 133 mmol/L — ABNORMAL LOW (ref 135–145)
Total Bilirubin: 0.5 mg/dL (ref <1.2)
Total Protein: 6.7 g/dL (ref 6.5–8.1)

## 2023-04-27 LAB — CBC WITH DIFFERENTIAL/PLATELET
Abs Immature Granulocytes: 0.01 10*3/uL (ref 0.00–0.07)
Basophils Absolute: 0 10*3/uL (ref 0.0–0.1)
Basophils Relative: 1 %
Eosinophils Absolute: 0.1 10*3/uL (ref 0.0–0.5)
Eosinophils Relative: 3 %
HCT: 36.6 % — ABNORMAL LOW (ref 39.0–52.0)
Hemoglobin: 12.5 g/dL — ABNORMAL LOW (ref 13.0–17.0)
Immature Granulocytes: 0 %
Lymphocytes Relative: 10 %
Lymphs Abs: 0.4 10*3/uL — ABNORMAL LOW (ref 0.7–4.0)
MCH: 30.3 pg (ref 26.0–34.0)
MCHC: 34.2 g/dL (ref 30.0–36.0)
MCV: 88.8 fL (ref 80.0–100.0)
Monocytes Absolute: 0.3 10*3/uL (ref 0.1–1.0)
Monocytes Relative: 9 %
Neutro Abs: 2.6 10*3/uL (ref 1.7–7.7)
Neutrophils Relative %: 77 %
Platelets: 157 10*3/uL (ref 150–400)
RBC: 4.12 MIL/uL — ABNORMAL LOW (ref 4.22–5.81)
RDW: 14 % (ref 11.5–15.5)
WBC: 3.4 10*3/uL — ABNORMAL LOW (ref 4.0–10.5)
nRBC: 0 % (ref 0.0–0.2)

## 2023-04-27 LAB — MAGNESIUM: Magnesium: 2.1 mg/dL (ref 1.7–2.4)

## 2023-04-27 MED ORDER — DEXAMETHASONE SODIUM PHOSPHATE 10 MG/ML IJ SOLN
10.0000 mg | Freq: Once | INTRAMUSCULAR | Status: AC
  Administered 2023-04-27: 10 mg via INTRAVENOUS
  Filled 2023-04-27: qty 1

## 2023-04-27 MED ORDER — SODIUM CHLORIDE 0.9 % IV SOLN: Freq: Once | INTRAVENOUS | Status: AC

## 2023-04-27 MED ORDER — BENZONATATE 100 MG PO CAPS: 100.0000 mg | ORAL_CAPSULE | Freq: Three times a day (TID) | ORAL | 0 refills | Status: DC | PRN

## 2023-04-27 MED ORDER — DIPHENHYDRAMINE HCL 50 MG/ML IJ SOLN
50.0000 mg | Freq: Once | INTRAMUSCULAR | Status: AC
  Administered 2023-04-27: 50 mg via INTRAVENOUS
  Filled 2023-04-27: qty 1

## 2023-04-27 MED ORDER — FAMOTIDINE IN NACL 20-0.9 MG/50ML-% IV SOLN
20.0000 mg | Freq: Once | INTRAVENOUS | Status: AC
  Administered 2023-04-27: 20 mg via INTRAVENOUS
  Filled 2023-04-27: qty 50

## 2023-04-27 MED ORDER — SODIUM CHLORIDE 0.9 % IV SOLN: 10.0000 mg | Freq: Once | INTRAVENOUS | Status: DC

## 2023-04-27 MED ORDER — HEPARIN SOD (PORK) LOCK FLUSH 100 UNIT/ML IV SOLN
500.0000 [IU] | Freq: Once | INTRAVENOUS | Status: AC | PRN
  Administered 2023-04-27: 500 [IU]

## 2023-04-27 MED ORDER — PALONOSETRON HCL INJECTION 0.25 MG/5ML
0.2500 mg | Freq: Once | INTRAVENOUS | Status: AC
  Administered 2023-04-27: 0.25 mg via INTRAVENOUS
  Filled 2023-04-27: qty 5

## 2023-04-27 MED ORDER — SODIUM CHLORIDE 0.9% FLUSH
10.0000 mL | INTRAVENOUS | Status: DC | PRN
Start: 2023-04-27 — End: 2023-04-27
  Administered 2023-04-27: 10 mL

## 2023-04-27 MED ORDER — SODIUM CHLORIDE 0.9 % IV SOLN
45.0000 mg/m2 | Freq: Once | INTRAVENOUS | Status: AC
  Administered 2023-04-27: 108 mg via INTRAVENOUS
  Filled 2023-04-27: qty 18

## 2023-04-27 MED ORDER — SODIUM CHLORIDE 0.9 % IV SOLN
270.0000 mg | Freq: Once | INTRAVENOUS | Status: AC
  Administered 2023-04-27: 270 mg via INTRAVENOUS
  Filled 2023-04-27: qty 27

## 2023-04-27 MED ORDER — POTASSIUM CHLORIDE CRYS ER 20 MEQ PO TBCR
40.0000 meq | EXTENDED_RELEASE_TABLET | Freq: Once | ORAL | Status: AC
  Administered 2023-04-27: 40 meq via ORAL
  Filled 2023-04-27: qty 2

## 2023-04-27 NOTE — Patient Instructions (Signed)
VISIT SUMMARY:  During today's visit, we discussed your ongoing chemotherapy treatment for oral cancer, your use of the PEG tube for nutrition, and some transportation issues affecting your radiation appointments. We also addressed your need for a refill of aspirin.  YOUR PLAN:  -ORAL CANCER: Oral cancer refers to cancer that develops in the tissues of the mouth or throat. You are currently undergoing chemotherapy, and there has been a subjective improvement in the size of your oral lesion. We will continue with the chemotherapy as planned and perform a physical examination of your oral cavity after the completion of chemotherapy to assess the response.  -PEG TUBE FEEDING: A PEG tube is a feeding tube placed through the skin and into the stomach to provide nutrition. You are currently using the PEG tube for feeding due to limited oral intake caused by mouth sores. Please continue with the PEG tube feedings as instructed.  -TRANSPORTATION ISSUES: You reported having transportation issues for your radiation appointments due to your truck breaking down. We will discuss this with the social work team to arrange transportation for your radiation appointments.   INSTRUCTIONS:  Please continue with your chemotherapy and PEG tube feedings as planned. We will arrange transportation for your radiation appointments through the social work team. Make sure to pick up your aspirin prescription refill. We will perform a physical examination of your oral cavity after the completion of radiation therapy to assess the response.

## 2023-04-27 NOTE — Assessment & Plan Note (Signed)
 Significantly improved.  No erythema around the PEG tube site. -Continue to monitor PEG tube site

## 2023-04-27 NOTE — Assessment & Plan Note (Signed)
 Persistent cough following recent COVID-19 infection. -Prescribe Tessalon perles for symptomatic relief.

## 2023-04-27 NOTE — Progress Notes (Signed)
Proceed with carboplatin dose 270 mg today with weight 103.2 kg and capped creatinine 1.  Pryor Ochoa, PharmD

## 2023-04-27 NOTE — Assessment & Plan Note (Signed)
-  Patient stated symptoms of depression on previous visits -Feeling improved admitted today

## 2023-04-27 NOTE — Progress Notes (Signed)
Patient presents today for chemotherapy infusion. Patient is in satisfactory condition with no new complaints voiced.  Vital signs are stable.  Labs reviewed by Dr. Anders Simmonds during the office visit and all labs are within treatment parameters.  Potassium today is 3.4.  We will give Klor Con 40 mEq PO x one dose today per standing orders by Dr. Ellin Saba.  We will proceed with treatment per MD orders.   Patient tolerated treatment well with no complaints voiced.  Patient left via wheelchair in stable condition.  Vital signs stable at discharge.  Patient will be transported home by [facility] transportation.  Follow up as scheduled.

## 2023-04-27 NOTE — Assessment & Plan Note (Signed)
 Does not report pain today and able to eat soft food -Continue Advil as needed

## 2023-04-27 NOTE — Addendum Note (Signed)
Encounter addended by: Edward Qualia on: 04/27/2023 10:35 AM  Actions taken: Imaging Exam ended

## 2023-04-27 NOTE — Assessment & Plan Note (Addendum)
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 Undergoing radiation therapy with associated dysphagia. Patient is able to consume soft foods orally and is using a PEG tube for additional nutrition. -PET scan showed no evidence of metastatic disease -S/P PEG tube and port placement -Patient tolerating chemo RT C5D1 today. Baseline labs stable.  Held cycle 4 for pneumonia and PEG tube site infection. -Continue current treatment plan. -Continue to encourage oral intake as tolerated. -Repeat PET scan at 12 weeks after completing chemo RT  RTC in 1 week

## 2023-04-27 NOTE — Assessment & Plan Note (Signed)
Patient lives in a house with bedbug infestation.  Reported transportation problems today. -Will coordinate with social worker for regular transportation to chemotherapy and also radiation therapy

## 2023-04-27 NOTE — Progress Notes (Signed)
Patient Care Team: Eustaquio Boyden, MD as PCP - General (Family Medicine) Julio Sicks, MD as Consulting Physician (Neurosurgery) Jethro Bolus, MD (Inactive) as Consulting Physician (Urology) Kathyrn Sheriff, [facility] (Inactive) as Pharmacist (Pharmacist) [place], [place], [place] as Social Worker Cindie Crumbly, MD as Medical Oncologist (Medical Oncology) Therese Sarah, RN as Oncology Nurse Navigator (Medical Oncology) Juanell Fairly, RN as Case Manager  Clinic Day:  04/27/2023  Referring physician: Eustaquio Boyden, MD   CHIEF COMPLAINT:  CC: Oropharyngeal squamous cell carcinoma    ASSESSMENT & PLAN:   Assessment & Plan: Gregory Fisher  is a 72 y.o. 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 Undergoing radiation therapy with associated dysphagia. Patient is able to consume soft foods orally and is using a PEG tube for additional nutrition. -PET scan showed no evidence of metastatic disease -S/P PEG tube and port placement -Patient tolerating chemo RT C5D1 today. Baseline labs stable.  Held cycle 4 for pneumonia and PEG tube site infection. -Continue current treatment plan. -Continue to encourage oral intake as tolerated. -Repeat PET scan at 12 weeks after completing chemo RT  RTC in 1 week   Depression -Patient stated symptoms of depression on previous visits -Feeling improved admitted today   Post-COVID-19 syndrome Persistent cough following recent COVID-19 infection. -Prescribe Tessalon perles for symptomatic relief.   Neoplasm related pain Does not report pain today and able to eat soft food -Continue Advil as needed   Pain around PEG tube site Significantly improved.  No erythema around the PEG tube site. -Continue to monitor PEG tube site   Poor social  situation Patient lives in a house with bedbug infestation.  Reported transportation problems today. -Will coordinate with social worker for regular transportation to chemotherapy and also radiation therapy  The patient understands the plans discussed today and is in agreement with them.  He knows to contact our office if he develops concerns prior to his next appointment.  I provided 20 minutes of face-to-face time during this encounter and > 50% was spent counseling as documented under my assessment and plan.    Cindie Crumbly, MD  [facility] [address] [place] [address] Dept: [phone] Dept Fax: [phone]    ONCOLOGY HISTORY:   Oncology History Overview Note  T4N2Mx Squamous Cell Carcinoma of oro pharynx   Squamous cell carcinoma of oropharynx (HCC)  01/28/2023 Imaging   CT neck:  Pharynx and larynx: Enhancing mass in the right aspect of the pharynx, which measures approximately 3.3 x 3.1 x 5.0 cm. The mass appears centered in the oropharynx near the right palatine tonsil, but extends into the hypopharynx more than into the nasopharynx. The larynx is unremarkable.  Lymph nodes: Enlarged, abnormal lymph nodes bilaterally. Representative lymph nodes include a left level 2A lymph node with central low density, which measures 2.1 x 2.5 x 3.4 cm . A right level 2A lymph node also demonstrates central low density and measures 1.8 x 1.3 x 2.4 cm. A rounded right level 1B lymph node measures up to 1.1 cm (series 3, image 41), with some central low density. Abnormally enhancing right level 3 lymph node measures 1.3 x 1.9 x 2.2 cm.    01/29/2023 Pathology Results   A. OROPHARYNGEAL MASS INVOLVING TONSIL AND RIGHT PHARYNGEAL WALL, RIGHT, BIOPSY:  Invasive squamous cell carcinoma, moderately differentiated.  P16 positive, HPV associated         01/29/2023 Procedure   Fiberoptic laryngoscopy with biopsy    01/29/2023  Tumor Marker   CARIS:  CPS: 10 Positive: p16, FGFR3 TMB low: 53mut/Mb NTRK1/2/3: Negative MSI: Stable   02/26/2023 Initial Diagnosis   Oropharyngeal carcinoma (HCC)   03/12/2023 PET scan   IMPRESSION: 1. The right base of tongue and pharyngeal mass is intensely FDG avid compatible with primary head and neck cancer. 2. There is additional tracer uptake within the left lateral wall of the oropharynx. This is nonspecific and may be inflammatory or neoplastic in etiology.  3. Bilateral tracer avid cervical lymph nodes are identified compatible with nodal metastasis. 4. There is a 6 mm nodule within the left midlung abutting the oblique fissure which demonstrates low level FDG uptake. Technically too small to reliably characterize by PET-CT. Attention on follow-up imaging advised. 5. Umbilical hernia contains nonobstructed loops of small bowel. 6. Coronary artery atherosclerotic calcifications.   03/18/2023 Procedure   IR guided port placement IR guided PEG tube placement   03/23/2023 -  Chemotherapy   Patient is on Treatment Plan : HEAD/NECK Carboplatin + Paclitaxel + XRT q7d     03/23/2023 -  Radiation Therapy   Started on radiation       Current Treatment: Chemo RT with carboplatin plus paclitaxel  INTERVAL HISTORY:  Discussed the use of AI scribe software for clinical note transcription with the patient, who gave verbal consent to proceed.   Gregory Fisher is here today for follow up. He is currently undergoing chemotherapy with radiation and has a PEG tube for nutrition. He reports a persistent cough, which has improved. He also reports difficulty eating due to mouth sores, likely secondary to chemotherapy. Despite these issues, he feels his condition has improved compared to previous weeks. He is able to manage feedings through the PEG tube but may need more in the future. He also has transportation issues, as his truck has broken down, which may affect his ability to attend  radiation treatments.  I have reviewed the past medical history, past surgical history, social history and family history with the patient and they are unchanged from previous note.  ALLERGIES:  has No Known Allergies.  MEDICATIONS:  Current Outpatient Medications  Medication Sig Dispense Refill   Accu-Chek Softclix Lancets lancets Use as instructed to check blood sugar 2 (times) a day 200 each 3   atorvastatin (LIPITOR) 80 MG tablet Take 1 tablet (80 mg total) by mouth daily. 100 tablet 0   Blood Glucose Monitoring Suppl (ACCU-CHEK GUIDE ME) w/Device KIT Use as instructed to check blood sugar 2 (times) a day 1 kit 0   CARBOPLATIN IV Inject into the vein once a week.     carvedilol (COREG) 12.5 MG tablet Take 1 tablet (12.5 mg total) by mouth 2 (two) times daily with a meal. 200 tablet 0   Cholecalciferol (VITAMIN D3) 25 MCG (1000 UT) CAPS Take 2 capsules (2,000 Units total) by mouth daily. 30 capsule    cloNIDine (CATAPRES) 0.1 MG tablet Take 1 tablet (0.1 mg total) by mouth 2 (two) times daily. 200 tablet 0   dapagliflozin propanediol (FARXIGA) 5 MG TABS tablet Take 1 tablet (5 mg total) by mouth daily before breakfast. 90 tablet 3   dexamethasone (DECADRON) 4 MG tablet Take 2 tablets daily for 2 days, start the day after chemotherapy. Take with food. 30 tablet 1   furosemide (LASIX) 40 MG tablet Take 1  tablet (40 mg total) by mouth daily. 90 tablet 0   glucose blood (ACCU-CHEK GUIDE) test strip Use as instructed to check blood sugar 2 (times) a day 200 strip 3   lidocaine-prilocaine (EMLA) cream Apply a quarter-sized amount to port a cath site and cover with plastic wrap 1 hour prior to infusion appointments 30 g 3   LORazepam (ATIVAN) 1 MG tablet TAKE 1 TABLET BY MOUTH EVERY DAY AS NEEDED FOR ANXIETY 20 tablet 0   metFORMIN (GLUCOPHAGE) 500 MG tablet Take 1 tablet (500 mg total) by mouth 2 (two) times daily with a meal. 200 tablet 0   PACLitaxel (TAXOL IV) Inject into the vein once a week.      potassium chloride (KLOR-CON M10) 10 MEQ tablet Take 1 tablet (10 mEq total) by mouth 2 (two) times daily. 180 tablet 0   senna (SENOKOT) 8.6 MG TABS tablet Take 1 tablet (8.6 mg total) by mouth at bedtime. 120 tablet 0   sodium chloride (OCEAN) 0.65 % SOLN nasal spray Place 1 spray into both nostrils as needed. 30 mL 5   sucralfate (CARAFATE) 1 GM/10ML suspension Take 10 mLs (1 g total) by mouth 4 (four) times daily -  with meals and at bedtime. 420 mL 0   tamsulosin (FLOMAX) 0.4 MG CAPS capsule Take 2 capsules (0.8 mg total) by mouth at bedtime. 200 capsule 0   valsartan (DIOVAN) 320 MG tablet Take 0.5 tablets (160 mg total) by mouth daily. 45 tablet 0   benzonatate (TESSALON) 100 MG capsule Take 1 capsule (100 mg total) by mouth 3 (three) times daily as needed for cough. 20 capsule 0   ondansetron (ZOFRAN) 8 MG tablet Take 1 tablet (8 mg total) by mouth every 8 (eight) hours as needed for nausea or vomiting. Start on the third day after chemotherapy. 30 tablet 1   No current facility-administered medications for this visit.   Facility-Administered Medications Ordered in Other Visits  Medication Dose Route Frequency Provider Last Rate Last Admin   sodium chloride flush (NS) 0.9 % injection 10 mL  10 mL Intracatheter PRN Cindie Crumbly, MD   10 mL at 04/27/23 1346     REVIEW OF SYSTEMS:   Constitutional: Denies fevers, chills or abnormal weight loss Eyes: Denies blurriness of vision Ears, nose, mouth, throat, and face: Denies mucositis or sore throat Respiratory: Denies cough, dyspnea or wheezes Cardiovascular: Denies palpitation, chest discomfort or lower extremity swelling Gastrointestinal:  Denies nausea, heartburn or change in bowel habits Skin: Denies abnormal skin rashes Lymphatics: Denies new lymphadenopathy or easy bruising Neurological:Denies numbness, tingling or new weaknesses Behavioral/Psych: Mood is stable, no new changes  All other systems were reviewed with the  patient and are negative.   VITALS:  There were no vitals taken for this visit.  Wt Readings from Last 3 Encounters:  04/27/23 227 lb 9.6 oz (103.2 kg)  04/20/23 235 lb 3.2 oz (106.7 kg)  04/13/23 235 lb (106.6 kg)     Performance status (ECOG):  2 - Symptomatic, <50% confined to bed   PHYSICAL EXAM:   GENERAL:alert, no distress and comfortable, patient is unkempt  OROPHARYNX: Palpable hard mass in the right side at least 5 cm x 3cms, nontender, no discharge, no ulceration noted.   NECK: supple, thyroid normal size, non-tender, Palpable lymph node 1 cm in the right IIa  location, non tender, immobile. LYMPH:  no palpable lymphadenopathy in tother cervical, axillary or inguinal LUNGS: clear to auscultation and percussion with normal breathing effort,  port in place- no erythema or discharge HEART: regular rate & rhythm and no murmurs and no lower extremity edema ABDOMEN:abdomen soft, non-tender and normal bowel sounds, PEG tube in place with dressing, no erythema around PEG tube site, hernia present Musculoskeletal:no cyanosis of digits and no clubbing  PSYCH: alert & oriented x 3 with fluent speech NEURO: no focal motor/sensory deficits  LABORATORY DATA:  I have reviewed the data as listed     Component Value Date/Time   NA 133 (L) 04/27/2023 0802   NA 142 02/05/2012 0000   K 3.4 (L) 04/27/2023 0802   K 4.0 02/05/2012 0000   CL 98 04/27/2023 0802   CO2 29 04/27/2023 0802   GLUCOSE 165 (H) 04/27/2023 0802   BUN 16 04/27/2023 0802   CREATININE 0.81 04/27/2023 0802   CREATININE 1.0 02/05/2012 0000   CALCIUM 9.1 04/27/2023 0802   CALCIUM 10.2 02/05/2012 0000   PROT 6.7 04/27/2023 0802   ALBUMIN 3.0 (L) 04/27/2023 0802   AST 18 04/27/2023 0802   AST 21 02/05/2012 0000   ALT 23 04/27/2023 0802   ALKPHOS 77 04/27/2023 0802   ALKPHOS 62 02/05/2012 0000   BILITOT 0.5 04/27/2023 0802   BILITOT 0.7 02/05/2012 0000   GFRNONAA >60 04/27/2023 0802   GFRAA >60 11/21/2016 1100     Lab Results  Component Value Date   WBC 3.4 (L) 04/27/2023   NEUTROABS 2.6 04/27/2023   HGB 12.5 (L) 04/27/2023   HCT 36.6 (L) 04/27/2023   MCV 88.8 04/27/2023   PLT 157 04/27/2023      Chemistry      Component Value Date/Time   NA 133 (L) 04/27/2023 0802   NA 142 02/05/2012 0000   K 3.4 (L) 04/27/2023 0802   K 4.0 02/05/2012 0000   CL 98 04/27/2023 0802   CO2 29 04/27/2023 0802   BUN 16 04/27/2023 0802   CREATININE 0.81 04/27/2023 0802   CREATININE 1.0 02/05/2012 0000      Component Value Date/Time   CALCIUM 9.1 04/27/2023 0802   CALCIUM 10.2 02/05/2012 0000   ALKPHOS 77 04/27/2023 0802   ALKPHOS 62 02/05/2012 0000   AST 18 04/27/2023 0802   AST 21 02/05/2012 0000   ALT 23 04/27/2023 0802   BILITOT 0.5 04/27/2023 0802   BILITOT 0.7 02/05/2012 0000

## 2023-04-27 NOTE — Progress Notes (Signed)
Nutrition Follow-up:  Patient with oropharyngeal cancer. He is receiving concurrent chemoradiation with weekly carboplatin + paclitaxel  (start 10/14). Patient is under the care of Dr. Anders Simmonds.   PEG - 10/9  Met with patient in infusion. He reports ongoing cold. Patient coughing up secretions and spitting into emesis bag during visit. He says it is some nasty stuff and asked RD if I wanted to look at it. Patient endorses odynophagia with oral intake, but is eating and drinking by mouth a couple times daily. Recalls 44 ounce tub of vanilla ice cream yesterday. Patient had cup of applesauce this morning. Patient requesting bag of food. He reports using his tube for supplemental nutrition. He is giving 2-3 cartons Osmolite/day. States he "did 2 this morning." He is requesting additional formula. Patient is flushing tube with water. He is unsure of how much. "I just pour it in." He denies nausea, vomiting, diarrhea, constipation.    Medications: Tessalon (11/18)  Labs: glucose 165, Na 133, K 3.4, albumin 3.0  Anthropometrics: Wt 227 lb 9.6 oz today - decreased 3% in one week - severe for time frame  11/11 - 235 lb 3.2 oz  11/4 - 239 lb 9.6 oz  Estimated Energy Needs   Kcals: 2500-2800 Protein: 135-155 Fluid: >2.5 L   NUTRITION DIAGNOSIS: Inadequate oral intake -addressing with TF   INTERVENTION:  Ongoing PEG care and bolus feeding education. Recommend increasing tube feedings to goal as tolerated. This was reviewed with patient  Encouraged oral intake of soft moist foods high in calories and protein as tolerated [employer] 1.4 samples provided - encouraged 5-6 cartons daily  One bag food from [facility] pantry provided     MONITORING, EVALUATION, GOAL: wt trends, intake, TF   NEXT VISIT: Monday November 25 during infusion

## 2023-04-27 NOTE — Patient Instructions (Signed)
[facility] - [facility] DEPT OF [facility]  Discharge Instructions: Thank you for choosing [facility] to provide your oncology and hematology care.  If you have a lab appointment with the Cancer Center - please note that after April 8th, 2024, all labs will be drawn in the cancer center.  You do not have to check in or register with the main entrance as you have in the past but will complete your check-in in the cancer center.  Wear comfortable clothing and clothing appropriate for easy access to any Portacath or PICC line.   We strive to give you quality time with your provider. You may need to reschedule your appointment if you arrive late (15 or more minutes).  Arriving late affects you and other patients whose appointments are after yours.  Also, if you miss three or more appointments without notifying the office, you may be dismissed from the clinic at the provider's discretion.      For prescription refill requests, have your pharmacy contact our office and allow 72 hours for refills to be completed.    Today you received the following chemotherapy and/or immunotherapy agents Taxol/Carboplatin.  Paclitaxel Injection What is this medication? PACLITAXEL (PAK li TAX el) treats some types of cancer. It works by slowing down the growth of cancer cells. This medicine may be used for other purposes; ask your health care provider or pharmacist if you have questions. COMMON BRAND NAME(S): Onxol, Taxol What should I tell my care team before I take this medication? They need to know if you have any of these conditions: Heart disease Liver disease Low white blood cell levels An unusual or allergic reaction to paclitaxel, other medications, foods, dyes, or preservatives If you or your partner are pregnant or trying to get pregnant Breast-feeding How should I use this medication? This medication is injected into a vein. It is given by your care team in a  hospital or clinic setting. Talk to your care team about the use of this medication in children. While it may be given to children for selected conditions, precautions do apply. Overdosage: If you think you have taken too much of this medicine contact a poison control center or emergency room at once. NOTE: This medicine is only for you. Do not share this medicine with others. What if I miss a dose? Keep appointments for follow-up doses. It is important not to miss your dose. Call your care team if you are unable to keep an appointment. What may interact with this medication? Do not take this medication with any of the following: Live virus vaccines Other medications may affect the way this medication works. Talk with your care team about all of the medications you take. They may suggest changes to your treatment plan to lower the risk of side effects and to make sure your medications work as intended. This list may not describe all possible interactions. Give your health care provider a list of all the medicines, herbs, non-prescription drugs, or dietary supplements you use. Also tell them if you smoke, drink alcohol, or use illegal drugs. Some items may interact with your medicine. What should I watch for while using this medication? Your condition will be monitored carefully while you are receiving this medication. You may need blood work while taking this medication. This medication may make you feel generally unwell. This is not uncommon as chemotherapy can affect healthy cells as well as cancer cells. Report any side  effects. Continue your course of treatment even though you feel ill unless your care team tells you to stop. This medication can cause serious allergic reactions. To reduce the risk, your care team may give you other medications to take before receiving this one. Be sure to follow the directions from your care team. This medication may increase your risk of getting an infection.  Call your care team for advice if you get a fever, chills, sore throat, or other symptoms of a cold or flu. Do not treat yourself. Try to avoid being around people who are sick. This medication may increase your risk to bruise or bleed. Call your care team if you notice any unusual bleeding. Be careful brushing or flossing your teeth or using a toothpick because you may get an infection or bleed more easily. If you have any dental work done, tell your dentist you are receiving this medication. Talk to your care team if you may be pregnant. Serious birth defects can occur if you take this medication during pregnancy. Talk to your care team before breastfeeding. Changes to your treatment plan may be needed. What side effects may I notice from receiving this medication? Side effects that you should report to your care team as soon as possible: Allergic reactions--skin rash, itching, hives, swelling of the face, lips, tongue, or throat Heart rhythm changes--fast or irregular heartbeat, dizziness, feeling faint or lightheaded, chest pain, trouble breathing Increase in blood pressure Infection--fever, chills, cough, sore throat, wounds that don't heal, pain or trouble when passing urine, general feeling of discomfort or being unwell Low blood pressure--dizziness, feeling faint or lightheaded, blurry vision Low red blood cell level--unusual weakness or fatigue, dizziness, headache, trouble breathing Painful swelling, warmth, or redness of the skin, blisters or sores at the infusion site Pain, tingling, or numbness in the hands or feet Slow heartbeat--dizziness, feeling faint or lightheaded, confusion, trouble breathing, unusual weakness or fatigue Unusual bruising or bleeding Side effects that usually do not require medical attention (report to your care team if they continue or are bothersome): Diarrhea Hair loss Joint pain Loss of appetite Muscle pain Nausea Vomiting This list may not describe  all possible side effects. Call your doctor for medical advice about side effects. You may report side effects to FDA at 1-800-FDA-1088. Where should I keep my medication? This medication is given in a hospital or clinic. It will not be stored at home. NOTE: This sheet is a summary. It may not cover all possible information. If you have questions about this medicine, talk to your doctor, pharmacist, or health care provider.  2024 Elsevier/Gold Standard (2021-10-15 00:00:00)    Carboplatin Injection What is this medication? CARBOPLATIN (KAR boe pla tin) treats some types of cancer. It works by slowing down the growth of cancer cells. This medicine may be used for other purposes; ask your health care provider or pharmacist if you have questions. COMMON BRAND NAME(S): Paraplatin What should I tell my care team before I take this medication? They need to know if you have any of these conditions: Blood disorders Hearing problems Kidney disease Recent or ongoing radiation therapy An unusual or allergic reaction to carboplatin, cisplatin, other medications, foods, dyes, or preservatives Pregnant or trying to get pregnant Breast-feeding How should I use this medication? This medication is injected into a vein. It is given by your care team in a hospital or clinic setting. Talk to your care team about the use of this medication in children. Special care may be  needed. Overdosage: If you think you have taken too much of this medicine contact a poison control center or emergency room at once. NOTE: This medicine is only for you. Do not share this medicine with others. What if I miss a dose? Keep appointments for follow-up doses. It is important not to miss your dose. Call your care team if you are unable to keep an appointment. What may interact with this medication? Medications for seizures Some antibiotics, such as amikacin, gentamicin, neomycin, streptomycin, tobramycin Vaccines This list  may not describe all possible interactions. Give your health care provider a list of all the medicines, herbs, non-prescription drugs, or dietary supplements you use. Also tell them if you smoke, drink alcohol, or use illegal drugs. Some items may interact with your medicine. What should I watch for while using this medication? Your condition will be monitored carefully while you are receiving this medication. You may need blood work while taking this medication. This medication may make you feel generally unwell. This is not uncommon, as chemotherapy can affect healthy cells as well as cancer cells. Report any side effects. Continue your course of treatment even though you feel ill unless your care team tells you to stop. In some cases, you may be given additional medications to help with side effects. Follow all directions for their use. This medication may increase your risk of getting an infection. Call your care team for advice if you get a fever, chills, sore throat, or other symptoms of a cold or flu. Do not treat yourself. Try to avoid being around people who are sick. Avoid taking medications that contain aspirin, acetaminophen, ibuprofen, naproxen, or ketoprofen unless instructed by your care team. These medications may hide a fever. Be careful brushing or flossing your teeth or using a toothpick because you may get an infection or bleed more easily. If you have any dental work done, tell your dentist you are receiving this medication. Talk to your care team if you wish to become pregnant or think you might be pregnant. This medication can cause serious birth defects. Talk to your care team about effective forms of contraception. Do not breast-feed while taking this medication. What side effects may I notice from receiving this medication? Side effects that you should report to your care team as soon as possible: Allergic reactions--skin rash, itching, hives, swelling of the face, lips, tongue,  or throat Infection--fever, chills, cough, sore throat, wounds that don't heal, pain or trouble when passing urine, general feeling of discomfort or being unwell Low red blood cell level--unusual weakness or fatigue, dizziness, headache, trouble breathing Pain, tingling, or numbness in the hands or feet, muscle weakness, change in vision, confusion or trouble speaking, loss of balance or coordination, trouble walking, seizures Unusual bruising or bleeding Side effects that usually do not require medical attention (report to your care team if they continue or are bothersome): Hair loss Nausea Unusual weakness or fatigue Vomiting This list may not describe all possible side effects. Call your doctor for medical advice about side effects. You may report side effects to FDA at 1-800-FDA-1088. Where should I keep my medication? This medication is given in a hospital or clinic. It will not be stored at home. NOTE: This sheet is a summary. It may not cover all possible information. If you have questions about this medicine, talk to your doctor, pharmacist, or health care provider.  2024 Elsevier/Gold Standard (2021-09-17 00:00:00)        To help prevent nausea and  vomiting after your treatment, we encourage you to take your nausea medication as directed.  BELOW ARE SYMPTOMS THAT SHOULD BE REPORTED IMMEDIATELY: *FEVER GREATER THAN 100.4 F (38 C) OR HIGHER *CHILLS OR SWEATING *NAUSEA AND VOMITING THAT IS NOT CONTROLLED WITH YOUR NAUSEA MEDICATION *UNUSUAL SHORTNESS OF BREATH *UNUSUAL BRUISING OR BLEEDING *URINARY PROBLEMS (pain or burning when urinating, or frequent urination) *BOWEL PROBLEMS (unusual diarrhea, constipation, pain near the anus) TENDERNESS IN MOUTH AND THROAT WITH OR WITHOUT PRESENCE OF ULCERS (sore throat, sores in mouth, or a toothache) UNUSUAL RASH, SWELLING OR PAIN  UNUSUAL VAGINAL DISCHARGE OR ITCHING   Items with * indicate a potential emergency and should be followed  up as soon as possible or go to the Emergency Department if any problems should occur.  Please show the CHEMOTHERAPY ALERT CARD or IMMUNOTHERAPY ALERT CARD at check-in to the Emergency Department and triage nurse.  Should you have questions after your visit or need to cancel or reschedule your appointment, please contact [facility] - A DEPT OF [facility] [phone]  and follow the prompts.  Office hours are 8:00 a.m. to 4:30 p.m. Monday - Friday. Please note that voicemails left after 4:00 p.m. may not be returned until the following business day.  We are closed weekends and major holidays. You have access to a nurse at all times for urgent questions. Please call the main number to the clinic [phone] and follow the prompts.  For any non-urgent questions, you may also contact your provider using MyChart. We now offer e-Visits for anyone 57 and older to request care online for non-urgent symptoms. For details visit [email or web address].   Also download the MyChart app! Go to the app store, search "MyChart", open the app, select , and log in with your MyChart username and password.

## 2023-04-28 ENCOUNTER — Telehealth: Payer: Self-pay

## 2023-04-28 DIAGNOSIS — C108 Malignant neoplasm of overlapping sites of oropharynx: Secondary | ICD-10-CM | POA: Diagnosis not present

## 2023-04-28 DIAGNOSIS — L539 Erythematous condition, unspecified: Secondary | ICD-10-CM | POA: Diagnosis not present

## 2023-04-28 DIAGNOSIS — Z931 Gastrostomy status: Secondary | ICD-10-CM | POA: Diagnosis not present

## 2023-04-28 DIAGNOSIS — Z510 Encounter for antineoplastic radiation therapy: Secondary | ICD-10-CM | POA: Diagnosis not present

## 2023-04-28 DIAGNOSIS — K1230 Oral mucositis (ulcerative), unspecified: Secondary | ICD-10-CM | POA: Diagnosis not present

## 2023-04-28 DIAGNOSIS — R6889 Other general symptoms and signs: Secondary | ICD-10-CM | POA: Diagnosis not present

## 2023-04-28 DIAGNOSIS — Z79899 Other long term (current) drug therapy: Secondary | ICD-10-CM | POA: Diagnosis not present

## 2023-04-28 DIAGNOSIS — Z8616 Personal history of COVID-19: Secondary | ICD-10-CM | POA: Diagnosis not present

## 2023-04-28 DIAGNOSIS — C770 Secondary and unspecified malignant neoplasm of lymph nodes of head, face and neck: Secondary | ICD-10-CM | POA: Diagnosis not present

## 2023-04-28 NOTE — Telephone Encounter (Signed)
Transition Care Management Follow-up Telephone Call Date of discharge and from where: [facility] How have you been since you were released from the hospital? Patient is following up with provider Any questions or concerns? No  Items Reviewed: Did the pt receive and understand the discharge instructions provided? Yes  Medications obtained and verified? Yes  Other? No  Any new allergies since your discharge? No  Dietary orders reviewed? No Do you have support at home? Yes    Follow up appointments reviewed:  PCP Hospital f/u appt confirmed? Yes  Scheduled to see  on  @ . Specialist Hospital f/u appt confirmed? Yes  Scheduled to see  on  @ . Are transportation arrangements needed?No If their condition worsens, is the pt aware to call PCP or go to the Emergency Dept.? Yes Was the patient provided with contact information for the PCP's office or ED? Yes Was to pt encouraged to call back with questions or concerns? Yes

## 2023-04-29 DIAGNOSIS — C108 Malignant neoplasm of overlapping sites of oropharynx: Secondary | ICD-10-CM | POA: Diagnosis not present

## 2023-04-29 DIAGNOSIS — C770 Secondary and unspecified malignant neoplasm of lymph nodes of head, face and neck: Secondary | ICD-10-CM | POA: Diagnosis not present

## 2023-04-29 DIAGNOSIS — Z8616 Personal history of COVID-19: Secondary | ICD-10-CM | POA: Diagnosis not present

## 2023-04-29 DIAGNOSIS — L539 Erythematous condition, unspecified: Secondary | ICD-10-CM | POA: Diagnosis not present

## 2023-04-29 DIAGNOSIS — K1230 Oral mucositis (ulcerative), unspecified: Secondary | ICD-10-CM | POA: Diagnosis not present

## 2023-04-29 DIAGNOSIS — Z79899 Other long term (current) drug therapy: Secondary | ICD-10-CM | POA: Diagnosis not present

## 2023-04-29 DIAGNOSIS — Z931 Gastrostomy status: Secondary | ICD-10-CM | POA: Diagnosis not present

## 2023-04-29 DIAGNOSIS — R6889 Other general symptoms and signs: Secondary | ICD-10-CM | POA: Diagnosis not present

## 2023-04-29 DIAGNOSIS — Z510 Encounter for antineoplastic radiation therapy: Secondary | ICD-10-CM | POA: Diagnosis not present

## 2023-04-30 ENCOUNTER — Inpatient Hospital Stay: Payer: Medicare HMO | Admitting: Licensed Clinical Social Worker

## 2023-04-30 DIAGNOSIS — Z79899 Other long term (current) drug therapy: Secondary | ICD-10-CM | POA: Diagnosis not present

## 2023-04-30 DIAGNOSIS — Z931 Gastrostomy status: Secondary | ICD-10-CM | POA: Diagnosis not present

## 2023-04-30 DIAGNOSIS — Z8616 Personal history of COVID-19: Secondary | ICD-10-CM | POA: Diagnosis not present

## 2023-04-30 DIAGNOSIS — K1230 Oral mucositis (ulcerative), unspecified: Secondary | ICD-10-CM | POA: Diagnosis not present

## 2023-04-30 DIAGNOSIS — Z510 Encounter for antineoplastic radiation therapy: Secondary | ICD-10-CM | POA: Diagnosis not present

## 2023-04-30 DIAGNOSIS — C770 Secondary and unspecified malignant neoplasm of lymph nodes of head, face and neck: Secondary | ICD-10-CM | POA: Diagnosis not present

## 2023-04-30 DIAGNOSIS — L539 Erythematous condition, unspecified: Secondary | ICD-10-CM | POA: Diagnosis not present

## 2023-04-30 DIAGNOSIS — R6889 Other general symptoms and signs: Secondary | ICD-10-CM | POA: Diagnosis not present

## 2023-04-30 DIAGNOSIS — C108 Malignant neoplasm of overlapping sites of oropharynx: Secondary | ICD-10-CM | POA: Diagnosis not present

## 2023-04-30 DIAGNOSIS — C109 Malignant neoplasm of oropharynx, unspecified: Secondary | ICD-10-CM

## 2023-04-30 NOTE — Progress Notes (Signed)
[facility] CSW Progress Note  Clinical Child psychotherapist  received a call from [place] [phone]) from [employer] who reported she was outside of pt's home for scheduled appointment to inspect the home for treatment of bed bugs.  Per Carlena Sax there were 2 unsecured dogs in the yard and she could not leave her vehicle.  CSW reached pt's room mate Joe by phone and requested he step out to secure the dogs for [employer] to which he verbalized agreement and ended the call.  CSW received another call from [place] and was informed that pt had met her in the yard and refused to allow her to enter the home stating he had not had the opportunity to clean the house prior to her arrival and it was not a good time for the inspection to occur.  CSW unable to reach either Joe or pt by phone following this interaction.  Medical team informed of the above.        Rachel Moulds, LCSW Clinical Social Worker [facility]

## 2023-05-01 DIAGNOSIS — C108 Malignant neoplasm of overlapping sites of oropharynx: Secondary | ICD-10-CM | POA: Diagnosis not present

## 2023-05-01 DIAGNOSIS — Z931 Gastrostomy status: Secondary | ICD-10-CM | POA: Diagnosis not present

## 2023-05-01 DIAGNOSIS — R6889 Other general symptoms and signs: Secondary | ICD-10-CM | POA: Diagnosis not present

## 2023-05-01 DIAGNOSIS — Z79899 Other long term (current) drug therapy: Secondary | ICD-10-CM | POA: Diagnosis not present

## 2023-05-01 DIAGNOSIS — L539 Erythematous condition, unspecified: Secondary | ICD-10-CM | POA: Diagnosis not present

## 2023-05-01 DIAGNOSIS — Z510 Encounter for antineoplastic radiation therapy: Secondary | ICD-10-CM | POA: Diagnosis not present

## 2023-05-01 DIAGNOSIS — K1230 Oral mucositis (ulcerative), unspecified: Secondary | ICD-10-CM | POA: Diagnosis not present

## 2023-05-01 DIAGNOSIS — C770 Secondary and unspecified malignant neoplasm of lymph nodes of head, face and neck: Secondary | ICD-10-CM | POA: Diagnosis not present

## 2023-05-01 DIAGNOSIS — Z8616 Personal history of COVID-19: Secondary | ICD-10-CM | POA: Diagnosis not present

## 2023-05-04 ENCOUNTER — Inpatient Hospital Stay: Payer: Medicare HMO

## 2023-05-04 ENCOUNTER — Telehealth: Payer: Self-pay

## 2023-05-04 ENCOUNTER — Inpatient Hospital Stay (HOSPITAL_BASED_OUTPATIENT_CLINIC_OR_DEPARTMENT_OTHER): Payer: Medicare HMO | Admitting: Oncology

## 2023-05-04 ENCOUNTER — Inpatient Hospital Stay: Payer: Medicare HMO | Admitting: Dietician

## 2023-05-04 VITALS — BP 122/74 | HR 78 | Temp 99.8°F | Resp 20

## 2023-05-04 DIAGNOSIS — F32A Depression, unspecified: Secondary | ICD-10-CM | POA: Diagnosis not present

## 2023-05-04 DIAGNOSIS — T85848A Pain due to other internal prosthetic devices, implants and grafts, initial encounter: Secondary | ICD-10-CM | POA: Diagnosis not present

## 2023-05-04 DIAGNOSIS — T85848D Pain due to other internal prosthetic devices, implants and grafts, subsequent encounter: Secondary | ICD-10-CM

## 2023-05-04 DIAGNOSIS — C770 Secondary and unspecified malignant neoplasm of lymph nodes of head, face and neck: Secondary | ICD-10-CM | POA: Diagnosis not present

## 2023-05-04 DIAGNOSIS — G893 Neoplasm related pain (acute) (chronic): Secondary | ICD-10-CM | POA: Diagnosis not present

## 2023-05-04 DIAGNOSIS — Z5111 Encounter for antineoplastic chemotherapy: Secondary | ICD-10-CM | POA: Diagnosis not present

## 2023-05-04 DIAGNOSIS — Z79899 Other long term (current) drug therapy: Secondary | ICD-10-CM | POA: Diagnosis not present

## 2023-05-04 DIAGNOSIS — L539 Erythematous condition, unspecified: Secondary | ICD-10-CM | POA: Diagnosis not present

## 2023-05-04 DIAGNOSIS — C109 Malignant neoplasm of oropharynx, unspecified: Secondary | ICD-10-CM

## 2023-05-04 DIAGNOSIS — F329 Major depressive disorder, single episode, unspecified: Secondary | ICD-10-CM | POA: Diagnosis not present

## 2023-05-04 DIAGNOSIS — R1310 Dysphagia, unspecified: Secondary | ICD-10-CM | POA: Insufficient documentation

## 2023-05-04 DIAGNOSIS — C108 Malignant neoplasm of overlapping sites of oropharynx: Secondary | ICD-10-CM | POA: Diagnosis not present

## 2023-05-04 DIAGNOSIS — R059 Cough, unspecified: Secondary | ICD-10-CM | POA: Diagnosis not present

## 2023-05-04 DIAGNOSIS — R6889 Other general symptoms and signs: Secondary | ICD-10-CM | POA: Diagnosis not present

## 2023-05-04 DIAGNOSIS — Z510 Encounter for antineoplastic radiation therapy: Secondary | ICD-10-CM | POA: Diagnosis not present

## 2023-05-04 DIAGNOSIS — K1230 Oral mucositis (ulcerative), unspecified: Secondary | ICD-10-CM | POA: Diagnosis not present

## 2023-05-04 DIAGNOSIS — Z931 Gastrostomy status: Secondary | ICD-10-CM | POA: Diagnosis not present

## 2023-05-04 DIAGNOSIS — Z609 Problem related to social environment, unspecified: Secondary | ICD-10-CM

## 2023-05-04 DIAGNOSIS — Z5919 Other inadequate housing: Secondary | ICD-10-CM | POA: Diagnosis not present

## 2023-05-04 DIAGNOSIS — U099 Post covid-19 condition, unspecified: Secondary | ICD-10-CM | POA: Diagnosis not present

## 2023-05-04 DIAGNOSIS — J310 Chronic rhinitis: Secondary | ICD-10-CM | POA: Diagnosis not present

## 2023-05-04 DIAGNOSIS — Z8616 Personal history of COVID-19: Secondary | ICD-10-CM | POA: Diagnosis not present

## 2023-05-04 LAB — COMPREHENSIVE METABOLIC PANEL
ALT: 24 U/L (ref 0–44)
AST: 14 U/L — ABNORMAL LOW (ref 15–41)
Albumin: 2.8 g/dL — ABNORMAL LOW (ref 3.5–5.0)
Alkaline Phosphatase: 68 U/L (ref 38–126)
Anion gap: 6 (ref 5–15)
BUN: 17 mg/dL (ref 8–23)
CO2: 28 mmol/L (ref 22–32)
Calcium: 8.7 mg/dL — ABNORMAL LOW (ref 8.9–10.3)
Chloride: 98 mmol/L (ref 98–111)
Creatinine, Ser: 0.69 mg/dL (ref 0.61–1.24)
GFR, Estimated: >60 mL/min (ref >60)
Glucose, Bld: 134 mg/dL — ABNORMAL HIGH (ref 70–99)
Potassium: 3.8 mmol/L (ref 3.5–5.1)
Sodium: 132 mmol/L — ABNORMAL LOW (ref 135–145)
Total Bilirubin: 0.6 mg/dL (ref <1.2)
Total Protein: 6.1 g/dL — ABNORMAL LOW (ref 6.5–8.1)

## 2023-05-04 LAB — CBC WITH DIFFERENTIAL/PLATELET
Abs Immature Granulocytes: 0 10*3/uL (ref 0.00–0.07)
Band Neutrophils: 6 %
Basophils Absolute: 0 10*3/uL (ref 0.0–0.1)
Basophils Relative: 0 %
Eosinophils Absolute: 0 10*3/uL (ref 0.0–0.5)
Eosinophils Relative: 1 %
HCT: 33 % — ABNORMAL LOW (ref 39.0–52.0)
Hemoglobin: 10.8 g/dL — ABNORMAL LOW (ref 13.0–17.0)
Lymphocytes Relative: 11 %
Lymphs Abs: 0.2 10*3/uL — ABNORMAL LOW (ref 0.7–4.0)
MCH: 29.5 pg (ref 26.0–34.0)
MCHC: 32.7 g/dL (ref 30.0–36.0)
MCV: 90.2 fL (ref 80.0–100.0)
Monocytes Absolute: 0.2 10*3/uL (ref 0.1–1.0)
Monocytes Relative: 14 %
Neutro Abs: 1.3 10*3/uL — ABNORMAL LOW (ref 1.7–7.7)
Neutrophils Relative %: 68 %
Platelets: 163 10*3/uL (ref 150–400)
RBC: 3.66 MIL/uL — ABNORMAL LOW (ref 4.22–5.81)
RDW: 13.9 % (ref 11.5–15.5)
WBC: 1.7 10*3/uL — ABNORMAL LOW (ref 4.0–10.5)
nRBC: 0 % (ref 0.0–0.2)

## 2023-05-04 LAB — MAGNESIUM: Magnesium: 1.8 mg/dL (ref 1.7–2.4)

## 2023-05-04 MED ORDER — SODIUM CHLORIDE 0.9% FLUSH
10.0000 mL | INTRAVENOUS | Status: DC | PRN
  Administered 2023-05-04: 10 mL

## 2023-05-04 MED ORDER — DEXAMETHASONE SODIUM PHOSPHATE 10 MG/ML IJ SOLN
10.0000 mg | Freq: Once | INTRAMUSCULAR | Status: AC
Start: 2023-05-04 — End: 2023-05-04
  Administered 2023-05-04: 10 mg via INTRAVENOUS
  Filled 2023-05-04: qty 1

## 2023-05-04 MED ORDER — DIPHENHYDRAMINE HCL 50 MG/ML IJ SOLN
50.0000 mg | Freq: Once | INTRAMUSCULAR | Status: AC
  Administered 2023-05-04: 50 mg via INTRAVENOUS
  Filled 2023-05-04: qty 1

## 2023-05-04 MED ORDER — SODIUM CHLORIDE 0.9 % IV SOLN
270.0000 mg | Freq: Once | INTRAVENOUS | Status: AC
  Administered 2023-05-04: 270 mg via INTRAVENOUS
  Filled 2023-05-04: qty 27

## 2023-05-04 MED ORDER — HEPARIN SOD (PORK) LOCK FLUSH 100 UNIT/ML IV SOLN
500.0000 [IU] | Freq: Once | INTRAVENOUS | Status: AC | PRN
  Administered 2023-05-04: 500 [IU]

## 2023-05-04 MED ORDER — SODIUM CHLORIDE 0.9 % IV SOLN: Freq: Once | INTRAVENOUS | Status: AC

## 2023-05-04 MED ORDER — SODIUM CHLORIDE 0.9 % IV SOLN: 10.0000 mg | Freq: Once | INTRAVENOUS | Status: DC

## 2023-05-04 MED ORDER — FAMOTIDINE IN NACL 20-0.9 MG/50ML-% IV SOLN
20.0000 mg | Freq: Once | INTRAVENOUS | Status: AC
  Administered 2023-05-04: 20 mg via INTRAVENOUS
  Filled 2023-05-04: qty 50

## 2023-05-04 MED ORDER — PALONOSETRON HCL INJECTION 0.25 MG/5ML
0.2500 mg | Freq: Once | INTRAVENOUS | Status: AC
  Administered 2023-05-04: 0.25 mg via INTRAVENOUS
  Filled 2023-05-04: qty 5

## 2023-05-04 MED ORDER — SODIUM CHLORIDE 0.9 % IV SOLN
45.0000 mg/m2 | Freq: Once | INTRAVENOUS | Status: AC
  Administered 2023-05-04: 108 mg via INTRAVENOUS
  Filled 2023-05-04: qty 18

## 2023-05-04 NOTE — Progress Notes (Signed)
Nutrition Follow-up:  Patient with oropharyngeal cancer. He is receiving concurrent chemoradiation with weekly carboplatin + paclitaxel  (start 10/14). Patient is under the care of Dr. Anders Simmonds.   PEG - 10/9  Met with patient in infusion. He is wearing a mask today. States he had Covid 2-3 weeks ago and was instructed to wear this by ED provider. Patient coughing and spitting secretions in emesis bag through out visit. His throat is sore. Patient says he is not eating much by mouth. He is giving 4 cartons of tube feeding via tube. Patient unable to recall daily water via tube or by mouth. He denies nausea, vomiting, diarrhea, constipation. He is asking for a bag of food and additional Osmolite samples.    Medications: reviewed   Labs: Na 132, glucose 134, albumin 2.8  Anthropometrics: Wt 230 lb today increased from 227 lb 9.6 oz on 11/18  11/11 - 235 lb 3.2 oz  11/4 - 239 lb 9.6 oz  Estimated Energy Needs   Kcals: 2500-2800 Protein: 135-155 Fluid: >2.5 L   NUTRITION DIAGNOSIS: Inadequate oral intake -addressing with TF    INTERVENTION:  Ongoing PEG care and bolus feeding education. Recommend increasing tube feedings to goal as tolerated. This was reviewed with patient  Encouraged oral intake of soft moist foods high in calories and protein as tolerated Osmolite 1.5 samples provided - encouraged 6-7 cartons daily  One bag food from [facility] pantry provided    MONITORING, EVALUATION, GOAL: wt trends, intake, TF   NEXT VISIT: Thursday December 5 via telephone

## 2023-05-04 NOTE — Assessment & Plan Note (Signed)
-  Patient stated symptoms of depression on previous visits -Does not report any symptoms today

## 2023-05-04 NOTE — Assessment & Plan Note (Signed)
Patient lives in a house with bedbug infestation.  Reported transportation problems on last visit.  Is coordinating with insurance for transportation.

## 2023-05-04 NOTE — Assessment & Plan Note (Signed)
PEG tube site appears improved today.  No erythema and previous implant site looks healed. -Continue with current care regimen.

## 2023-05-04 NOTE — Assessment & Plan Note (Signed)
Difficulty swallowing persists, likely secondary to radiation therapy and cancer. Patient is unable to consume anything orally, including smooth foods like ice cream. -Continue use of the percutaneous endoscopic gastrostomy (PEG) tube for nutrition and hydration. -Encourage patient to continue flushing the PEG tube twice daily with water to maintain patency.

## 2023-05-04 NOTE — Patient Instructions (Signed)
[facility]  Discharge Instructions: Thank you for choosing [facility] to provide your oncology and hematology care.  If you have a lab appointment with the Cancer Center - please note that after April 8th, 2024, all labs will be drawn in the cancer center.  You do not have to check in or register with the main entrance as you have in the past but will complete your check-in in the cancer center.  Wear comfortable clothing and clothing appropriate for easy access to any Portacath or PICC line.   We strive to give you quality time with your provider. You may need to reschedule your appointment if you arrive late (15 or more minutes).  Arriving late affects you and other patients whose appointments are after yours.  Also, if you miss three or more appointments without notifying the office, you may be dismissed from the clinic at the provider's discretion.      For prescription refill requests, have your pharmacy contact our office and allow 72 hours for refills to be completed.    Today you received the following chemotherapy and/or immunotherapy agents Taxol & Carboplatin, return as scheduled.   To help prevent nausea and vomiting after your treatment, we encourage you to take your nausea medication as directed.  BELOW ARE SYMPTOMS THAT SHOULD BE REPORTED IMMEDIATELY: *FEVER GREATER THAN 100.4 F (38 C) OR HIGHER *CHILLS OR SWEATING *NAUSEA AND VOMITING THAT IS NOT CONTROLLED WITH YOUR NAUSEA MEDICATION *UNUSUAL SHORTNESS OF BREATH *UNUSUAL BRUISING OR BLEEDING *URINARY PROBLEMS (pain or burning when urinating, or frequent urination) *BOWEL PROBLEMS (unusual diarrhea, constipation, pain near the anus) TENDERNESS IN MOUTH AND THROAT WITH OR WITHOUT PRESENCE OF ULCERS (sore throat, sores in mouth, or a toothache) UNUSUAL RASH, SWELLING OR PAIN  UNUSUAL VAGINAL DISCHARGE OR ITCHING   Items with * indicate a potential  emergency and should be followed up as soon as possible or go to the Emergency Department if any problems should occur.  Please show the CHEMOTHERAPY ALERT CARD or IMMUNOTHERAPY ALERT CARD at check-in to the Emergency Department and triage nurse.  Should you have questions after your visit or need to cancel or reschedule your appointment, please contact [facility] - A DEPT OF [facility] [phone]  and follow the prompts.  Office hours are 8:00 a.m. to 4:30 p.m. Monday - Friday. Please note that voicemails left after 4:00 p.m. may not be returned until the following business day.  We are closed weekends and major holidays. You have access to a nurse at all times for urgent questions. Please call the main number to the clinic [phone] and follow the prompts.  For any non-urgent questions, you may also contact your provider using MyChart. We now offer e-Visits for anyone 76 and older to request care online for non-urgent symptoms. For details visit [email or web address].   Also download the MyChart app! Go to the app store, search "MyChart", open the app, select [facility], and log in with your MyChart username and password.

## 2023-05-04 NOTE — Progress Notes (Signed)
Patient Care Team: Eustaquio Boyden, MD as PCP - General (Family Medicine) Julio Sicks, MD as Consulting Physician (Neurosurgery) Jethro Bolus, MD (Inactive) as Consulting Physician (Urology) Kathyrn Sheriff, [facility] (Inactive) as Pharmacist (Pharmacist) [place], [place], [place] as Social Worker Cindie Crumbly, MD as Medical Oncologist (Medical Oncology) Therese Sarah, RN as Oncology Nurse Navigator (Medical Oncology) Juanell Fairly, RN as Case Manager  Clinic Day:  05/04/2023  Referring physician: Eustaquio Boyden, MD   CHIEF COMPLAINT:  CC: Oropharyngeal squamous cell carcinoma    ASSESSMENT & PLAN:   Assessment & Plan: Gregory Fisher  is a 72 y.o. 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 Undergoing radiation therapy with associated dysphagia. Patient is able to consume soft foods orally and is using a PEG tube for additional nutrition. -PET scan showed no evidence of metastatic disease -S/P PEG tube and port placement -Patient tolerating chemo RT C6D1 today. Baseline labs stable.  Held cycle 4 for pneumonia and PEG tube site infection. -Continue current treatment plan. -Labs and physical exam good today.  Will proceed with treatment today. -Patient will finish radiation treatment on 05/14/2023 -Repeat PET scan at 12 weeks after completing chemo RT  RTC in 8 weeks  Depression -Patient stated symptoms of depression on previous visits -Does not report any symptoms today   Dysphagia Difficulty swallowing persists, likely secondary to radiation therapy and cancer. Patient is unable to consume anything orally, including smooth foods like ice cream. -Continue use of the percutaneous endoscopic gastrostomy (PEG) tube for nutrition and hydration. -Encourage patient to continue flushing the  PEG tube twice daily with water to maintain patency.  Poor social situation Patient lives in a house with bedbug infestation.  Reported transportation problems on last visit.  Is coordinating with insurance for transportation.  Pain around PEG tube site PEG tube site appears improved today.  No erythema and previous implant site looks healed. -Continue with current care regimen.   The patient understands the plans discussed today and is in agreement with them.  He knows to contact our office if he develops concerns prior to his next appointment.  I provided 20 minutes of face-to-face time during this encounter and > 50% was spent counseling as documented under my assessment and plan.    Cindie Crumbly, MD  [facility] [address] [place] [address] Dept: [phone] Dept Fax: [phone]    ONCOLOGY HISTORY:   Oncology History Overview Note  T4N2Mx Squamous Cell Carcinoma of oro pharynx   Squamous cell carcinoma of oropharynx (HCC)  01/28/2023 Imaging   CT neck:  Pharynx and larynx: Enhancing mass in the right aspect of the pharynx, which measures approximately 3.3 x 3.1 x 5.0 cm. The mass appears centered in the oropharynx near the right palatine tonsil, but extends into the hypopharynx more than into the nasopharynx. The larynx is unremarkable.  Lymph nodes: Enlarged, abnormal lymph nodes bilaterally. Representative lymph nodes include a left level 2A lymph node with central low density, which measures 2.1 x 2.5 x 3.4 cm . A right level 2A lymph node also demonstrates central low density and measures 1.8 x 1.3 x 2.4 cm. A rounded right level 1B lymph node measures up to 1.1 cm (series 3, image 41), with some central low density. Abnormally enhancing right level 3 lymph node measures 1.3 x 1.9  x 2.2 cm.    01/29/2023 Pathology Results   A. OROPHARYNGEAL MASS INVOLVING TONSIL AND RIGHT PHARYNGEAL WALL, RIGHT,  BIOPSY:  Invasive squamous cell carcinoma, moderately differentiated.  P16 positive, HPV associated         01/29/2023 Procedure   Fiberoptic laryngoscopy with biopsy    01/29/2023 Tumor Marker   CARIS:  CPS: 10 Positive: p16, FGFR3 TMB low: 31mut/Mb NTRK1/2/3: Negative MSI: Stable   02/26/2023 Initial Diagnosis   Oropharyngeal carcinoma (HCC)   03/12/2023 PET scan   IMPRESSION: 1. The right base of tongue and pharyngeal mass is intensely FDG avid compatible with primary head and neck cancer. 2. There is additional tracer uptake within the left lateral wall of the oropharynx. This is nonspecific and may be inflammatory or neoplastic in etiology.  3. Bilateral tracer avid cervical lymph nodes are identified compatible with nodal metastasis. 4. There is a 6 mm nodule within the left midlung abutting the oblique fissure which demonstrates low level FDG uptake. Technically too small to reliably characterize by PET-CT. Attention on follow-up imaging advised. 5. Umbilical hernia contains nonobstructed loops of small bowel. 6. Coronary artery atherosclerotic calcifications.   03/18/2023 Procedure   IR guided port placement IR guided PEG tube placement   03/23/2023 -  Chemotherapy   Patient is on Treatment Plan : HEAD/NECK Carboplatin + Paclitaxel + XRT q7d     03/23/2023 -  Radiation Therapy   Started on radiation       Current Treatment: Chemo RT with carboplatin plus paclitaxel  INTERVAL HISTORY:  Discussed the use of AI scribe software for clinical note transcription with the patient, who gave verbal consent to proceed.   Gregory Fisher is here today for follow up. He is currently undergoing chemotherapy with radiation and has a PEG tube for nutrition. He reports a persistent cough, which has improved but still brings out a lot of phlegm. He also reports difficulty eating due to mouth sores, likely secondary to chemotherapy but is stable. Despite these issues, he feels his  condition has improved compared to previous weeks. He is able to manage feedings through the PEG tube.  He has no other complaints today.  Overall patient has been doing better. I have reviewed the past medical history, past surgical history, social history and family history with the patient and they are unchanged from previous note.  ALLERGIES:  has No Known Allergies.  MEDICATIONS:  Current Outpatient Medications  Medication Sig Dispense Refill   Accu-Chek Softclix Lancets lancets Use as instructed to check blood sugar 2 (times) a day 200 each 3   atorvastatin (LIPITOR) 80 MG tablet Take 1 tablet (80 mg total) by mouth daily. 100 tablet 0   benzonatate (TESSALON) 100 MG capsule Take 1 capsule (100 mg total) by mouth 3 (three) times daily as needed for cough. 20 capsule 0   Blood Glucose Monitoring Suppl (ACCU-CHEK GUIDE ME) w/Device KIT Use as instructed to check blood sugar 2 (times) a day 1 kit 0   CARBOPLATIN IV Inject into the vein once a week.     carvedilol (COREG) 12.5 MG tablet Take 1 tablet (12.5 mg total) by mouth 2 (two) times daily with a meal. 200 tablet 0   Cholecalciferol (VITAMIN D3) 25 MCG (1000 UT) CAPS Take 2 capsules (2,000 Units total) by mouth daily. 30 capsule    cloNIDine (CATAPRES) 0.1 MG tablet Take 1 tablet (0.1 mg total) by mouth 2 (two) times daily. 200 tablet 0   dapagliflozin propanediol (  FARXIGA) 5 MG TABS tablet Take 1 tablet (5 mg total) by mouth daily before breakfast. 90 tablet 3   dexamethasone (DECADRON) 4 MG tablet Take 2 tablets daily for 2 days, start the day after chemotherapy. Take with food. 30 tablet 1   furosemide (LASIX) 40 MG tablet Take 1 tablet (40 mg total) by mouth daily. 90 tablet 0   glucose blood (ACCU-CHEK GUIDE) test strip Use as instructed to check blood sugar 2 (times) a day 200 strip 3   lidocaine-prilocaine (EMLA) cream Apply a quarter-sized amount to port a cath site and cover with plastic wrap 1 hour prior to infusion  appointments 30 g 3   LORazepam (ATIVAN) 1 MG tablet TAKE 1 TABLET BY MOUTH EVERY DAY AS NEEDED FOR ANXIETY 20 tablet 0   metFORMIN (GLUCOPHAGE) 500 MG tablet Take 1 tablet (500 mg total) by mouth 2 (two) times daily with a meal. 200 tablet 0   ondansetron (ZOFRAN) 8 MG tablet Take 1 tablet (8 mg total) by mouth every 8 (eight) hours as needed for nausea or vomiting. Start on the third day after chemotherapy. 30 tablet 1   PACLitaxel (TAXOL IV) Inject into the vein once a week.     potassium chloride (KLOR-CON M10) 10 MEQ tablet Take 1 tablet (10 mEq total) by mouth 2 (two) times daily. 180 tablet 0   senna (SENOKOT) 8.6 MG TABS tablet Take 1 tablet (8.6 mg total) by mouth at bedtime. 120 tablet 0   sodium chloride (OCEAN) 0.65 % SOLN nasal spray Place 1 spray into both nostrils as needed. 30 mL 5   sucralfate (CARAFATE) 1 GM/10ML suspension Take 10 mLs (1 g total) by mouth 4 (four) times daily -  with meals and at bedtime. 420 mL 0   tamsulosin (FLOMAX) 0.4 MG CAPS capsule Take 2 capsules (0.8 mg total) by mouth at bedtime. 200 capsule 0   valsartan (DIOVAN) 320 MG tablet Take 0.5 tablets (160 mg total) by mouth daily. 45 tablet 0   No current facility-administered medications for this visit.   Facility-Administered Medications Ordered in Other Visits  Medication Dose Route Frequency Provider Last Rate Last Admin   sodium chloride flush (NS) 0.9 % injection 10 mL  10 mL Intracatheter PRN Cindie Crumbly, MD   10 mL at 05/04/23 1358     REVIEW OF SYSTEMS:   Constitutional: Denies fevers, chills or abnormal weight loss Eyes: Denies blurriness of vision Ears, nose, mouth, throat, and face: Denies mucositis or sore throat Respiratory: Denies cough, dyspnea or wheezes Cardiovascular: Denies palpitation, chest discomfort or lower extremity swelling Gastrointestinal:  Denies nausea, heartburn or change in bowel habits Skin: Denies abnormal skin rashes Lymphatics: Denies new lymphadenopathy or  easy bruising Neurological:Denies numbness, tingling or new weaknesses Behavioral/Psych: Mood is stable, no new changes  All other systems were reviewed with the patient and are negative.   VITALS:  There were no vitals taken for this visit.  Wt Readings from Last 3 Encounters:  05/04/23 230 lb (104.3 kg)  04/27/23 227 lb 9.6 oz (103.2 kg)  04/20/23 235 lb 3.2 oz (106.7 kg)     Performance status (ECOG):  2 - Symptomatic, <50% confined to bed   PHYSICAL EXAM:   GENERAL:alert, no distress and comfortable, patient is unkempt  OROPHARYNX: Mass not palpable anymore, nontender, no discharge, no ulceration noted.   NECK: supple, thyroid normal size, non-tender, no palpable lymph node.  LYMPH:  no palpable lymphadenopathy in tother cervical, axillary or inguinal LUNGS:  clear to auscultation and percussion with normal breathing effort, port in place- no erythema or discharge HEART: regular rate & rhythm and no murmurs and no lower extremity edema ABDOMEN:abdomen soft, non-tender and normal bowel sounds, PEG tube in place with dressing, no erythema around PEG tube site, hernia present Musculoskeletal:no cyanosis of digits and no clubbing  PSYCH: alert & oriented x 3 with fluent speech  LABORATORY DATA:  I have reviewed the data as listed     Component Value Date/Time   NA 132 (L) 05/04/2023 0810   NA 142 02/05/2012 0000   K 3.8 05/04/2023 0810   K 4.0 02/05/2012 0000   CL 98 05/04/2023 0810   CO2 28 05/04/2023 0810   GLUCOSE 134 (H) 05/04/2023 0810   BUN 17 05/04/2023 0810   CREATININE 0.69 05/04/2023 0810   CREATININE 1.0 02/05/2012 0000   CALCIUM 8.7 (L) 05/04/2023 0810   CALCIUM 10.2 02/05/2012 0000   PROT 6.1 (L) 05/04/2023 0810   ALBUMIN 2.8 (L) 05/04/2023 0810   AST 14 (L) 05/04/2023 0810   AST 21 02/05/2012 0000   ALT 24 05/04/2023 0810   ALKPHOS 68 05/04/2023 0810   ALKPHOS 62 02/05/2012 0000   BILITOT 0.6 05/04/2023 0810   BILITOT 0.7 02/05/2012 0000   GFRNONAA  >60 05/04/2023 0810   GFRAA >60 11/21/2016 1100    Lab Results  Component Value Date   WBC 1.7 (L) 05/04/2023   NEUTROABS 1.3 (L) 05/04/2023   HGB 10.8 (L) 05/04/2023   HCT 33.0 (L) 05/04/2023   MCV 90.2 05/04/2023   PLT 163 05/04/2023      Chemistry      Component Value Date/Time   NA 132 (L) 05/04/2023 0810   NA 142 02/05/2012 0000   K 3.8 05/04/2023 0810   K 4.0 02/05/2012 0000   CL 98 05/04/2023 0810   CO2 28 05/04/2023 0810   BUN 17 05/04/2023 0810   CREATININE 0.69 05/04/2023 0810   CREATININE 1.0 02/05/2012 0000      Component Value Date/Time   CALCIUM 8.7 (L) 05/04/2023 0810   CALCIUM 10.2 02/05/2012 0000   ALKPHOS 68 05/04/2023 0810   ALKPHOS 62 02/05/2012 0000   AST 14 (L) 05/04/2023 0810   AST 21 02/05/2012 0000   ALT 24 05/04/2023 0810   BILITOT 0.6 05/04/2023 0810   BILITOT 0.7 02/05/2012 0000

## 2023-05-04 NOTE — Patient Instructions (Signed)
VISIT SUMMARY:  During today's visit, we discussed your ongoing treatment for throat cancer, including your recent symptoms of coughing up phlegm and a fever blister in your mouth. We also addressed your difficulty swallowing, the condition of your PEG tube site, and your recent COVID-19 diagnosis. Your treatment plan is progressing well, with a significant reduction in tumor size noted.  YOUR PLAN:  -THROAT CANCER: Throat cancer is a disease where malignant cells form in the tissues of the throat. Your tumor has significantly reduced in size, indicating a positive response to your ongoing radiation and chemotherapy. Continue with your current treatment plan. If radiation extends into the following week, you will receive an additional cycle of chemotherapy. If radiation ends this week, you will skip the additional chemotherapy cycle.  -DYSPHAGIA: Dysphagia is difficulty swallowing, which in your case is likely due to radiation therapy. You should continue using the PEG tube for nutrition and hydration and flush it twice daily with water to keep it clear.  -ORAL BLISTERS: Oral blisters are sores in the mouth, likely caused by radiation therapy. Continue with your current oral care regimen to manage this issue.  -PEG TUBE SITE: The PEG tube site is where the feeding tube enters your body. It appears clean, so continue with your current cleaning regimen to keep it that way.  INSTRUCTIONS:  We will reassess your condition and response to treatment after you complete your radiation therapy.

## 2023-05-04 NOTE — Progress Notes (Signed)
Patient presents today for Taxol/Carbo. ANC 1.3 Dr. Anders Simmonds made aware, okay to proceed with treatment today. Patient tolerated chemotherapy with no complaints voiced. Side effects with management reviewed understanding verbalized. Port site clean and dry with no bruising or swelling noted at site. Good blood return noted before and after administration of chemotherapy. Band aid applied. Patient left in satisfactory condition with VSS and no s/s of distress noted.

## 2023-05-04 NOTE — Assessment & Plan Note (Addendum)
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 Undergoing radiation therapy with associated dysphagia. Patient is able to consume soft foods orally and is using a PEG tube for additional nutrition. -PET scan showed no evidence of metastatic disease -S/P PEG tube and port placement -Patient tolerating chemo RT C6D1 today. Baseline labs stable.  Held cycle 4 for pneumonia and PEG tube site infection. -Continue current treatment plan. -Labs and physical exam good today.  Will proceed with treatment today. -Patient will finish radiation treatment on 05/14/2023 -Repeat PET scan at 12 weeks after completing chemo RT  RTC in 8 weeks

## 2023-05-04 NOTE — Patient Outreach (Signed)
  Care Management   Outreach Note  05/04/2023 Name: Gregory Fisher MRN: [id number] DOB: 12/24/1950  An unsuccessful telephone outreach was attempted today to contact the patient about Care Management needs.     Follow Up Plan:  A HIPAA compliant phone message was left for the patient providing contact information and requesting a return call.    [facility]  [facility], [facility] RN Care Manager Direct Dial: [phone] Website: [email or web address]

## 2023-05-05 ENCOUNTER — Encounter: Payer: Self-pay | Admitting: Oncology

## 2023-05-06 DIAGNOSIS — Z931 Gastrostomy status: Secondary | ICD-10-CM | POA: Diagnosis not present

## 2023-05-06 DIAGNOSIS — C108 Malignant neoplasm of overlapping sites of oropharynx: Secondary | ICD-10-CM | POA: Diagnosis not present

## 2023-05-06 DIAGNOSIS — C770 Secondary and unspecified malignant neoplasm of lymph nodes of head, face and neck: Secondary | ICD-10-CM | POA: Diagnosis not present

## 2023-05-06 DIAGNOSIS — Z8616 Personal history of COVID-19: Secondary | ICD-10-CM | POA: Diagnosis not present

## 2023-05-06 DIAGNOSIS — Z79899 Other long term (current) drug therapy: Secondary | ICD-10-CM | POA: Diagnosis not present

## 2023-05-06 DIAGNOSIS — K1230 Oral mucositis (ulcerative), unspecified: Secondary | ICD-10-CM | POA: Diagnosis not present

## 2023-05-06 DIAGNOSIS — Z510 Encounter for antineoplastic radiation therapy: Secondary | ICD-10-CM | POA: Diagnosis not present

## 2023-05-06 DIAGNOSIS — L539 Erythematous condition, unspecified: Secondary | ICD-10-CM | POA: Diagnosis not present

## 2023-05-11 ENCOUNTER — Ambulatory Visit (INDEPENDENT_AMBULATORY_CARE_PROVIDER_SITE_OTHER): Payer: Medicare HMO | Admitting: Otolaryngology

## 2023-05-11 DIAGNOSIS — Z931 Gastrostomy status: Secondary | ICD-10-CM | POA: Diagnosis not present

## 2023-05-11 DIAGNOSIS — R6889 Other general symptoms and signs: Secondary | ICD-10-CM | POA: Diagnosis not present

## 2023-05-11 DIAGNOSIS — Z510 Encounter for antineoplastic radiation therapy: Secondary | ICD-10-CM | POA: Diagnosis not present

## 2023-05-11 DIAGNOSIS — K1230 Oral mucositis (ulcerative), unspecified: Secondary | ICD-10-CM | POA: Diagnosis not present

## 2023-05-11 DIAGNOSIS — C770 Secondary and unspecified malignant neoplasm of lymph nodes of head, face and neck: Secondary | ICD-10-CM | POA: Diagnosis not present

## 2023-05-11 DIAGNOSIS — C108 Malignant neoplasm of overlapping sites of oropharynx: Secondary | ICD-10-CM | POA: Diagnosis not present

## 2023-05-11 DIAGNOSIS — Z8616 Personal history of COVID-19: Secondary | ICD-10-CM | POA: Diagnosis not present

## 2023-05-11 DIAGNOSIS — Z79899 Other long term (current) drug therapy: Secondary | ICD-10-CM | POA: Diagnosis not present

## 2023-05-11 DIAGNOSIS — L539 Erythematous condition, unspecified: Secondary | ICD-10-CM | POA: Diagnosis not present

## 2023-05-12 ENCOUNTER — Inpatient Hospital Stay: Payer: Medicare HMO

## 2023-05-12 ENCOUNTER — Inpatient Hospital Stay: Payer: Medicare HMO | Attending: Oncology

## 2023-05-12 DIAGNOSIS — Z510 Encounter for antineoplastic radiation therapy: Secondary | ICD-10-CM | POA: Diagnosis not present

## 2023-05-12 DIAGNOSIS — L539 Erythematous condition, unspecified: Secondary | ICD-10-CM | POA: Diagnosis not present

## 2023-05-12 DIAGNOSIS — Z79899 Other long term (current) drug therapy: Secondary | ICD-10-CM | POA: Diagnosis not present

## 2023-05-12 DIAGNOSIS — C770 Secondary and unspecified malignant neoplasm of lymph nodes of head, face and neck: Secondary | ICD-10-CM | POA: Diagnosis not present

## 2023-05-12 DIAGNOSIS — Z8616 Personal history of COVID-19: Secondary | ICD-10-CM | POA: Diagnosis not present

## 2023-05-12 DIAGNOSIS — K1230 Oral mucositis (ulcerative), unspecified: Secondary | ICD-10-CM | POA: Diagnosis not present

## 2023-05-12 DIAGNOSIS — C108 Malignant neoplasm of overlapping sites of oropharynx: Secondary | ICD-10-CM | POA: Diagnosis not present

## 2023-05-12 DIAGNOSIS — Z931 Gastrostomy status: Secondary | ICD-10-CM | POA: Diagnosis not present

## 2023-05-13 DIAGNOSIS — C770 Secondary and unspecified malignant neoplasm of lymph nodes of head, face and neck: Secondary | ICD-10-CM | POA: Diagnosis not present

## 2023-05-13 DIAGNOSIS — R234 Changes in skin texture: Secondary | ICD-10-CM | POA: Diagnosis not present

## 2023-05-13 DIAGNOSIS — K1230 Oral mucositis (ulcerative), unspecified: Secondary | ICD-10-CM | POA: Diagnosis not present

## 2023-05-13 DIAGNOSIS — R1310 Dysphagia, unspecified: Secondary | ICD-10-CM | POA: Diagnosis not present

## 2023-05-13 DIAGNOSIS — C108 Malignant neoplasm of overlapping sites of oropharynx: Secondary | ICD-10-CM | POA: Diagnosis not present

## 2023-05-13 DIAGNOSIS — K1233 Oral mucositis (ulcerative) due to radiation: Secondary | ICD-10-CM | POA: Diagnosis not present

## 2023-05-13 DIAGNOSIS — I890 Lymphedema, not elsewhere classified: Secondary | ICD-10-CM | POA: Diagnosis not present

## 2023-05-13 DIAGNOSIS — Z931 Gastrostomy status: Secondary | ICD-10-CM | POA: Diagnosis not present

## 2023-05-13 DIAGNOSIS — K429 Umbilical hernia without obstruction or gangrene: Secondary | ICD-10-CM | POA: Diagnosis not present

## 2023-05-13 DIAGNOSIS — L539 Erythematous condition, unspecified: Secondary | ICD-10-CM | POA: Diagnosis not present

## 2023-05-13 DIAGNOSIS — Z510 Encounter for antineoplastic radiation therapy: Secondary | ICD-10-CM | POA: Diagnosis not present

## 2023-05-13 DIAGNOSIS — Z79899 Other long term (current) drug therapy: Secondary | ICD-10-CM | POA: Diagnosis not present

## 2023-05-13 DIAGNOSIS — R6889 Other general symptoms and signs: Secondary | ICD-10-CM | POA: Diagnosis not present

## 2023-05-13 DIAGNOSIS — I5032 Chronic diastolic (congestive) heart failure: Secondary | ICD-10-CM | POA: Diagnosis not present

## 2023-05-13 DIAGNOSIS — Z8616 Personal history of COVID-19: Secondary | ICD-10-CM | POA: Diagnosis not present

## 2023-05-14 ENCOUNTER — Telehealth: Payer: Self-pay | Admitting: Dietician

## 2023-05-14 ENCOUNTER — Inpatient Hospital Stay: Payer: Medicare HMO | Admitting: Dietician

## 2023-05-14 ENCOUNTER — Inpatient Hospital Stay: Payer: Medicare HMO | Admitting: Licensed Clinical Social Worker

## 2023-05-14 DIAGNOSIS — Z510 Encounter for antineoplastic radiation therapy: Secondary | ICD-10-CM | POA: Diagnosis not present

## 2023-05-14 DIAGNOSIS — Z8616 Personal history of COVID-19: Secondary | ICD-10-CM | POA: Diagnosis not present

## 2023-05-14 DIAGNOSIS — L539 Erythematous condition, unspecified: Secondary | ICD-10-CM | POA: Diagnosis not present

## 2023-05-14 DIAGNOSIS — R6889 Other general symptoms and signs: Secondary | ICD-10-CM | POA: Diagnosis not present

## 2023-05-14 DIAGNOSIS — C770 Secondary and unspecified malignant neoplasm of lymph nodes of head, face and neck: Secondary | ICD-10-CM | POA: Diagnosis not present

## 2023-05-14 DIAGNOSIS — K1230 Oral mucositis (ulcerative), unspecified: Secondary | ICD-10-CM | POA: Diagnosis not present

## 2023-05-14 DIAGNOSIS — Z79899 Other long term (current) drug therapy: Secondary | ICD-10-CM | POA: Diagnosis not present

## 2023-05-14 DIAGNOSIS — C108 Malignant neoplasm of overlapping sites of oropharynx: Secondary | ICD-10-CM | POA: Diagnosis not present

## 2023-05-14 DIAGNOSIS — C109 Malignant neoplasm of oropharynx, unspecified: Secondary | ICD-10-CM

## 2023-05-14 DIAGNOSIS — Z931 Gastrostomy status: Secondary | ICD-10-CM | POA: Diagnosis not present

## 2023-05-14 NOTE — Telephone Encounter (Signed)
Nutrition Follow-up:  Patient with oropharyngeal cancer. He is receiving concurrent chemoradiation with weekly carboplatin + paclitaxel. Final chemo completed 11/25. Patient is under the care of Dr. Anders Simmonds.    PEG - 10/9  Spoke with patient via telephone. Patient unable to eat or drink by mouth. Says he has sores or something. Patient will have radiation look at this today. He reports giving 3 cartons of Osmolite this morning and did 3 yesterday. Patient is tolerating these well. He denies nausea, vomiting, diarrhea, constipation. Patient is relying on transportation to get to appointments as his truck is broken down. He is asking how he can get additional samples and a bag of food.    Medications: reviewed   Labs: Na 146, BUN 38, Cr 1.31  Anthropometrics: Last wt 230 lb on 11/25  11/18 - 227 lb 9.6 oz   11/11 - 235 lb 3.2 oz  11/4 - 239 lb 9.6 oz  Estimated Energy Needs   Kcals: 2500-2800 Protein: 135-155 Fluid: >2.5 L   NUTRITION DIAGNOSIS: Inadequate oral intake -addressing with TF   INTERVENTION:  Continue tube feedings, pt is agreeable to increase to 6 cartons Osmolite/day Encouraged oral intake of soft smooth textures as able Samples of Osmolite, Ensure, and bag of food taken to [employer] - pt will receive this today at his appointment (pt aware and appreciative) Congratulated pt on completing treatment      MONITORING, EVALUATION, GOAL: wt trends, intake, TF   NEXT VISIT: Monday December 9 via telephone

## 2023-05-14 NOTE — Progress Notes (Signed)
[facility] CSW Progress Note  Clinical Child psychotherapist  informed pt does not have transportation at this time and is need of supplements for his PEG.  Pt has completed chemotherapy and is scheduled for his last radiation treatment at [facility] for which they have arranged transportation.   CSW brought supplements to [facility] to be given to pt following his last radiation treatment.  CSW to remain available as appropriate.        Rachel Moulds, LCSW Clinical Social Worker [facility]

## 2023-05-15 DIAGNOSIS — R6889 Other general symptoms and signs: Secondary | ICD-10-CM | POA: Diagnosis not present

## 2023-05-15 DIAGNOSIS — Z931 Gastrostomy status: Secondary | ICD-10-CM | POA: Diagnosis not present

## 2023-05-15 DIAGNOSIS — Z8616 Personal history of COVID-19: Secondary | ICD-10-CM | POA: Diagnosis not present

## 2023-05-15 DIAGNOSIS — L539 Erythematous condition, unspecified: Secondary | ICD-10-CM | POA: Diagnosis not present

## 2023-05-15 DIAGNOSIS — Z79899 Other long term (current) drug therapy: Secondary | ICD-10-CM | POA: Diagnosis not present

## 2023-05-15 DIAGNOSIS — C770 Secondary and unspecified malignant neoplasm of lymph nodes of head, face and neck: Secondary | ICD-10-CM | POA: Diagnosis not present

## 2023-05-15 DIAGNOSIS — K1230 Oral mucositis (ulcerative), unspecified: Secondary | ICD-10-CM | POA: Diagnosis not present

## 2023-05-15 DIAGNOSIS — R1310 Dysphagia, unspecified: Secondary | ICD-10-CM | POA: Diagnosis not present

## 2023-05-15 DIAGNOSIS — R234 Changes in skin texture: Secondary | ICD-10-CM | POA: Diagnosis not present

## 2023-05-15 DIAGNOSIS — Z510 Encounter for antineoplastic radiation therapy: Secondary | ICD-10-CM | POA: Diagnosis not present

## 2023-05-15 DIAGNOSIS — C108 Malignant neoplasm of overlapping sites of oropharynx: Secondary | ICD-10-CM | POA: Diagnosis not present

## 2023-05-15 DIAGNOSIS — K1233 Oral mucositis (ulcerative) due to radiation: Secondary | ICD-10-CM | POA: Diagnosis not present

## 2023-05-18 ENCOUNTER — Inpatient Hospital Stay: Payer: Medicare HMO | Admitting: Dietician

## 2023-05-18 ENCOUNTER — Telehealth: Payer: Self-pay | Admitting: Dietician

## 2023-05-18 NOTE — Telephone Encounter (Signed)
Attempted to contact patient via telephone for nutrition follow-up. Patient did not answer x 2 attempts. VM not set up. Unable to leave message.

## 2023-05-25 ENCOUNTER — Telehealth: Payer: Self-pay

## 2023-05-25 NOTE — Telephone Encounter (Signed)
Sending note to Dr Sharen Hones and Sharen Hones pool.

## 2023-05-25 NOTE — Telephone Encounter (Signed)
Noted. Sorry to hear this. My sincere condolences to the family and friends.

## 2023-05-26 NOTE — Telephone Encounter (Signed)
[facility] [employer] called to see if Dr. Reece Agar can sign the pt's death certificate? Funeral home states the pt's date of death was 06/07/2023. Funeral home states certificate is on the [facility] website. Funeral home states the family has decided to cremate the pt & wishes the have the cremation asap. Call back # [phone]

## 2023-05-26 NOTE — Telephone Encounter (Addendum)
Plz notify funeral home death certificate has been completed.  Will forward to his oncologist as fyi.

## 2023-05-27 NOTE — Telephone Encounter (Signed)
Spoke with [employer] [employer] notifying them Dr Reece Agar has completed death certificate. Expresses their thanks.

## 2023-06-01 ENCOUNTER — Ambulatory Visit (INDEPENDENT_AMBULATORY_CARE_PROVIDER_SITE_OTHER): Payer: Medicare HMO | Admitting: Otolaryngology

## 2023-06-10 DEATH — deceased

## 2023-07-03 ENCOUNTER — Encounter: Payer: Medicare HMO | Admitting: Family Medicine

## 2023-07-06 ENCOUNTER — Other Ambulatory Visit: Payer: Self-pay | Admitting: Family Medicine

## 2023-07-06 ENCOUNTER — Inpatient Hospital Stay: Payer: Medicare HMO | Admitting: Oncology

## 2023-07-06 ENCOUNTER — Inpatient Hospital Stay: Payer: Medicare HMO

## 2023-07-06 DIAGNOSIS — I10 Essential (primary) hypertension: Secondary | ICD-10-CM

## 2023-08-06 ENCOUNTER — Other Ambulatory Visit (HOSPITAL_COMMUNITY): Payer: Medicare HMO
